# Patient Record
Sex: Male | Born: 1950 | Race: Black or African American | Hispanic: No | Marital: Married | Smoking: Never smoker
Health system: Southern US, Community
[De-identification: ages and names within clinical notes are randomized; demographics above are authoritative.]

## PROBLEM LIST (undated history)

## (undated) DIAGNOSIS — Z889 Allergy status to unspecified drugs, medicaments and biological substances status: Secondary | ICD-10-CM

## (undated) DIAGNOSIS — R42 Dizziness and giddiness: Secondary | ICD-10-CM

## (undated) DIAGNOSIS — F419 Anxiety disorder, unspecified: Secondary | ICD-10-CM

## (undated) DIAGNOSIS — I10 Essential (primary) hypertension: Secondary | ICD-10-CM

## (undated) DIAGNOSIS — R569 Unspecified convulsions: Secondary | ICD-10-CM

## (undated) HISTORY — DX: Dizziness and giddiness: R42

## (undated) HISTORY — DX: Anxiety disorder, unspecified: F41.9

## (undated) HISTORY — DX: Essential (primary) hypertension: I10

## (undated) HISTORY — DX: Allergy status to unspecified drugs, medicaments and biological substances: Z88.9

---

## 1997-12-03 ENCOUNTER — Encounter: Admission: RE | Admit: 1997-12-03 | Discharge: 1997-12-03 | Payer: Self-pay | Admitting: Family Medicine

## 1998-02-16 ENCOUNTER — Encounter: Admission: RE | Admit: 1998-02-16 | Discharge: 1998-02-16 | Payer: Self-pay | Admitting: Family Medicine

## 1998-11-20 ENCOUNTER — Encounter: Admission: RE | Admit: 1998-11-20 | Discharge: 1998-11-20 | Payer: Self-pay | Admitting: Family Medicine

## 1998-12-20 ENCOUNTER — Emergency Department (HOSPITAL_COMMUNITY): Admission: EM | Admit: 1998-12-20 | Discharge: 1998-12-20 | Payer: Self-pay | Admitting: Emergency Medicine

## 1998-12-22 ENCOUNTER — Encounter: Admission: RE | Admit: 1998-12-22 | Discharge: 1998-12-22 | Payer: Self-pay | Admitting: Sports Medicine

## 2000-04-16 ENCOUNTER — Emergency Department (HOSPITAL_COMMUNITY): Admission: EM | Admit: 2000-04-16 | Discharge: 2000-04-16 | Payer: Self-pay | Admitting: Emergency Medicine

## 2000-06-28 ENCOUNTER — Encounter: Payer: Self-pay | Admitting: Emergency Medicine

## 2000-06-28 ENCOUNTER — Emergency Department (HOSPITAL_COMMUNITY): Admission: EM | Admit: 2000-06-28 | Discharge: 2000-06-28 | Payer: Self-pay | Admitting: Emergency Medicine

## 2003-10-18 ENCOUNTER — Emergency Department (HOSPITAL_COMMUNITY): Admission: EM | Admit: 2003-10-18 | Discharge: 2003-10-18 | Payer: Self-pay | Admitting: *Deleted

## 2004-10-11 ENCOUNTER — Encounter: Admission: RE | Admit: 2004-10-11 | Discharge: 2004-10-11 | Payer: Self-pay | Admitting: Cardiology

## 2006-09-07 ENCOUNTER — Emergency Department: Payer: Self-pay

## 2006-11-03 ENCOUNTER — Emergency Department (HOSPITAL_COMMUNITY): Admission: EM | Admit: 2006-11-03 | Discharge: 2006-11-03 | Payer: Self-pay | Admitting: Emergency Medicine

## 2006-11-10 ENCOUNTER — Encounter: Admission: RE | Admit: 2006-11-10 | Discharge: 2006-11-10 | Payer: Self-pay | Admitting: Family Medicine

## 2007-01-01 ENCOUNTER — Ambulatory Visit: Payer: Self-pay | Admitting: Internal Medicine

## 2007-01-17 ENCOUNTER — Ambulatory Visit: Payer: Self-pay | Admitting: Internal Medicine

## 2008-11-01 ENCOUNTER — Emergency Department (HOSPITAL_COMMUNITY): Admission: EM | Admit: 2008-11-01 | Discharge: 2008-11-02 | Payer: Self-pay | Admitting: Emergency Medicine

## 2009-06-04 ENCOUNTER — Emergency Department (HOSPITAL_COMMUNITY): Admission: EM | Admit: 2009-06-04 | Discharge: 2009-06-04 | Payer: Self-pay | Admitting: Emergency Medicine

## 2009-07-03 ENCOUNTER — Emergency Department (HOSPITAL_COMMUNITY): Admission: EM | Admit: 2009-07-03 | Discharge: 2009-07-03 | Payer: Self-pay | Admitting: Family Medicine

## 2010-03-21 ENCOUNTER — Emergency Department (HOSPITAL_COMMUNITY): Admission: EM | Admit: 2010-03-21 | Discharge: 2010-03-21 | Payer: Self-pay | Admitting: Emergency Medicine

## 2010-09-09 LAB — URINALYSIS, ROUTINE W REFLEX MICROSCOPIC
Bilirubin Urine: NEGATIVE
Glucose, UA: NEGATIVE mg/dL
Hgb urine dipstick: NEGATIVE
Specific Gravity, Urine: 1.006 (ref 1.005–1.030)

## 2010-09-09 LAB — CBC
HCT: 43.8 % (ref 39.0–52.0)
MCHC: 33.8 g/dL (ref 30.0–36.0)
Platelets: 212 10*3/uL (ref 150–400)
RDW: 14.1 % (ref 11.5–15.5)

## 2010-09-09 LAB — BASIC METABOLIC PANEL
BUN: 11 mg/dL (ref 6–23)
Calcium: 8.7 mg/dL (ref 8.4–10.5)
Creatinine, Ser: 0.96 mg/dL (ref 0.4–1.5)
GFR calc non Af Amer: >60 mL/min (ref >60)
Glucose, Bld: 115 mg/dL — ABNORMAL HIGH (ref 70–99)

## 2010-09-09 LAB — URINE CULTURE
Colony Count: NO GROWTH
Culture  Setup Time: 201109260016
Culture: NO GROWTH

## 2010-09-09 LAB — POCT CARDIAC MARKERS: CKMB, poc: <1 ng/mL — ABNORMAL LOW (ref 1.0–8.0)

## 2010-09-09 LAB — DIFFERENTIAL
Basophils Absolute: 0 10*3/uL (ref 0.0–0.1)
Basophils Relative: 0 % (ref 0–1)
Monocytes Absolute: 0.6 10*3/uL (ref 0.1–1.0)
Neutro Abs: 6.2 10*3/uL (ref 1.7–7.7)
Neutrophils Relative %: 71 % (ref 43–77)

## 2010-09-28 LAB — DIFFERENTIAL
Basophils Absolute: 0 10*3/uL (ref 0.0–0.1)
Basophils Relative: 0 % (ref 0–1)
Eosinophils Absolute: 0.4 10*3/uL (ref 0.0–0.7)
Eosinophils Relative: 3 % (ref 0–5)
Lymphocytes Relative: 7 % — ABNORMAL LOW (ref 12–46)

## 2010-09-28 LAB — CBC
HCT: 47.6 % (ref 39.0–52.0)
MCV: 82.7 fL (ref 78.0–100.0)
Platelets: 259 10*3/uL (ref 150–400)
RDW: 14.9 % (ref 11.5–15.5)

## 2010-09-28 LAB — SEDIMENTATION RATE: Sed Rate: 6 mm/hr (ref 0–16)

## 2010-09-28 LAB — BASIC METABOLIC PANEL
BUN: 18 mg/dL (ref 6–23)
GFR calc non Af Amer: 59 mL/min — ABNORMAL LOW (ref >60)
Glucose, Bld: 101 mg/dL — ABNORMAL HIGH (ref 70–99)
Potassium: 3.7 mEq/L (ref 3.5–5.1)

## 2010-10-05 LAB — POCT CARDIAC MARKERS
Myoglobin, poc: 33.2 ng/mL (ref 12–200)
Troponin i, poc: <0.05 ng/mL (ref 0.00–0.09)

## 2010-10-05 LAB — POCT I-STAT, CHEM 8
BUN: 12 mg/dL (ref 6–23)
Chloride: 103 mEq/L (ref 96–112)
HCT: 45 % (ref 39.0–52.0)
Sodium: 138 mEq/L (ref 135–145)
TCO2: 25 mmol/L (ref 0–100)

## 2010-10-05 LAB — DIFFERENTIAL
Basophils Absolute: 0 10*3/uL (ref 0.0–0.1)
Lymphocytes Relative: 22 % (ref 12–46)
Neutro Abs: 3.4 10*3/uL (ref 1.7–7.7)

## 2010-10-05 LAB — CBC
Platelets: 220 10*3/uL (ref 150–400)
RDW: 14.1 % (ref 11.5–15.5)

## 2010-11-02 ENCOUNTER — Emergency Department (HOSPITAL_COMMUNITY): Payer: BC Managed Care – PPO

## 2010-11-02 ENCOUNTER — Emergency Department (HOSPITAL_COMMUNITY)
Admission: EM | Admit: 2010-11-02 | Discharge: 2010-11-02 | Disposition: A | Discharge status: Home / Self Care | Payer: BC Managed Care – PPO | Attending: Emergency Medicine | Admitting: Emergency Medicine

## 2010-11-02 DIAGNOSIS — I10 Essential (primary) hypertension: Secondary | ICD-10-CM | POA: Insufficient documentation

## 2010-11-02 DIAGNOSIS — Z79899 Other long term (current) drug therapy: Secondary | ICD-10-CM | POA: Insufficient documentation

## 2010-11-02 DIAGNOSIS — R0989 Other specified symptoms and signs involving the circulatory and respiratory systems: Secondary | ICD-10-CM | POA: Insufficient documentation

## 2010-11-02 DIAGNOSIS — E7800 Pure hypercholesterolemia, unspecified: Secondary | ICD-10-CM | POA: Insufficient documentation

## 2010-11-02 DIAGNOSIS — R42 Dizziness and giddiness: Secondary | ICD-10-CM | POA: Insufficient documentation

## 2010-11-02 LAB — URINALYSIS, ROUTINE W REFLEX MICROSCOPIC
Glucose, UA: NEGATIVE mg/dL
Ketones, ur: NEGATIVE mg/dL
Specific Gravity, Urine: 1.008 (ref 1.005–1.030)
pH: 7 (ref 5.0–8.0)

## 2010-11-02 LAB — DIFFERENTIAL
Basophils Absolute: 0 10*3/uL (ref 0.0–0.1)
Eosinophils Absolute: 0.1 10*3/uL (ref 0.0–0.7)
Eosinophils Relative: 2 % (ref 0–5)
Lymphocytes Relative: 22 % (ref 12–46)
Monocytes Absolute: 0.7 10*3/uL (ref 0.1–1.0)

## 2010-11-02 LAB — CBC
HCT: 45.4 % (ref 39.0–52.0)
MCHC: 34.1 g/dL (ref 30.0–36.0)
Platelets: 216 10*3/uL (ref 150–400)
RDW: 13.9 % (ref 11.5–15.5)

## 2010-11-02 LAB — BASIC METABOLIC PANEL
BUN: 12 mg/dL (ref 6–23)
Calcium: 9.5 mg/dL (ref 8.4–10.5)
GFR calc non Af Amer: >60 mL/min (ref >60)
Glucose, Bld: 101 mg/dL — ABNORMAL HIGH (ref 70–99)
Sodium: 137 mEq/L (ref 135–145)

## 2011-01-21 DIAGNOSIS — Z886 Allergy status to analgesic agent status: Secondary | ICD-10-CM | POA: Insufficient documentation

## 2011-01-21 DIAGNOSIS — R069 Unspecified abnormalities of breathing: Secondary | ICD-10-CM | POA: Insufficient documentation

## 2011-06-11 ENCOUNTER — Encounter: Payer: Self-pay | Admitting: Emergency Medicine

## 2011-06-11 ENCOUNTER — Emergency Department (HOSPITAL_COMMUNITY)
Admission: EM | Admit: 2011-06-11 | Discharge: 2011-06-11 | Disposition: A | Discharge status: Home / Self Care | Payer: BC Managed Care – PPO | Attending: Emergency Medicine | Admitting: Emergency Medicine

## 2011-06-11 ENCOUNTER — Other Ambulatory Visit: Payer: Self-pay

## 2011-06-11 ENCOUNTER — Emergency Department (HOSPITAL_COMMUNITY): Payer: BC Managed Care – PPO

## 2011-06-11 DIAGNOSIS — R002 Palpitations: Secondary | ICD-10-CM | POA: Insufficient documentation

## 2011-06-11 DIAGNOSIS — R141 Gas pain: Secondary | ICD-10-CM | POA: Insufficient documentation

## 2011-06-11 DIAGNOSIS — R142 Eructation: Secondary | ICD-10-CM | POA: Insufficient documentation

## 2011-06-11 DIAGNOSIS — Z79899 Other long term (current) drug therapy: Secondary | ICD-10-CM | POA: Insufficient documentation

## 2011-06-11 DIAGNOSIS — R143 Flatulence: Secondary | ICD-10-CM | POA: Insufficient documentation

## 2011-06-11 DIAGNOSIS — I10 Essential (primary) hypertension: Secondary | ICD-10-CM

## 2011-06-11 DIAGNOSIS — R0602 Shortness of breath: Secondary | ICD-10-CM | POA: Insufficient documentation

## 2011-06-11 HISTORY — DX: Essential (primary) hypertension: I10

## 2011-06-11 LAB — CBC
Hemoglobin: 14.7 g/dL (ref 13.0–17.0)
MCHC: 34.4 g/dL (ref 30.0–36.0)
RDW: 14.3 % (ref 11.5–15.5)
WBC: 6.3 10*3/uL (ref 4.0–10.5)

## 2011-06-11 LAB — POCT I-STAT TROPONIN I: Troponin i, poc: 0 ng/mL (ref 0.00–0.08)

## 2011-06-11 LAB — BASIC METABOLIC PANEL
GFR calc Af Amer: 81 mL/min — ABNORMAL LOW (ref >90)
GFR calc non Af Amer: 70 mL/min — ABNORMAL LOW (ref >90)
Potassium: 3.2 mEq/L — ABNORMAL LOW (ref 3.5–5.1)
Sodium: 136 mEq/L (ref 135–145)

## 2011-06-11 MED ORDER — ATENOLOL 25 MG PO TABS
25.0000 mg | ORAL_TABLET | Freq: Once | ORAL | Status: AC
  Administered 2011-06-11: 25 mg via ORAL
  Filled 2011-06-11: qty 1

## 2011-06-11 MED ORDER — POTASSIUM CHLORIDE CRYS ER 20 MEQ PO TBCR
40.0000 meq | EXTENDED_RELEASE_TABLET | Freq: Once | ORAL | Status: AC
  Administered 2011-06-11: 40 meq via ORAL
  Filled 2011-06-11: qty 2

## 2011-06-11 MED ORDER — ATENOLOL 25 MG PO TABS: 25.0000 mg | ORAL_TABLET | Freq: Every day | ORAL | Status: DC

## 2011-06-11 NOTE — ED Notes (Signed)
Family at bedside. 

## 2011-06-11 NOTE — ED Notes (Signed)
MD at bedside. 

## 2011-06-11 NOTE — ED Notes (Signed)
Pt. Believes he is on the wrong BP meds.  ANd this am his BP was elevated and he felt dizzy and sob.

## 2011-06-11 NOTE — ED Notes (Signed)
Patient is resting comfortably. 

## 2011-06-11 NOTE — ED Provider Notes (Signed)
History     CSN: [id number] Arrival date & time: 06/11/2011  9:29 AM   First MD Initiated Contact with Patient 06/11/11 [phone]      Chief Complaint  Patient presents with  . Hypertension    (Consider location/radiation/quality/duration/timing/severity/associated sxs/prior treatment) HPI History provided by pt.  Pt a poor historian.  Pt has h/o HTN for approx 4 yrs.  Has been on lisinopril for a long time.  Per prior chart, was treated for HTN in ED in 10/2010 and d/c'd home w/ atenolol to supplement lisinopril.  Just filled this prescription last week.  BP was better controlled (based on home BP cuff) for a short time but his physician at [facility] d/c'd this medication and prescribed HCTZ instead.  Started HCTZ 2 days ago and simultaneously developed worse than baseline SOB, palpitations described as heart beating heavily and increased gas.  Denies dizziness, headache, vision changes, chest pain, abd pain, urinary symptoms.   Pt says that he is seen at [facility] for allergies and "a breathing problem; he does not get enough oxygen".  He has never been diagnosed w/ a pulmonary disease.   Past Medical History  Diagnosis Date  . Hypertension     History reviewed. No pertinent past surgical history.  History reviewed. No pertinent family history.  History  Substance Use Topics  . Smoking status: Never Smoker   . Smokeless tobacco: Not on file  . Alcohol Use: No      Review of Systems  All other systems reviewed and are negative.    Allergies  Amlodipine; Amphetamines; Aspirin; Dextroamphetamine; Felodipine; and Indapamide  Home Medications   Current Outpatient Rx  Name Route Sig Dispense Refill  . CLONAZEPAM 0.5 MG PO TABS Oral Take 0.5 mg by mouth 2 (two) times daily as needed. For anxiety.     Marland Kitchen FLUTICASONE PROPIONATE 50 MCG/ACT NA SUSP Nasal Place 2 sprays into the nose daily.      Marland Kitchen HYDROCHLOROTHIAZIDE 25 MG PO TABS Oral Take 25 mg by mouth daily.      Marland Kitchen LISINOPRIL 20 MG PO  TABS Oral Take 20 mg by mouth 2 (two) times daily.        BP 198/124  Pulse 85  Temp(Src) 97.7 F (36.5 C) (Oral)  Resp 18  Ht 5\' 9"  (1.753 m)  Wt 220 lb (99.791 kg)  BMI 32.49 kg/m2  SpO2 98%  Physical Exam  Nursing note and vitals reviewed. Constitutional: He is oriented to person, place, and time. He appears well-developed and well-nourished. No distress.  HENT:  Head: Normocephalic and atraumatic.  Eyes:       Normal appearance  Neck: Normal range of motion.  Cardiovascular: Normal rate and regular rhythm.        Hypertensive  Pulmonary/Chest: Effort normal and breath sounds normal. No respiratory distress.  Abdominal: Soft. He exhibits no distension. There is no tenderness.  Musculoskeletal: He exhibits no edema and no tenderness.  Neurological: He is alert and oriented to person, place, and time.  Skin: Skin is warm and dry. No rash noted.  Psychiatric: He has a normal mood and affect. His behavior is normal.    ED Course  Procedures (including critical care time)  Date: 06/11/2011  Rate: 74  Rhythm: normal sinus rhythm  QRS Axis: normal  Intervals: normal  ST/T Wave abnormalities: normal  Conduction Disutrbances:none  Narrative Interpretation:   Old EKG Reviewed: none available   Labs Reviewed  BASIC METABOLIC PANEL - Abnormal; Notable for the following:  Potassium 3.2 (*)    GFR calc non Af Amer 70 (*)    GFR calc Af Amer 81 (*)    All other components within normal limits  CBC  PRO B NATRIURETIC PEPTIDE  POCT I-STAT TROPONIN I   Dg Chest 2 View  06/11/2011  *RADIOLOGY REPORT*  Clinical Data: Shortness of breath.  CHEST - 2 VIEW  Comparison: PA and lateral chest 11/02/2010 and 03/21/2010.  Findings: Punctate calcified left lower lobe granuloma is again seen.  Lungs otherwise clear.  Heart size is normal.  No pneumothorax or effusion.  Remote left sixth rib fracture is noted.  IMPRESSION: No acute finding.  Stable compared to prior exam.  Original  Report Authenticated By: Bernadene Bell. D'ALESSIO, M.D.     1. Hypertension       MDM  Pt presents w/ HTN and c/o worse than baseline SOB x 2 days.  Further history revealed that "SOB" was actually nasal congestion.  Breathing improves when he is outdoors working in the yard.  BP 203/103.  Improved to 172/100 w/ po atenolol.  No sx or exam findings/labs/imagine suggestive of hypertensive crisis.  Pt d/c'd home w/ 2 week course of atenolol and advised to continue lisinopril and hctz as prescribed, as well as f/u with either Dr. Parke Simmers or physician at [facility] this week for BP recheck.  Return precautions discussed.         Otilio Miu, [place] 06/11/11 [phone]

## 2011-06-12 NOTE — ED Provider Notes (Signed)
Medical screening examination/treatment/procedure(s) were performed by non-physician practitioner and as supervising physician I was immediately available for consultation/collaboration.  Celene Kras, MD 06/12/11 [phone]

## 2011-07-04 ENCOUNTER — Encounter (HOSPITAL_COMMUNITY): Payer: Self-pay | Admitting: *Deleted

## 2011-07-04 ENCOUNTER — Emergency Department (HOSPITAL_COMMUNITY)
Admission: EM | Admit: 2011-07-04 | Discharge: 2011-07-05 | Disposition: A | Discharge status: Home / Self Care | Payer: BC Managed Care – PPO | Attending: Orthopedic Surgery | Admitting: Orthopedic Surgery

## 2011-07-04 DIAGNOSIS — R569 Unspecified convulsions: Secondary | ICD-10-CM | POA: Insufficient documentation

## 2011-07-04 DIAGNOSIS — J3489 Other specified disorders of nose and nasal sinuses: Secondary | ICD-10-CM | POA: Insufficient documentation

## 2011-07-04 DIAGNOSIS — I10 Essential (primary) hypertension: Secondary | ICD-10-CM | POA: Insufficient documentation

## 2011-07-04 DIAGNOSIS — F29 Unspecified psychosis not due to a substance or known physiological condition: Secondary | ICD-10-CM | POA: Insufficient documentation

## 2011-07-04 DIAGNOSIS — R0981 Nasal congestion: Secondary | ICD-10-CM

## 2011-07-04 LAB — DIFFERENTIAL
Basophils Absolute: 0 10*3/uL (ref 0.0–0.1)
Basophils Relative: 0 % (ref 0–1)
Monocytes Absolute: 0.9 10*3/uL (ref 0.1–1.0)
Neutro Abs: 10.9 10*3/uL — ABNORMAL HIGH (ref 1.7–7.7)
Neutrophils Relative %: 83 % — ABNORMAL HIGH (ref 43–77)

## 2011-07-04 LAB — CBC
MCHC: 34.9 g/dL (ref 30.0–36.0)
Platelets: 209 10*3/uL (ref 150–400)
RDW: 14.1 % (ref 11.5–15.5)

## 2011-07-04 LAB — URINE MICROSCOPIC-ADD ON

## 2011-07-04 LAB — URINALYSIS, ROUTINE W REFLEX MICROSCOPIC
Bilirubin Urine: NEGATIVE
Nitrite: NEGATIVE
Specific Gravity, Urine: 1.012 (ref 1.005–1.030)
Urobilinogen, UA: 0.2 mg/dL (ref 0.0–1.0)

## 2011-07-04 LAB — BASIC METABOLIC PANEL
BUN: 20 mg/dL (ref 6–23)
GFR calc Af Amer: 71 mL/min — ABNORMAL LOW (ref >90)
GFR calc non Af Amer: 61 mL/min — ABNORMAL LOW (ref >90)
Potassium: 3.2 mEq/L — ABNORMAL LOW (ref 3.5–5.1)

## 2011-07-04 LAB — ETHANOL: Alcohol, Ethyl (B): <11 mg/dL (ref 0–11)

## 2011-07-04 LAB — RAPID URINE DRUG SCREEN, HOSP PERFORMED: Benzodiazepines: NOT DETECTED

## 2011-07-04 MED ORDER — CLONAZEPAM 0.5 MG PO TABS
0.5000 mg | ORAL_TABLET | Freq: Once | ORAL | Status: AC
  Administered 2011-07-04: 0.5 mg via ORAL
  Filled 2011-07-04: qty 1

## 2011-07-04 MED ORDER — SODIUM CHLORIDE 0.9 % IV SOLN
INTRAVENOUS | Status: DC
  Administered 2011-07-04: 21:00:00 via INTRAVENOUS

## 2011-07-04 NOTE — ED Notes (Signed)
Per EMS:  Pt had 12 minute long seizure followed by a 25 minute long post-ictal state.  Typical seizure and post-ictal state for him lasts less than 1 minute, pt in quadrigeminy.  CBG 124.  Pt was on bed when it started, wife assisted pt to floor.  Pt alert and oriented upon arrival.  Pt used afrin just prior to seizure.  Pt not on treatment for seizures, st's the seizure meds have caused him to have more seizures in the past.

## 2011-07-04 NOTE — ED Provider Notes (Signed)
History     CSN: [id number]  Arrival date & time 07/04/11  2038   First MD Initiated Contact with Patient 07/04/11 2051      Chief Complaint  Patient presents with  . Seizures    (Consider location/radiation/quality/duration/timing/severity/associated sxs/prior treatment) Patient is a 61 y.o. male presenting with seizures. The history is provided by the patient and the spouse.  Seizures  This is a recurrent problem. The current episode started 1 to 2 hours ago (Patient had nasal congestion, then used Flonase, then had a seizure). The problem has been rapidly improving. There was 1 seizure. The most recent episode lasted more than 5 minutes. Associated symptoms include confusion. Characteristics include rhythmic jerking, loss of consciousness and bit tongue. Characteristics do not include cyanosis. The episode was witnessed. The seizure(s) had left-sided and right-sided focality. Possible causes do not include med or dosage change or sleep deprivation. There has been no fever.   The patient was assisted to the floor by his wife, who kept him sitting against the bed, to prevent him from falling. After he stopped shaking. He was sleeping then awoke to a confused state in about 7 minutes. No recent seizure. He was evaluated for seizure disorder and treated after "borderline EEG." But could not tolerate medications that were given; they caused more seizures.  Past Medical History  Diagnosis Date  . Hypertension     History reviewed. No pertinent past surgical history.  No family history on file.  History  Substance Use Topics  . Smoking status: Never Smoker   . Smokeless tobacco: Not on file  . Alcohol Use: No      Review of Systems  Cardiovascular: Negative for cyanosis.  Neurological: Positive for seizures and loss of consciousness.  Psychiatric/Behavioral: Positive for confusion.  All other systems reviewed and are negative.    Allergies  Amlodipine; Amphetamines; Aspirin;  Dextroamphetamine; Felodipine; and Indapamide  Home Medications   Current Outpatient Rx  Name Route Sig Dispense Refill  . ATENOLOL 25 MG PO TABS Oral Take 1 tablet (25 mg total) by mouth daily. 14 tablet 0  . VITAMIN D 1000 UNITS PO TABS Oral Take 1,000 Units by mouth daily.      Marland Kitchen CLONAZEPAM 0.5 MG PO TABS Oral Take 0.5 mg by mouth 2 (two) times daily as needed. For anxiety.     Marland Kitchen FLUTICASONE PROPIONATE 50 MCG/ACT NA SUSP Nasal Place 2 sprays into the nose daily.      Marland Kitchen HYDROCHLOROTHIAZIDE 25 MG PO TABS Oral Take 25 mg by mouth daily.      Marland Kitchen LISINOPRIL 20 MG PO TABS Oral Take 20 mg by mouth 2 (two) times daily.      Marland Kitchen POTASSIUM CHLORIDE CRYS ER 20 MEQ PO TBCR Oral Take 20 mEq by mouth daily.        BP 165/88  Pulse 83  Temp(Src) 99.3 F (37.4 C) (Oral)  Resp 17  SpO2 99%  Physical Exam  Nursing note and vitals reviewed. Constitutional: He is oriented to person, place, and time. He appears well-developed and well-nourished.  HENT:  Head: Normocephalic and atraumatic.  Right Ear: External ear normal.  Left Ear: External ear normal.       Small contusion and abrasion of the distal tongue  Eyes: Conjunctivae and EOM are normal. Pupils are equal, round, and reactive to light.  Neck: Normal range of motion and phonation normal. Neck supple.  Cardiovascular: Normal rate, regular rhythm, normal heart sounds and intact distal pulses.  Pulmonary/Chest: Effort normal and breath sounds normal. He exhibits no bony tenderness.  Abdominal: Soft. Normal appearance. There is no tenderness.  Musculoskeletal: Normal range of motion.  Neurological: He is alert and oriented to person, place, and time. He has normal strength. No cranial nerve deficit or sensory deficit. He exhibits normal muscle tone. Coordination normal.  Skin: Skin is warm, dry and intact.  Psychiatric: He has a normal mood and affect. His behavior is normal. Judgment and thought content normal.    ED Course  Procedures  (including critical care time)  Labs Reviewed  CBC - Abnormal; Notable for the following:    WBC 13.2 (*)    All other components within normal limits  DIFFERENTIAL - Abnormal; Notable for the following:    Neutrophils Relative 83 (*)    Neutro Abs 10.9 (*)    Lymphocytes Relative 8 (*)    All other components within normal limits  URINALYSIS, ROUTINE W REFLEX MICROSCOPIC - Abnormal; Notable for the following:    Hgb urine dipstick SMALL (*)    Protein, ur 100 (*)    All other components within normal limits  BASIC METABOLIC PANEL - Abnormal; Notable for the following:    Potassium 3.2 (*)    Glucose, Bld 141 (*)    GFR calc non Af Amer 61 (*)    GFR calc Af Amer 71 (*)    All other components within normal limits  URINE MICROSCOPIC-ADD ON - Abnormal; Notable for the following:    Casts HYALINE CASTS (*)    All other components within normal limits  URINE RAPID DRUG SCREEN (HOSP PERFORMED)  ETHANOL   ReevaL: No seizure in the emergency department. Patient states he has seen neurologists at every Medical Center within 100 miles of [place]. He states he has tried all the seizure medicines that are available. He believes he has used. He's never used a seizure medicine helped a seizures, they all made the seizures worse. He additionally has intolerances of many medications, primarily due to their preservatives. He would like to be referred to a neurologist in [place] and a new primary care physician.  1. Seizure   2. Nasal congestion       MDM  Apparent generalized seizure tonight with history of same frequently, this one being worse than others. He is intolerant of all previously tried seizure medicines, which apparently have spanned the spectrum of available medicines. He has seen many neurologists at many different centers and not arrived to treatment that  is useful for him. At this point he is stable for discharge with outpatient management.        Flint Melter,  MD 07/04/11 [phone]

## 2011-07-04 NOTE — ED Notes (Signed)
EAV:[id number]> Expected date:07/04/11<BR> Expected time: 8:33 PM<BR> Means of arrival:Ambulance<BR> Comments:<BR> M65 - 32yoM MS s/s

## 2011-07-04 NOTE — ED Notes (Signed)
Patient aware of need for urine specimen. Patient unable to void at this time. Patient given urinal. Encouraged to call for assistance if needed.   

## 2012-05-07 DIAGNOSIS — R42 Dizziness and giddiness: Secondary | ICD-10-CM | POA: Insufficient documentation

## 2012-07-27 ENCOUNTER — Encounter (HOSPITAL_COMMUNITY): Payer: Self-pay | Admitting: Emergency Medicine

## 2012-07-27 ENCOUNTER — Observation Stay (HOSPITAL_COMMUNITY)
Admission: EM | Admit: 2012-07-27 | Discharge: 2012-07-28 | Disposition: A | Discharge status: Home / Self Care | Payer: Medicare Other | Attending: Internal Medicine | Admitting: Internal Medicine

## 2012-07-27 ENCOUNTER — Emergency Department (HOSPITAL_COMMUNITY): Payer: Medicare Other

## 2012-07-27 DIAGNOSIS — J310 Chronic rhinitis: Secondary | ICD-10-CM | POA: Insufficient documentation

## 2012-07-27 DIAGNOSIS — G4733 Obstructive sleep apnea (adult) (pediatric): Secondary | ICD-10-CM

## 2012-07-27 DIAGNOSIS — F411 Generalized anxiety disorder: Secondary | ICD-10-CM

## 2012-07-27 DIAGNOSIS — I10 Essential (primary) hypertension: Secondary | ICD-10-CM

## 2012-07-27 DIAGNOSIS — F445 Conversion disorder with seizures or convulsions: Secondary | ICD-10-CM | POA: Diagnosis present

## 2012-07-27 DIAGNOSIS — R404 Transient alteration of awareness: Principal | ICD-10-CM | POA: Insufficient documentation

## 2012-07-27 DIAGNOSIS — R569 Unspecified convulsions: Secondary | ICD-10-CM | POA: Insufficient documentation

## 2012-07-27 DIAGNOSIS — F419 Anxiety disorder, unspecified: Secondary | ICD-10-CM | POA: Diagnosis present

## 2012-07-27 DIAGNOSIS — J309 Allergic rhinitis, unspecified: Secondary | ICD-10-CM

## 2012-07-27 DIAGNOSIS — R4182 Altered mental status, unspecified: Secondary | ICD-10-CM

## 2012-07-27 DIAGNOSIS — R55 Syncope and collapse: Secondary | ICD-10-CM

## 2012-07-27 LAB — CBC WITH DIFFERENTIAL/PLATELET
Basophils Absolute: 0 10*3/uL (ref 0.0–0.1)
HCT: 47.6 % (ref 39.0–52.0)
Hemoglobin: 16.5 g/dL (ref 13.0–17.0)
Lymphocytes Relative: 9 % — ABNORMAL LOW (ref 12–46)
Monocytes Absolute: 1.2 10*3/uL — ABNORMAL HIGH (ref 0.1–1.0)
Monocytes Relative: 9 % (ref 3–12)
Neutro Abs: 10.5 10*3/uL — ABNORMAL HIGH (ref 1.7–7.7)
WBC: 12.9 10*3/uL — ABNORMAL HIGH (ref 4.0–10.5)

## 2012-07-27 LAB — COMPREHENSIVE METABOLIC PANEL
AST: 29 U/L (ref 0–37)
Alkaline Phosphatase: 84 U/L (ref 39–117)
BUN: 9 mg/dL (ref 6–23)
CO2: 26 mEq/L (ref 19–32)
Chloride: 101 mEq/L (ref 96–112)
Creatinine, Ser: 1.11 mg/dL (ref 0.50–1.35)
GFR calc non Af Amer: 69 mL/min — ABNORMAL LOW (ref >90)
Total Bilirubin: 0.4 mg/dL (ref 0.3–1.2)

## 2012-07-27 MED ORDER — SODIUM CHLORIDE 0.9 % IV SOLN
Freq: Once | INTRAVENOUS | Status: AC
  Administered 2012-07-27: 23:00:00 via INTRAVENOUS

## 2012-07-27 NOTE — ED Notes (Signed)
Pt reports high blood pressure tonight at home.  States he last took blood pressure medication last night.  While triaging pt he started shaking head and then became unresponsive and drooling for approx 1 min.  Pt then became combative, not following commands, and trying to get up.  RNs x 3 assisted pt into wheelchair and pt took to Pod A 5. Pt now alert.  Elliot Gurney, RN at bedside and EDP [place] notified of pt.  Wife brought to bedside and reports similar episodes in the past that pt has been seen at [facility] for with ? Seizure diagnosis.  No incontinence noted. Pt did go to restroom prior to triage.

## 2012-07-27 NOTE — ED Notes (Signed)
Pt has had four syncopal episodes today prior to arrival; pt's wife reports syncopal episodes occuring at 8:53 am, 1:53 pm, 7:47 pm and 8:30 pm; wife reports pts BP medication lisinopril recently being changed; pt has hx of syncopal episodes and wife reports pt having syncopal episodes that occur for about a week straight and then several days without a syncopal episode; upon assessment pt was diaphoretic and warm to touch; pt did have temperature at triage;

## 2012-07-28 ENCOUNTER — Encounter (HOSPITAL_COMMUNITY): Payer: Self-pay | Admitting: Internal Medicine

## 2012-07-28 ENCOUNTER — Emergency Department (HOSPITAL_COMMUNITY): Payer: Medicare Other

## 2012-07-28 DIAGNOSIS — R4182 Altered mental status, unspecified: Secondary | ICD-10-CM

## 2012-07-28 DIAGNOSIS — F411 Generalized anxiety disorder: Secondary | ICD-10-CM

## 2012-07-28 DIAGNOSIS — F419 Anxiety disorder, unspecified: Secondary | ICD-10-CM | POA: Diagnosis present

## 2012-07-28 DIAGNOSIS — F445 Conversion disorder with seizures or convulsions: Secondary | ICD-10-CM | POA: Diagnosis present

## 2012-07-28 DIAGNOSIS — I10 Essential (primary) hypertension: Secondary | ICD-10-CM | POA: Diagnosis present

## 2012-07-28 DIAGNOSIS — J309 Allergic rhinitis, unspecified: Secondary | ICD-10-CM

## 2012-07-28 DIAGNOSIS — F064 Anxiety disorder due to known physiological condition: Secondary | ICD-10-CM | POA: Insufficient documentation

## 2012-07-28 DIAGNOSIS — G4733 Obstructive sleep apnea (adult) (pediatric): Secondary | ICD-10-CM

## 2012-07-28 DIAGNOSIS — R55 Syncope and collapse: Secondary | ICD-10-CM | POA: Diagnosis present

## 2012-07-28 LAB — COMPREHENSIVE METABOLIC PANEL
Albumin: 3.5 g/dL (ref 3.5–5.2)
Alkaline Phosphatase: 70 U/L (ref 39–117)
BUN: 9 mg/dL (ref 6–23)
Calcium: 8.6 mg/dL (ref 8.4–10.5)
Creatinine, Ser: 1.11 mg/dL (ref 0.50–1.35)
GFR calc Af Amer: 80 mL/min — ABNORMAL LOW (ref >90)
Glucose, Bld: 110 mg/dL — ABNORMAL HIGH (ref 70–99)
Potassium: 3.6 mEq/L (ref 3.5–5.1)
Total Protein: 6.6 g/dL (ref 6.0–8.3)

## 2012-07-28 LAB — GLUCOSE, CAPILLARY
Glucose-Capillary: 103 mg/dL — ABNORMAL HIGH (ref 70–99)
Glucose-Capillary: 107 mg/dL — ABNORMAL HIGH (ref 70–99)

## 2012-07-28 LAB — CBC
HCT: 43.2 % (ref 39.0–52.0)
Hemoglobin: 14.7 g/dL (ref 13.0–17.0)
MCV: 81.4 fL (ref 78.0–100.0)
RBC: 5.31 MIL/uL (ref 4.22–5.81)
RDW: 14.2 % (ref 11.5–15.5)
WBC: 10.9 10*3/uL — ABNORMAL HIGH (ref 4.0–10.5)

## 2012-07-28 LAB — HEMOGLOBIN A1C: Mean Plasma Glucose: 123 mg/dL — ABNORMAL HIGH (ref <117)

## 2012-07-28 LAB — TROPONIN I: Troponin I: <0.3 ng/mL (ref <0.30)

## 2012-07-28 LAB — MAGNESIUM: Magnesium: 2.3 mg/dL (ref 1.5–2.5)

## 2012-07-28 LAB — PHOSPHORUS: Phosphorus: 3.8 mg/dL (ref 2.3–4.6)

## 2012-07-28 MED ORDER — SALINE SPRAY 0.65 % NA SOLN: 1.0000 | NASAL | Status: DC | PRN

## 2012-07-28 MED ORDER — SODIUM CHLORIDE 0.9 % IV BOLUS (SEPSIS)
500.0000 mL | Freq: Once | INTRAVENOUS | Status: AC
  Administered 2012-07-28: 500 mL via INTRAVENOUS

## 2012-07-28 MED ORDER — SODIUM CHLORIDE 0.9 % IJ SOLN
3.0000 mL | Freq: Two times a day (BID) | INTRAMUSCULAR | Status: DC
  Administered 2012-07-28: 3 mL via INTRAVENOUS

## 2012-07-28 MED ORDER — ACETAMINOPHEN 650 MG RE SUPP: 650.0000 mg | Freq: Four times a day (QID) | RECTAL | Status: DC | PRN

## 2012-07-28 MED ORDER — METOPROLOL TARTRATE 25 MG PO TABS
25.0000 mg | ORAL_TABLET | Freq: Two times a day (BID) | ORAL | Status: DC
  Administered 2012-07-28 (×2): 25 mg via ORAL
  Filled 2012-07-28 (×3): qty 1

## 2012-07-28 MED ORDER — LISINOPRIL 20 MG PO TABS
20.0000 mg | ORAL_TABLET | Freq: Two times a day (BID) | ORAL | Status: DC
  Administered 2012-07-28 (×2): 20 mg via ORAL
  Filled 2012-07-28 (×3): qty 1

## 2012-07-28 MED ORDER — ACETAMINOPHEN 325 MG PO TABS: 650.0000 mg | ORAL_TABLET | Freq: Four times a day (QID) | ORAL | Status: DC | PRN

## 2012-07-28 MED ORDER — SODIUM CHLORIDE 0.9 % IV SOLN
INTRAVENOUS | Status: AC
  Administered 2012-07-28: 04:00:00 via INTRAVENOUS

## 2012-07-28 MED ORDER — HYDROCODONE-ACETAMINOPHEN 5-325 MG PO TABS: 1.0000 | ORAL_TABLET | ORAL | Status: DC | PRN

## 2012-07-28 MED ORDER — BUDESONIDE 32 MCG/ACT NA SUSP: 1.0000 | Freq: Every day | NASAL | Status: DC

## 2012-07-28 MED ORDER — SODIUM CHLORIDE 0.9 % IV SOLN: INTRAVENOUS | Status: DC

## 2012-07-28 MED ORDER — ONDANSETRON HCL 4 MG/2ML IJ SOLN: 4.0000 mg | Freq: Four times a day (QID) | INTRAMUSCULAR | Status: DC | PRN

## 2012-07-28 MED ORDER — CLONIDINE HCL 0.1 MG PO TABS
0.1000 mg | ORAL_TABLET | Freq: Once | ORAL | Status: AC
  Administered 2012-07-28: 0.1 mg via ORAL
  Filled 2012-07-28: qty 1

## 2012-07-28 MED ORDER — POTASSIUM CHLORIDE CRYS ER 20 MEQ PO TBCR
20.0000 meq | EXTENDED_RELEASE_TABLET | Freq: Every day | ORAL | Status: DC
  Administered 2012-07-28: 20 meq via ORAL
  Filled 2012-07-28: qty 1

## 2012-07-28 MED ORDER — DOCUSATE SODIUM 100 MG PO CAPS
100.0000 mg | ORAL_CAPSULE | Freq: Two times a day (BID) | ORAL | Status: DC
Start: 2012-07-28 — End: 2012-07-28
  Administered 2012-07-28: 100 mg via ORAL
  Filled 2012-07-28 (×2): qty 1

## 2012-07-28 MED ORDER — ONDANSETRON HCL 4 MG PO TABS: 4.0000 mg | ORAL_TABLET | Freq: Four times a day (QID) | ORAL | Status: DC | PRN

## 2012-07-28 MED ORDER — CLONAZEPAM 0.5 MG PO TABS: 0.5000 mg | ORAL_TABLET | Freq: Two times a day (BID) | ORAL | Status: DC | PRN

## 2012-07-28 NOTE — ED Notes (Signed)
PA at bedside.

## 2012-07-28 NOTE — ED Provider Notes (Signed)
History     CSN: [id number]  Arrival date & time 07/27/12  2153   First MD Initiated Contact with Patient 07/27/12 2221      Chief Complaint  Patient presents with  . Hypertension  . Altered Mental Status    (Consider location/radiation/quality/duration/timing/severity/associated sxs/prior treatment) Patient is a 62 y.o. male presenting with hypertension and altered mental status. The history is provided by the patient and the spouse.  Hypertension This is a chronic problem. Pertinent negatives include no abdominal pain, arthralgias, chest pain, congestion, coughing, fever, myalgias, nausea, numbness, rash, sore throat or vomiting. Associated symptoms comments: Per his wife, the patient has a longstanding history of episodes lasting 1-2 minutes that involve the patient passing out. She states that there is no seizure activity but that the right arm draws, and there is always a period of confusion afterward that lasts for several minutes. No urinary incontinence. He has had tongue biting in the past. He is followed at [facility] where the wife reports negative EEG and treatment that currently focuses on the use of a CPAP at night that is complicated by patient's anxiety over using the mask. He presents today after 5 episodes similar in nature to previous with the concern that frequency is increasing and she is afraid for the patient's safety and likelihood of injury from falling with loss of consciousness episode The patient does not complain of pain, N, V, change in appetite, recent fever, chest pain, cough or shortness of breath. .  Altered Mental Status Pertinent negatives include no abdominal pain, arthralgias, chest pain, congestion, coughing, fever, myalgias, nausea, numbness, rash, sore throat or vomiting.    Past Medical History  Diagnosis Date  . Hypertension     History reviewed. No pertinent past surgical history.  No family history on file.  History  Substance Use  Topics  . Smoking status: Never Smoker   . Smokeless tobacco: Not on file  . Alcohol Use: No      Review of Systems  Constitutional: Negative for fever and appetite change.  HENT: Negative for congestion, sore throat and trouble swallowing.   Respiratory: Negative for cough and shortness of breath.   Cardiovascular: Negative for chest pain.  Gastrointestinal: Negative for nausea, vomiting and abdominal pain.  Genitourinary: Negative for dysuria.  Musculoskeletal: Negative for myalgias and arthralgias.  Skin: Negative for rash.  Neurological: Positive for syncope. Negative for tremors, facial asymmetry and numbness.  Hematological: Does not bruise/bleed easily.  Psychiatric/Behavioral: Positive for confusion and altered mental status.    Allergies  Amlodipine; Amphetamines; Aspirin; Dextroamphetamine; Felodipine; and Indapamide  Home Medications   Current Outpatient Rx  Name  Route  Sig  Dispense  Refill  . CLONAZEPAM 0.5 MG PO TABS   Oral   Take 0.5 mg by mouth 2 (two) times daily as needed. For anxiety.          Marland Kitchen LISINOPRIL 20 MG PO TABS   Oral   Take 20 mg by mouth 2 (two) times daily.           Marland Kitchen POTASSIUM CHLORIDE CRYS ER 20 MEQ PO TBCR   Oral   Take 20 mEq by mouth daily.           . SODIUM CHLORIDE 0.65 % NA SOLN   Nasal   Place 1 spray into the nose as needed. For congestion           BP 174/114  Pulse 107  Temp 99.9 F (37.7 C) (Oral)  Resp 16  SpO2 94%  Physical Exam  Constitutional: He is oriented to person, place, and time. He appears well-developed and well-nourished.       Patient is somnolent but wakes easily and answers questions with appropriate orientation.  HENT:  Head: Normocephalic and atraumatic.  Eyes: EOM are normal. Pupils are equal, round, and reactive to light.  Neck: Normal range of motion.  Cardiovascular: Normal rate and regular rhythm.   No murmur heard. Pulmonary/Chest: Effort normal and breath sounds normal. He  has no wheezes. He has no rales.  Abdominal: Soft. Bowel sounds are normal. There is no tenderness.  Musculoskeletal: Normal range of motion. He exhibits no edema.  Neurological: He is alert and oriented to person, place, and time. He has normal strength and normal reflexes. No sensory deficit. He displays a negative Romberg sign. Coordination normal.    ED Course  Procedures (including critical care time)  Labs Reviewed  CBC WITH DIFFERENTIAL - Abnormal; Notable for the following:    WBC 12.9 (*)     RBC 5.89 (*)     Neutrophils Relative 82 (*)     Neutro Abs 10.5 (*)     Lymphocytes Relative 9 (*)     Monocytes Absolute 1.2 (*)     All other components within normal limits  COMPREHENSIVE METABOLIC PANEL - Abnormal; Notable for the following:    Glucose, Bld 121 (*)     GFR calc non Af Amer 69 (*)     GFR calc Af Amer 80 (*)     All other components within normal limits  TROPONIN I  URINALYSIS, ROUTINE W REFLEX MICROSCOPIC   Results for orders placed during the hospital encounter of 07/27/12  CBC WITH DIFFERENTIAL      Component Value Range   WBC 12.9 (*) 4.0 - 10.5 K/uL   RBC 5.89 (*) 4.22 - 5.81 MIL/uL   Hemoglobin 16.5  13.0 - 17.0 g/dL   HCT 40.9  81.1 - 91.4 %   MCV 80.8  78.0 - 100.0 fL   MCH 28.0  26.0 - 34.0 pg   MCHC 34.7  30.0 - 36.0 g/dL   RDW 78.2  95.6 - 21.3 %   Platelets 209  150 - 400 K/uL   Neutrophils Relative 82 (*) 43 - 77 %   Neutro Abs 10.5 (*) 1.7 - 7.7 K/uL   Lymphocytes Relative 9 (*) 12 - 46 %   Lymphs Abs 1.1  0.7 - 4.0 K/uL   Monocytes Relative 9  3 - 12 %   Monocytes Absolute 1.2 (*) 0.1 - 1.0 K/uL   Eosinophils Relative 0  0 - 5 %   Eosinophils Absolute 0.1  0.0 - 0.7 K/uL   Basophils Relative 0  0 - 1 %   Basophils Absolute 0.0  0.0 - 0.1 K/uL  COMPREHENSIVE METABOLIC PANEL      Component Value Range   Sodium 137  135 - 145 mEq/L   Potassium 3.6  3.5 - 5.1 mEq/L   Chloride 101  96 - 112 mEq/L   CO2 26  19 - 32 mEq/L   Glucose, Bld  121 (*) 70 - 99 mg/dL   BUN 9  6 - 23 mg/dL   Creatinine, Ser 0.86  0.50 - 1.35 mg/dL   Calcium 9.1  8.4 - 57.8 mg/dL   Total Protein 8.0  6.0 - 8.3 g/dL   Albumin 4.2  3.5 - 5.2 g/dL   AST 29  0 -  37 U/L   ALT 26  0 - 53 U/L   Alkaline Phosphatase 84  39 - 117 U/L   Total Bilirubin 0.4  0.3 - 1.2 mg/dL   GFR calc non Af Amer 69 (*) >90 mL/min   GFR calc Af Amer 80 (*) >90 mL/min  TROPONIN I      Component Value Range   Troponin I <0.30  <0.30 ng/mL    Dg Chest Port 1 View  07/27/2012  *RADIOLOGY REPORT*  Clinical Data: Syncope.  Diaphoresis.  Hypertension.  PORTABLE CHEST - 1 VIEW  Comparison: 06/11/2011  Findings: Low lung volumes are seen however both lungs remain clear.  Patient is partially rotated to the right.  Heart size is stable.  Old bilateral rib fracture deformities are seen. Calcified granulomas again seen in the left lung base.  IMPRESSION: No acute findings.   Original Report Authenticated By: Myles Rosenthal, M.D.      No diagnosis found. 1. Altered mental status 2. Hypertension    MDM  The patient has increasing frequency of longstanding problem of syncopal episodes, with 5 today, one in the ED. He remains confused here asking the same questions repeatedly. He also remains hypertensive and tachycardic. Discussed observation period with [facility].        Arnoldo Hooker, PA-C 07/28/12 [phone]

## 2012-07-28 NOTE — H&P (Signed)
PCP:  Geraldo Pitter, MD  [facility]  Chief Complaint:  syncope  HPI: Noah Rose is a 62 y.o. male   has a past medical history of Hypertension.   Presented with  Decade long hx of recurrent syncopal-like spells. Patient had had this extensively worked up in the past [facility]. He is pulled by neurology stay at this point it is thought that this most likely secondary to an anxiety versus sleep apnea. Patient is not tolerating his CPAP and well. He reports history point back to his childhood of occasionally feeling lightheaded. In his middle-age she started to have syncopal episodes and then she had to be discharged from military secondary to frequent episodes. He had had extensive workup for seizures have had MRI,  have had cardiac workup And wore a Holter monitor. So far all his workup has been unremarkable. Today he had a total of 5 syncopal episodes There is somewhat above his baseline. His wife describes him as episodes of breathing rapidly looking anxious then rolling his eyes in the back of his head his left arm draws up to his body and he falls to the floor. For the past 2 years he had been calm confused after those episodes. He rarely  has incontinence associated with this. He is usually able to catch himself but not always. Patient states that he doesn't remember the episodes although  sometimes his memory somewhat fuzzy. Episode that he had while in the waiting home followed by about an hour long of confusion and altered mental status which per wife has been progressively getting more frequently. Of note he was also noted to be somewhat hypertensive in ER his wife states that he has not been able to tolerate a lot of his medications because they make him feel uncomfortable.  Wife states he is more lethargic due to taking Klonopin recently.   Patient treatments his episodes to his sense of nasal congestion it makes him feel like he is gasping for air. This has been worked up in the  past and his solution so far was to attempt a CPAP trial which she has trouble due to anxiety.  Review of Systems:     Pertinent positives include:nasal congestion, syncope  Constitutional:  No weight loss, night sweats, Fevers, chills, fatigue, weight loss  HEENT:  No headaches, Difficulty swallowing,Tooth/dental problems,Sore throat,  No sneezing, itching, ear ache, , post nasal drip,  Cardio-vascular:  No chest pain, Orthopnea, PND, anasarca, dizziness, palpitations.no Bilateral lower extremity swelling  GI:  No heartburn, indigestion, abdominal pain, nausea, vomiting, diarrhea, change in bowel habits, loss of appetite, melena, blood in stool, hematemesis Resp:  no shortness of breath at rest. No dyspnea on exertion, No excess mucus, no productive cough, No non-productive cough, No coughing up of blood.No change in color of mucus.No wheezing. Skin:  no rash or lesions. No jaundice GU:  no dysuria, change in color of urine, no urgency or frequency. No straining to urinate.  No flank pain.  Musculoskeletal:  No joint pain or no joint swelling. No decreased range of motion. No back pain.  Psych:  No change in mood or affect. No depression or anxiety. No memory loss.  Neuro: no localizing neurological complaints, no tingling, no weakness, no double vision, no gait abnormality, no slurred speech, no confusion  Otherwise ROS are negative except for above, 10 systems were reviewed  Past Medical History: Past Medical History  Diagnosis Date  . Hypertension    History reviewed. No pertinent  past surgical history.   Medications: Prior to Admission medications   Medication Sig Start Date End Date Taking? Authorizing Provider  clonazePAM (KLONOPIN) 0.5 MG tablet Take 0.5 mg by mouth 2 (two) times daily as needed. For anxiety.    Yes Historical Provider, MD  lisinopril (PRINIVIL,ZESTRIL) 20 MG tablet Take 20 mg by mouth 2 (two) times daily.     Yes Historical Provider, MD  potassium  chloride SA (K-DUR,KLOR-CON) 20 MEQ tablet Take 20 mEq by mouth daily.     Yes Historical Provider, MD  sodium chloride (OCEAN) 0.65 % nasal spray Place 1 spray into the nose as needed. For congestion   Yes Historical Provider, MD    Allergies:   Allergies  Allergen Reactions  . Amlodipine   . Amphetamines   . Aspirin   . Dextroamphetamine   . Felodipine   . Indapamide     Social History:  Ambulatory independently  Lives at   Home with wife   reports that he has never smoked. He does not have any smokeless tobacco history on file. He reports that he does not drink alcohol or use illicit drugs.   Family History: family history includes Dementia in his mother; Diabetes in his sister; Heart disease in his father; Leukemia in his sister; Pancreatic cancer in his sister; and Parkinson's disease in his sister.    Physical Exam: Patient Vitals for the past 24 hrs:  BP Temp Temp src Pulse Resp SpO2  07/27/12 2227 - 99.9 F (37.7 C) Oral - - -  07/27/12 2216 174/114 mmHg - - 107  - 94 %  07/27/12 2204 192/126 mmHg 98.3 F (36.8 C) Oral 116  16  95 %    1. General:  in No Acute distress 2. Psychological: lethargic but  Oriented 3. Head/ENT:   Moist Mucous Membranes                          Head Non traumatic, neck supple                          Normal   Dentition 4. SKIN: normal Skin turgor,  Skin clean Dry and intact no rash 5. Heart: Regular rate and rhythm no Murmur, Rub or gallop 6. Lungs: Clear to auscultation bilaterally, no wheezes or crackles   7. Abdomen: Soft, non-tender, Non distended 8. Lower extremities: no clubbing, cyanosis, or edema 9. Neurologically cranial nerves II through XII intact strength 5 out of 5 in all 4 extremities  10. MSK: Normal range of motion  body mass index is unknown because there is no height or weight on file.   Labs on Admission:   [facility] 07/27/12 2215  NA 137  K 3.6  CL 101  CO2 26  GLUCOSE 121*  BUN 9  CREATININE 1.11   CALCIUM 9.1  MG --  PHOS --    Basename 07/27/12 2215  AST 29  ALT 26  ALKPHOS 84  BILITOT 0.4  PROT 8.0  ALBUMIN 4.2   No results found for this basename: LIPASE:2,AMYLASE:2 in the last 72 hours  Basename 07/27/12 2215  WBC 12.9*  NEUTROABS 10.5*  HGB 16.5  HCT 47.6  MCV 80.8  PLT 209    Basename 07/27/12 2215  CKTOTAL --  CKMB --  CKMBINDEX --  TROPONINI <0.30   No results found for this basename: TSH,T4TOTAL,FREET3,T3FREE,THYROIDAB in the last 72 hours No results found for  this basename: VITAMINB12:2,FOLATE:2,FERRITIN:2,TIBC:2,IRON:2,RETICCTPCT:2 in the last 72 hours No results found for this basename: HGBA1C    The CrCl is unknown because both a height and weight (above a minimum accepted value) are required for this calculation. ABG    Component Value Date/Time   TCO2 25 11/01/2008 1703     No results found for this basename: DDIMER     Other results:  I have pearsonaly reviewed this: ECG REPORT  Rate: 114  Rhythm: sinus tachycardia ST&T Change: no ischemic changes     Cultures:    Component Value Date/Time   SDES URINE, RANDOM 03/21/2010 2050   SPECREQUEST Hypertension 03/21/2010 2050   CULT NO GROWTH 03/21/2010 2050   REPTSTATUS 03/23/2010 FINAL 03/21/2010 2050       Radiological Exams on Admission: Ct Head Wo Contrast  07/28/2012  *RADIOLOGY REPORT*  Clinical Data: Syncope.  Hypertension.  CT HEAD WITHOUT CONTRAST  Technique:  Contiguous axial images were obtained from the base of the skull through the vertex without contrast.  Comparison: 03/21/2010  Findings: The ventricles and sulci are symmetrical without significant effacement, displacement, or dilatation. No mass effect or midline shift. No abnormal extra-axial fluid collections. The grey-white matter junction is distinct. Basal cisterns are not effaced. No acute intracranial hemorrhage. No depressed skull fractures.  Air fluid levels in both maxillary antra with diffuse mucosal thickening  of the paranasal sinuses.  Mastoid air cells are are not opacified.  Incidental note of prominent aeration of the temporal bones bilaterally consistent with normal variation.  IMPRESSION: No evidence of acute intracranial abnormality.  Air-fluid levels in the maxillary antra suggest acute sinusitis.   Original Report Authenticated By: Burman Nieves, M.D.    Dg Chest Port 1 View  07/27/2012  *RADIOLOGY REPORT*  Clinical Data: Syncope.  Diaphoresis.  Hypertension.  PORTABLE CHEST - 1 VIEW  Comparison: 06/11/2011  Findings: Low lung volumes are seen however both lungs remain clear.  Patient is partially rotated to the right.  Heart size is stable.  Old bilateral rib fracture deformities are seen. Calcified granulomas again seen in the left lung base.  IMPRESSION: No acute findings.   Original Report Authenticated By: Myles Rosenthal, M.D.     Chart has been reviewed  Assessment/Plan  62 yo with prolonged hx of recurrent syncopal episodes here for evaluation of this due to prolonged confusion  Present on Admission:  . Syncope - this has been extensively worked up in the past, will observe and cycle CE, given tachycardia will obtain 2 d echo and check TSH, will need to obtain records from [facility].  Marland Kitchen Hypertension - continue home medication and add metoprolol as this can help with anxiety.    Prophylaxis: SCD   CODE STATUS: FULL CODE  Other plan as per orders.  I have spent a total of 55 min on this admission  Lavella Myren 07/28/2012, 1:40 AM

## 2012-07-28 NOTE — Progress Notes (Signed)
Utilization Review Completed.  07/28/2012  

## 2012-07-28 NOTE — Discharge Summary (Signed)
Physician Discharge Summary  Noah Rose OZH:[id number] DOB: 07-10-1950 DOA: 07/27/2012  PCP: Geraldo Pitter, MD  Admit date: 07/27/2012 Discharge date: 07/28/2012  Time spent: 45 minutes  Recommendations for Outpatient Follow-up:  1. Continue to followup at [facility] 2. Patient can be tried on different nasal steroids until he finds a preparation that he can tolerate  Discharge Diagnoses:  Multiple episodes of loss of consciousness-consistent with the known non-epileptogenic seizures  Hypertension Chronic rhinitis Obstructive sleep apnea Chronic anxiety   Discharge Condition: Back to baseline  Diet recommendation: Low salt heart healthy  Filed Weights   07/28/12 0237  Weight: 99.474 kg (219 lb 4.8 oz)    History of present illness:  62 year old gentleman with long history of non-epileptogenic seizures, followed by neurology and psychiatry at [facility], presented to [facility] room after he had 5 recurrent episodes of loss of consciousness during one day. Patient had an episode of loss of consciousness for one minute while in triage in the ED and he was referred for admission to the hospitalist service.   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 The episodes as described by his wife and the ED personnel are consistent with pseudoseizure. Patient is followed by neurology at [facility] and is not on antiepileptics. According to wife the patient used to be on antiepileptics which caused more side effects than benefits and did not reduce the number of pseudo seizures. I have reassured the patient and his wife about the non-life-threatening nature of his condition, and recommended that they followup as previously scheduled with psychiatry at [facility].  2. hypertension-recommended patient continue his medication and try to use CPAP as much as possible for sleep apnea. Suspect pressure was difficult to control in  the hospital due to anxiety.  3. chronic allergic rhinitis. Patient complained that Flonase was causing him to have seizures. I have switched him to Rhinocort 4. patient was tachycardic in the emergency room. TSH was obtained and it was normal. We suspect tachycardia was due to anxiety.   Procedures: None   Consultations:  None     Discharge Exam: Filed Vitals:   07/28/12 0237 07/28/12 0500 07/28/12 0938 07/28/12 0940  BP: 194/122 156/90 185/110   Pulse:    81  Temp: 98.2 F (36.8 C) 98.7 F (37.1 C)    TempSrc:      Resp: 24 20    Height: 5\' 9"  (1.753 m)     Weight: 99.474 kg (219 lb 4.8 oz)     SpO2: 96% 98%      General: axox3 Cardiovascular: rrr Respiratory: ctab   Discharge Instructions      Discharge Orders    Future Orders Please Complete By Expires   Diet - low sodium heart healthy      Increase activity slowly          Medication List     As of 07/28/2012  6:49 PM    TAKE these medications         budesonide 32 MCG/ACT nasal spray   Commonly known as: RHINOCORT AQUA   Place 1 spray into the nose daily.      clonazePAM 0.5 MG tablet   Commonly known as: KLONOPIN   Take 0.5 mg by mouth 2 (two) times daily as needed. For anxiety.      lisinopril 20 MG tablet   Commonly known as: PRINIVIL,ZESTRIL   Take 20 mg by mouth 2 (two)  times daily.      potassium chloride SA 20 MEQ tablet   Commonly known as: K-DUR,KLOR-CON   Take 20 mEq by mouth daily.      sodium chloride 0.65 % nasal spray   Commonly known as: OCEAN   Place 1 spray into the nose as needed. For congestion        Follow-up Information    Schedule an appointment as soon as possible for a visit with Geraldo Pitter, MD.   Contact information:   [address] [place] [address] [phone]       Please follow up. (keep up all your appointments at [facility])           The results of significant diagnostics from this hospitalization (including imaging, microbiology,  ancillary and laboratory) are listed below for reference.    Significant Diagnostic Studies: Ct Head Wo Contrast  07/28/2012  *RADIOLOGY REPORT*  Clinical Data: Syncope.  Hypertension.  CT HEAD WITHOUT CONTRAST  Technique:  Contiguous axial images were obtained from the base of the skull through the vertex without contrast.  Comparison: 03/21/2010  Findings: The ventricles and sulci are symmetrical without significant effacement, displacement, or dilatation. No mass effect or midline shift. No abnormal extra-axial fluid collections. The grey-white matter junction is distinct. Basal cisterns are not effaced. No acute intracranial hemorrhage. No depressed skull fractures.  Air fluid levels in both maxillary antra with diffuse mucosal thickening of the paranasal sinuses.  Mastoid air cells are are not opacified.  Incidental note of prominent aeration of the temporal bones bilaterally consistent with normal variation.  IMPRESSION: No evidence of acute intracranial abnormality.  Air-fluid levels in the maxillary antra suggest acute sinusitis.   Original Report Authenticated By: Burman Nieves, M.D.    Dg Chest Port 1 View  07/27/2012  *RADIOLOGY REPORT*  Clinical Data: Syncope.  Diaphoresis.  Hypertension.  PORTABLE CHEST - 1 VIEW  Comparison: 06/11/2011  Findings: Low lung volumes are seen however both lungs remain clear.  Patient is partially rotated to the right.  Heart size is stable.  Old bilateral rib fracture deformities are seen. Calcified granulomas again seen in the left lung base.  IMPRESSION: No acute findings.   Original Report Authenticated By: Myles Rosenthal, M.D.     Microbiology: No results found for this or any previous visit (from the past 240 hour(s)).   Labs: Basic Metabolic Panel:  Lab 07/28/12 4401 07/27/12 2215  NA 139 137  K 3.6 3.6  CL 105 101  CO2 27 26  GLUCOSE 110* 121*  BUN 9 9  CREATININE 1.11 1.11  CALCIUM 8.6 9.1  MG 2.3 --  PHOS 3.8 --   Liver Function  Tests:  Lab 07/28/12 0500 07/27/12 2215  AST 23 29  ALT 22 26  ALKPHOS 70 84  BILITOT 0.4 0.4  PROT 6.6 8.0  ALBUMIN 3.5 4.2   No results found for this basename: LIPASE:5,AMYLASE:5 in the last 168 hours No results found for this basename: AMMONIA:5 in the last 168 hours CBC:  Lab 07/28/12 0500 07/27/12 2215  WBC 10.9* 12.9*  NEUTROABS -- 10.5*  HGB 14.7 16.5  HCT 43.2 47.6  MCV 81.4 80.8  PLT 194 209   Cardiac Enzymes:  Lab 07/28/12 0500 07/27/12 2215  CKTOTAL -- --  CKMB -- --  CKMBINDEX -- --  TROPONINI <0.30 <0.30   BNP: BNP (last 3 results) No results found for this basename: PROBNP:3 in the last 8760 hours CBG:  Lab  07/28/12 1150 07/28/12 0737  GLUCAP 107* 103*       Signed:  Miquel Stacks  [facility] 07/28/2012, 6:49 PM

## 2012-07-28 NOTE — ED Notes (Signed)
Dr. Adela Glimpse finished at [facility], Alert, NAD, calm, interactive, denies questions, wife at [facility], updated. VSS, up on monitor with RN.

## 2012-07-30 NOTE — ED Provider Notes (Signed)
Medical screening examination/treatment/procedure(s) were performed by non-physician practitioner and as supervising physician I was immediately available for consultation/collaboration.   Rhodie Cienfuegos L Bueford Arp, MD 07/30/12 1444 

## 2012-11-05 DIAGNOSIS — G40209 Localization-related (focal) (partial) symptomatic epilepsy and epileptic syndromes with complex partial seizures, not intractable, without status epilepticus: Secondary | ICD-10-CM | POA: Insufficient documentation

## 2013-04-02 DIAGNOSIS — R351 Nocturia: Secondary | ICD-10-CM | POA: Insufficient documentation

## 2013-04-02 DIAGNOSIS — R350 Frequency of micturition: Secondary | ICD-10-CM | POA: Insufficient documentation

## 2013-04-15 DIAGNOSIS — R0981 Nasal congestion: Secondary | ICD-10-CM | POA: Insufficient documentation

## 2013-05-08 DIAGNOSIS — H2510 Age-related nuclear cataract, unspecified eye: Secondary | ICD-10-CM | POA: Insufficient documentation

## 2013-05-22 ENCOUNTER — Encounter (HOSPITAL_COMMUNITY): Payer: Self-pay | Admitting: Licensed Clinical Social Worker

## 2013-05-22 ENCOUNTER — Ambulatory Visit (INDEPENDENT_AMBULATORY_CARE_PROVIDER_SITE_OTHER): Payer: Medicare Other | Admitting: Licensed Clinical Social Worker

## 2013-05-22 DIAGNOSIS — F4323 Adjustment disorder with mixed anxiety and depressed mood: Secondary | ICD-10-CM

## 2013-05-22 DIAGNOSIS — F4320 Adjustment disorder, unspecified: Secondary | ICD-10-CM

## 2013-05-22 NOTE — Progress Notes (Signed)
Patient ID: Noah Rose, male   DOB: 07/12/1950, 62 y.o.   MRN: [id number] Patient:   Noah Rose   DOB:   [birth year]  MR Number:  [id number]  Location:  [facility] [place] [id number] [place] [address] Dept: [phone]           Date of Service:   05/22/2013  Start Time:   1:00pm End Time:   1:50pm  Provider/Observer:  Geanie Berlin LCSW       Billing Code/Service: [phone]  Chief Complaint:     Chief Complaint  Patient presents with  . Anxiety    Reason for Service:  Referred by [facility]  Current Status:  Patient comes to this appointment following inpatient treatment at [facility] for HTN and dizziness. He reports that he does not know why [facility] referred him for treatment here. He denies any anxiety, depression, mania, OCD, AH, VH and paranoia. He reports recently learning that he is allergic to many foods and has struggles with the results of allergies his entire life. He feels that on occasion he cannot breath due to nasal congestion resulting in dizziness. He denies any history of or current suicidal or homicidal ideation, intent or plan. He reports that he is happy with his life and has a strong faith. He has sleep apnea. His appetite is wnl  Reliability of Information: Fair. Patient is unable to provide a clear reason for this referral. He reports no psychological problems at this time.   Behavioral Observation: KORIN HARTWELL  presents as a 62 y.o.-year-old  African American Male who appeared his stated age. his dress was Appropriate and he was Well Groomed and his manners were Appropriate to the situation.  There were not any physical disabilities noted.  he displayed an appropriate level of cooperation and motivation.    Interactions:    Active   Attention:   within normal limits  Memory:   within normal limits  Visuo-spatial:   within normal limits  Speech  (Volume):  normal  Speech:   normal pitch and normal volume  Thought Process:  Coherent and Relevant  Though Content:  WNL  Orientation:   person, place and time/date  Judgment:   Good  Planning:   Good  Affect:    Appropriate  Mood:    Euthymic  Insight:   Good  Intelligence:   high  Marital Status/Living: Married for 39 years, with two daughters. Lives with wife.   Current Employment: retired  Past Employment:  [employer]  Substance Use:  No concerns of substance abuse are reported.    Education:   College  Medical History:   Past Medical History  Diagnosis Date  . Hypertension   . Anxiety   . High blood pressure   . Multiple allergies   . Dizziness         Outpatient Encounter Prescriptions as of 05/22/2013  Medication Sig  . budesonide (RHINOCORT AQUA) 32 MCG/ACT nasal spray Place 1 spray into the nose daily.  . clonazePAM (KLONOPIN) 0.5 MG tablet Take 0.5 mg by mouth 2 (two) times daily as needed. For anxiety.   Marland Kitchen lisinopril (PRINIVIL,ZESTRIL) 20 MG tablet Take 20 mg by mouth 2 (two) times daily.    . potassium chloride SA (K-DUR,KLOR-CON) 20 MEQ tablet Take 20 mEq by mouth daily.    . sodium chloride (OCEAN) 0.65 % nasal spray Place 1 spray into the nose as needed. For congestion  Sexual History:   History  Sexual Activity  . 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  Family Med/Psych History:  Family History  Problem Relation Age of Onset  . Dementia Mother   . Heart disease Father   . Parkinson's disease Sister   . Diabetes Sister   . Pancreatic cancer Sister   . Leukemia Sister     Risk of Suicide/Violence: virtually non-existent   Impression/DX:  Adjustment disorder   Disposition/Plan:   patient is not in need of theraputic services at this time based on his history. He agrees and no further appointment has been scheduled.    Diagnosis:    Axis I:  Adjustment disorder     Axis II: No diagnosis       Axis III:  HTN      Axis IV:  None           Axis V:  61-70 mild symptoms

## 2013-09-25 DIAGNOSIS — J343 Hypertrophy of nasal turbinates: Secondary | ICD-10-CM | POA: Insufficient documentation

## 2013-09-25 DIAGNOSIS — J310 Chronic rhinitis: Secondary | ICD-10-CM | POA: Insufficient documentation

## 2013-10-24 ENCOUNTER — Emergency Department (HOSPITAL_COMMUNITY): Payer: Medicare Other

## 2013-10-24 ENCOUNTER — Encounter (HOSPITAL_COMMUNITY): Payer: Self-pay | Admitting: Emergency Medicine

## 2013-10-24 ENCOUNTER — Emergency Department (HOSPITAL_COMMUNITY)
Admission: EM | Admit: 2013-10-24 | Discharge: 2013-10-24 | Disposition: A | Discharge status: Home / Self Care | Payer: Medicare Other | Attending: Emergency Medicine | Admitting: Emergency Medicine

## 2013-10-24 DIAGNOSIS — R569 Unspecified convulsions: Secondary | ICD-10-CM

## 2013-10-24 DIAGNOSIS — I10 Essential (primary) hypertension: Secondary | ICD-10-CM | POA: Insufficient documentation

## 2013-10-24 DIAGNOSIS — Z79899 Other long term (current) drug therapy: Secondary | ICD-10-CM | POA: Insufficient documentation

## 2013-10-24 DIAGNOSIS — G40909 Epilepsy, unspecified, not intractable, without status epilepticus: Secondary | ICD-10-CM | POA: Insufficient documentation

## 2013-10-24 DIAGNOSIS — R4182 Altered mental status, unspecified: Secondary | ICD-10-CM | POA: Insufficient documentation

## 2013-10-24 DIAGNOSIS — F411 Generalized anxiety disorder: Secondary | ICD-10-CM | POA: Insufficient documentation

## 2013-10-24 DIAGNOSIS — IMO0002 Reserved for concepts with insufficient information to code with codable children: Secondary | ICD-10-CM | POA: Insufficient documentation

## 2013-10-24 HISTORY — DX: Unspecified convulsions: R56.9

## 2013-10-24 LAB — URINALYSIS, ROUTINE W REFLEX MICROSCOPIC
Bilirubin Urine: NEGATIVE
GLUCOSE, UA: NEGATIVE mg/dL
Hgb urine dipstick: NEGATIVE
Ketones, ur: NEGATIVE mg/dL
LEUKOCYTES UA: NEGATIVE
Nitrite: NEGATIVE
PH: 6.5 (ref 5.0–8.0)
PROTEIN: NEGATIVE mg/dL
Specific Gravity, Urine: 1.011 (ref 1.005–1.030)
Urobilinogen, UA: 0.2 mg/dL (ref 0.0–1.0)

## 2013-10-24 LAB — CBC WITH DIFFERENTIAL/PLATELET
BASOS PCT: 0 % (ref 0–1)
Basophils Absolute: 0 10*3/uL (ref 0.0–0.1)
EOS ABS: 0.1 10*3/uL (ref 0.0–0.7)
Eosinophils Relative: 1 % (ref 0–5)
HEMATOCRIT: 47.3 % (ref 39.0–52.0)
HEMOGLOBIN: 16.3 g/dL (ref 13.0–17.0)
Lymphocytes Relative: 17 % (ref 12–46)
Lymphs Abs: 1.6 10*3/uL (ref 0.7–4.0)
MCH: 27.6 pg (ref 26.0–34.0)
MCHC: 34.5 g/dL (ref 30.0–36.0)
MCV: 80 fL (ref 78.0–100.0)
MONO ABS: 1 10*3/uL (ref 0.1–1.0)
MONOS PCT: 11 % (ref 3–12)
Neutro Abs: 6.9 10*3/uL (ref 1.7–7.7)
Neutrophils Relative %: 71 % (ref 43–77)
Platelets: 210 10*3/uL (ref 150–400)
RBC: 5.91 MIL/uL — ABNORMAL HIGH (ref 4.22–5.81)
RDW: 14.2 % (ref 11.5–15.5)
WBC: 9.7 10*3/uL (ref 4.0–10.5)

## 2013-10-24 LAB — RAPID URINE DRUG SCREEN, HOSP PERFORMED
Amphetamines: NOT DETECTED
BENZODIAZEPINES: POSITIVE — AB
Barbiturates: NOT DETECTED
COCAINE: NOT DETECTED
Opiates: NOT DETECTED
Tetrahydrocannabinol: NOT DETECTED

## 2013-10-24 LAB — COMPREHENSIVE METABOLIC PANEL
ALBUMIN: 4.3 g/dL (ref 3.5–5.2)
ALT: 24 U/L (ref 0–53)
AST: 31 U/L (ref 0–37)
Alkaline Phosphatase: 67 U/L (ref 39–117)
BUN: 18 mg/dL (ref 6–23)
CO2: 25 mEq/L (ref 19–32)
CREATININE: 0.97 mg/dL (ref 0.50–1.35)
Calcium: 8.9 mg/dL (ref 8.4–10.5)
Chloride: 97 mEq/L (ref 96–112)
GFR calc Af Amer: >90 mL/min (ref >90)
GFR calc non Af Amer: 85 mL/min — ABNORMAL LOW (ref >90)
Glucose, Bld: 106 mg/dL — ABNORMAL HIGH (ref 70–99)
Potassium: 3.8 mEq/L (ref 3.7–5.3)
Sodium: 136 mEq/L — ABNORMAL LOW (ref 137–147)
TOTAL PROTEIN: 7.8 g/dL (ref 6.0–8.3)
Total Bilirubin: 0.4 mg/dL (ref 0.3–1.2)

## 2013-10-24 LAB — MAGNESIUM: MAGNESIUM: 2.3 mg/dL (ref 1.5–2.5)

## 2013-10-24 LAB — I-STAT TROPONIN, ED: TROPONIN I, POC: 0 ng/mL (ref 0.00–0.08)

## 2013-10-24 LAB — ETHANOL

## 2013-10-24 MED ORDER — CLONIDINE HCL 0.1 MG PO TABS
0.1000 mg | ORAL_TABLET | Freq: Once | ORAL | Status: AC
  Administered 2013-10-24: 0.1 mg via ORAL
  Filled 2013-10-24: qty 1

## 2013-10-24 MED ORDER — CLONIDINE HCL 0.1 MG PO TABS: 0.1000 mg | ORAL_TABLET | Freq: Once | ORAL | Status: DC

## 2013-10-24 NOTE — ED Notes (Signed)
Triage RN reported that Pt's heart rate increased to 133 after initial EKG was completed.  Pt's wife reports has not been taking medication, since Sunday.  Reports Hx of "partial" seizures and he had "seizure-like" in Triage when HR increased.  Reports he "contracts right arm, during seizures."

## 2013-10-24 NOTE — ED Notes (Addendum)
Per pt/family, states issues with BP since Sunday-PCP has been trying to regulate it-states on Allegra for allergies and thinks that is causing the elevation, states allegra with BP meds makes him feel bad-family member states she thinks he was having seizures yesterday

## 2013-10-24 NOTE — ED Provider Notes (Signed)
CSN: [id number]     Arrival date & time 10/24/13  1008 History   First MD Initiated Contact with Patient 10/24/13 1022     Chief Complaint  Patient presents with  . Hypertension     (Consider location/radiation/quality/duration/timing/severity/associated sxs/prior Treatment) HPI Comments: Patient presents with elevated blood pressure and seizure activity. 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 His wife states he has seizures where he draws his right arm out and becomes less responsive for a few minutes. He typically confused for 20-30 minutes after that. He takes clobazam and Clonopin for his seizures. He is followed by Dr. Lysle Rubens with neurology at [facility]. His wife states that he's had increase in his seizure since yesterday. Since yesterday, he had about 7 and the seizure episodes. She also states he stopped taking his blood pressure medicine and Sunday because it was making him feel bad and he hasn't had any of his antihypertensive medications since Sunday. There's been no history of fevers, falls or recent illnesses. There's been no vomiting. He was admitted to [facility] in October for the elevated blood pressure and seizures. Per the wife, and they did an EEG which showed possible partial seizure activity but it was inconclusive.  Patient is a 63 y.o. male presenting with hypertension.  Hypertension    Past Medical History  Diagnosis Date  . Hypertension   . Anxiety   . High blood pressure   . Multiple allergies   . Dizziness   . Seizures    History reviewed. No pertinent past surgical history. Family History  Problem Relation Age of Onset  . Dementia Mother   . Heart disease Father   . Parkinson's disease Sister   . Diabetes Sister   . Pancreatic cancer Sister   . Leukemia Sister    History  Substance Use Topics  . Smoking status: Never Smoker   . Smokeless tobacco: Not on file  . Alcohol Use: No    Review of Systems  Unable  to perform ROS: Mental status change      Allergies  Amlodipine; Amphetamines; Aspirin; Dextroamphetamine; Felodipine; and Indapamide  Home Medications   Prior to Admission medications   Medication Sig Start Date End Date Taking? Authorizing Provider  budesonide (RHINOCORT AQUA) 32 MCG/ACT nasal spray Place 1 spray into the nose daily. 07/28/12   Sorin June Leap, MD  clonazePAM (KLONOPIN) 0.5 MG tablet Take 0.5 mg by mouth 2 (two) times daily as needed. For anxiety.     Historical Provider, MD  lisinopril (PRINIVIL,ZESTRIL) 20 MG tablet Take 20 mg by mouth 2 (two) times daily.      Historical Provider, MD  potassium chloride SA (K-DUR,KLOR-CON) 20 MEQ tablet Take 20 mEq by mouth daily.      Historical Provider, MD  sodium chloride (OCEAN) 0.65 % nasal spray Place 1 spray into the nose as needed. For congestion    Historical Provider, MD   BP 171/112  Pulse 86  Temp(Src) 98.8 F (37.1 C) (Oral)  Resp 18  SpO2 98% Physical Exam  Constitutional: He is oriented to person, place, and time. He appears well-developed and well-nourished.  HENT:  Head: Normocephalic and atraumatic.  Eyes: Pupils are equal, round, and reactive to light.  Neck: Normal range of motion. Neck supple.  Cardiovascular: Normal rate, regular rhythm and normal heart sounds.   Pulmonary/Chest: Effort normal and breath sounds normal. No respiratory distress. He has no wheezes. He has no rales. He exhibits no tenderness.  Abdominal: Soft. Bowel sounds are normal. There is no tenderness. There is no rebound and no guarding.  Musculoskeletal: Normal range of motion. He exhibits no edema.  Lymphadenopathy:    He has no cervical adenopathy.  Neurological: He is alert and oriented to person, place, and time. He has normal strength. No cranial nerve deficit or sensory deficit. GCS eye subscore is 4. GCS verbal subscore is 5. GCS motor subscore is 6.  FTN intact  Skin: Skin is warm and dry. No rash noted.  Psychiatric: He has a  normal mood and affect.    ED Course  Procedures (including critical care time) Labs Review Results for orders placed during the hospital encounter of 10/24/13  CBC WITH DIFFERENTIAL      Result Value Ref Range   WBC 9.7  4.0 - 10.5 K/uL   RBC 5.91 (*) 4.22 - 5.81 MIL/uL   Hemoglobin 16.3  13.0 - 17.0 g/dL   HCT 47.3  39.0 - 52.0 %   MCV 80.0  78.0 - 100.0 fL   MCH 27.6  26.0 - 34.0 pg   MCHC 34.5  30.0 - 36.0 g/dL   RDW 14.2  11.5 - 15.5 %   Platelets 210  150 - 400 K/uL   Neutrophils Relative % 71  43 - 77 %   Neutro Abs 6.9  1.7 - 7.7 K/uL   Lymphocytes Relative 17  12 - 46 %   Lymphs Abs 1.6  0.7 - 4.0 K/uL   Monocytes Relative 11  3 - 12 %   Monocytes Absolute 1.0  0.1 - 1.0 K/uL   Eosinophils Relative 1  0 - 5 %   Eosinophils Absolute 0.1  0.0 - 0.7 K/uL   Basophils Relative 0  0 - 1 %   Basophils Absolute 0.0  0.0 - 0.1 K/uL  COMPREHENSIVE METABOLIC PANEL      Result Value Ref Range   Sodium 136 (*) 137 - 147 mEq/L   Potassium 3.8  3.7 - 5.3 mEq/L   Chloride 97  96 - 112 mEq/L   CO2 25  19 - 32 mEq/L   Glucose, Bld 106 (*) 70 - 99 mg/dL   BUN 18  6 - 23 mg/dL   Creatinine, Ser 0.97  0.50 - 1.35 mg/dL   Calcium 8.9  8.4 - 10.5 mg/dL   Total Protein 7.8  6.0 - 8.3 g/dL   Albumin 4.3  3.5 - 5.2 g/dL   AST 31  0 - 37 U/L   ALT 24  0 - 53 U/L   Alkaline Phosphatase 67  39 - 117 U/L   Total Bilirubin 0.4  0.3 - 1.2 mg/dL   GFR calc non Af Amer 85 (*) >90 mL/min   GFR calc Af Amer >90  >90 mL/min  URINALYSIS, ROUTINE W REFLEX MICROSCOPIC      Result Value Ref Range   Color, Urine YELLOW  YELLOW   APPearance CLEAR  CLEAR   Specific Gravity, Urine 1.011  1.005 - 1.030   pH 6.5  5.0 - 8.0   Glucose, UA NEGATIVE  NEGATIVE mg/dL   Hgb urine dipstick NEGATIVE  NEGATIVE   Bilirubin Urine NEGATIVE  NEGATIVE   Ketones, ur NEGATIVE  NEGATIVE mg/dL   Protein, ur NEGATIVE  NEGATIVE mg/dL   Urobilinogen, UA 0.2  0.0 - 1.0 mg/dL   Nitrite NEGATIVE  NEGATIVE   Leukocytes,  UA NEGATIVE  NEGATIVE  ETHANOL      Result Value Ref  Range   Alcohol, Ethyl (B) <11  0 - 11 mg/dL  URINE RAPID DRUG SCREEN (HOSP PERFORMED)      Result Value Ref Range   Opiates NONE DETECTED  NONE DETECTED   Cocaine NONE DETECTED  NONE DETECTED   Benzodiazepines POSITIVE (*) NONE DETECTED   Amphetamines NONE DETECTED  NONE DETECTED   Tetrahydrocannabinol NONE DETECTED  NONE DETECTED   Barbiturates NONE DETECTED  NONE DETECTED  MAGNESIUM      Result Value Ref Range   Magnesium 2.3  1.5 - 2.5 mg/dL  I-STAT TROPOININ, ED      Result Value Ref Range   Troponin i, poc 0.00  0.00 - 0.08 ng/mL   Comment 3            Ct Head Wo Contrast  10/24/2013   CLINICAL DATA:  Dizziness with history of seizures. Elevated blood pressure.  EXAM: CT HEAD WITHOUT CONTRAST  TECHNIQUE: Contiguous axial images were obtained from the base of the skull through the vertex without contrast.  COMPARISON:  07/28/2012 CT.  11/10/2006 MR.  FINDINGS: Normal appearance of the intracranial structures. No evidence for acute hemorrhage, mass lesion, midline shift, hydrocephalus or large infarct. No acute bony abnormality. The visualized sinuses are clear.  IMPRESSION: No acute intracranial abnormality.  Stable appearance from priors.   Electronically Signed   By: Rolla Flatten M.D.   On: 10/24/2013 11:23      Imaging Review Ct Head Wo Contrast  10/24/2013   CLINICAL DATA:  Dizziness with history of seizures. Elevated blood pressure.  EXAM: CT HEAD WITHOUT CONTRAST  TECHNIQUE: Contiguous axial images were obtained from the base of the skull through the vertex without contrast.  COMPARISON:  07/28/2012 CT.  11/10/2006 MR.  FINDINGS: Normal appearance of the intracranial structures. No evidence for acute hemorrhage, mass lesion, midline shift, hydrocephalus or large infarct. No acute bony abnormality. The visualized sinuses are clear.  IMPRESSION: No acute intracranial abnormality.  Stable appearance from priors.   Electronically  Signed   By: Rolla Flatten M.D.   On: 10/24/2013 11:23     EKG Interpretation   Date/Time:  Thursday October 24 2013 10:27:00 EDT Ventricular Rate:  87 PR Interval:  160 QRS Duration: 93 QT Interval:  354 QTC Calculation: 426 R Axis:   19 Text Interpretation:  Sinus rhythm Left atrial enlargement Probable left  ventricular hypertrophy Baseline wander in lead(s) V4 V5 since last  tracing no significant change Confirmed by Avyn Aden  MD, Cymone Yeske ([id number]) on  10/24/2013 3:17:58 PM      MDM   Final diagnoses:  Seizures  HTN (hypertension)    Patient presents with his seizure activity and elevated blood pressure. He hasn't taken his blood pressure medicine for her 4 days now. He was given dose of clonidine in ED in his blood pressure did come down although it's not normalize. I suspect this is due to noncompliance with his medications for the last few days. His seizure activity since typical with his baseline seizures. He's back to his normal mental status now. Had no further seizure activity in ED. I attempted to get a hold of his neurologist at [facility]. However they were paged 3 times with no return phone call. His wife also attempted to get a hold of the neurologist and was unsuccessful. Given that his seizures are not significantly different from his prior seizures and he had no ongoing seizure activity, actually he can be discharged home. His wife is good with this  plan. I encouraged him to take his blood pressure medicine regularly. His wife is going to give him his normal dose of hydralazine when he gets home today and his lisinopril today. I encouraged him to followup with his primary care physician for recheck of blood pressure and his neurologist for checkup on his seizures. He's currently asymptomatic and I feel comfortable sending him home.    Malvin Johns, MD 10/24/13 1524

## 2013-10-24 NOTE — Discharge Instructions (Signed)
Hypertension As your heart beats, it forces blood through your arteries. This force is your blood pressure. If the pressure is too high, it is called hypertension (HTN) or high blood pressure. HTN is dangerous because you may have it and not know it. High blood pressure may mean that your heart has to work harder to pump blood. Your arteries may be narrow or stiff. The extra work puts you at risk for heart disease, stroke, and other problems.  Blood pressure consists of two numbers, a higher number over a lower, 110/72, for example. It is stated as "110 over 72." The ideal is below 120 for the top number (systolic) and under 80 for the bottom (diastolic). Write down your blood pressure today. You should pay close attention to your blood pressure if you have certain conditions such as:  Heart failure.  Prior heart attack.  Diabetes  Chronic kidney disease.  Prior stroke.  Multiple risk factors for heart disease. To see if you have HTN, your blood pressure should be measured while you are seated with your arm held at the level of the heart. It should be measured at least twice. A one-time elevated blood pressure reading (especially in the Emergency Department) does not mean that you need treatment. There may be conditions in which the blood pressure is different between your right and left arms. It is important to see your caregiver soon for a recheck. Most people have essential hypertension which means that there is not a specific cause. This type of high blood pressure may be lowered by changing lifestyle factors such as:  Stress.  Smoking.  Lack of exercise.  Excessive weight.  Drug/tobacco/alcohol use.  Eating less salt. Most people do not have symptoms from high blood pressure until it has caused damage to the body. Effective treatment can often prevent, delay or reduce that damage. TREATMENT  When a cause has been identified, treatment for high blood pressure is directed at the  cause. There are a large number of medications to treat HTN. These fall into several categories, and your caregiver will help you select the medicines that are best for you. Medications may have side effects. You should review side effects with your caregiver. If your blood pressure stays high after you have made lifestyle changes or started on medicines,   Your medication(s) may need to be changed.  Other problems may need to be addressed.  Be certain you understand your prescriptions, and know how and when to take your medicine.  Be sure to follow up with your caregiver within the time frame advised (usually within two weeks) to have your blood pressure rechecked and to review your medications.  If you are taking more than one medicine to lower your blood pressure, make sure you know how and at what times they should be taken. Taking two medicines at the same time can result in blood pressure that is too low. SEEK IMMEDIATE MEDICAL CARE IF:  You develop a severe headache, blurred or changing vision, or confusion.  You have unusual weakness or numbness, or a faint feeling.  You have severe chest or abdominal pain, vomiting, or breathing problems. MAKE SURE YOU:   Understand these instructions.  Will watch your condition.  Will get help right away if you are not doing well or get worse. Document Released: 06/13/2005 Document Revised: 09/05/2011 Document Reviewed: 02/01/2008 [facility] Patient Information 2014 [place].  Seizure, Adult A seizure is abnormal electrical activity in the brain. Seizures usually last from  30 seconds to 2 minutes. There are various types of seizures. Before a seizure, you may have a warning sensation (aura) that a seizure is about to occur. An aura may include the following symptoms:   Fear or anxiety.  Nausea.  Feeling like the room is spinning (vertigo).  Vision changes, such as seeing flashing lights or spots. Common symptoms during a seizure  include:  A change in attention or behavior (altered mental status).  Convulsions with rhythmic jerking movements.  Drooling.  Rapid eye movements.  Grunting.  Loss of bladder and bowel control.  Bitter taste in the mouth.  Tongue biting. After a seizure, you may feel confused and sleepy. You may also have an injury resulting from convulsions during the seizure. HOME CARE INSTRUCTIONS   If you are given medicines, take them exactly as prescribed by your health care provider.  Keep all follow-up appointments as directed by your health care provider.  Do not swim or drive or engage in risky activity during which a seizure could cause further injury to you or others until your health care provider says it is OK.  Get adequate rest.  Teach friends and family what to do if you have a seizure. They should:  Lay you on the ground to prevent a fall.  Put a cushion under your head.  Loosen any tight clothing around your neck.  Turn you on your side. If vomiting occurs, this helps keep your airway clear.  Stay with you until you recover.  Know whether or not you need emergency care. SEEK IMMEDIATE MEDICAL CARE IF:  The seizure lasts longer than 5 minutes.  The seizure is severe or you do not wake up immediately after the seizure.  You have an altered mental status after the seizure.  You are having more frequent or worsening seizures. Someone should drive you to the emergency department or call local emergency services (911 in [place] MAKE SURE YOU:  Understand these instructions.  Will watch your condition.  Will get help right away if you are not doing well or get worse. Document Released: 06/10/2000 Document Revised: 04/03/2013 Document Reviewed: 01/23/2013 [facility] Patient Information 2014 [place].

## 2013-11-26 DIAGNOSIS — E162 Hypoglycemia, unspecified: Secondary | ICD-10-CM | POA: Insufficient documentation

## 2014-04-03 DIAGNOSIS — Z789 Other specified health status: Secondary | ICD-10-CM | POA: Insufficient documentation

## 2015-01-01 ENCOUNTER — Emergency Department (HOSPITAL_COMMUNITY)
Admission: EM | Admit: 2015-01-01 | Discharge: 2015-01-01 | Disposition: A | Discharge status: Home / Self Care | Payer: Medicare Other | Attending: Emergency Medicine | Admitting: Emergency Medicine

## 2015-01-01 ENCOUNTER — Encounter (HOSPITAL_COMMUNITY): Payer: Self-pay | Admitting: *Deleted

## 2015-01-01 DIAGNOSIS — I10 Essential (primary) hypertension: Secondary | ICD-10-CM | POA: Diagnosis not present

## 2015-01-01 DIAGNOSIS — F419 Anxiety disorder, unspecified: Secondary | ICD-10-CM | POA: Diagnosis not present

## 2015-01-01 DIAGNOSIS — R0981 Nasal congestion: Secondary | ICD-10-CM | POA: Insufficient documentation

## 2015-01-01 DIAGNOSIS — R569 Unspecified convulsions: Secondary | ICD-10-CM | POA: Diagnosis not present

## 2015-01-01 LAB — CBC WITH DIFFERENTIAL/PLATELET
BASOS ABS: 0 10*3/uL (ref 0.0–0.1)
BASOS PCT: 0 % (ref 0–1)
Eosinophils Absolute: 0.2 10*3/uL (ref 0.0–0.7)
Eosinophils Relative: 2 % (ref 0–5)
HEMATOCRIT: 43.5 % (ref 39.0–52.0)
Hemoglobin: 14.7 g/dL (ref 13.0–17.0)
LYMPHS PCT: 8 % — AB (ref 12–46)
Lymphs Abs: 0.8 10*3/uL (ref 0.7–4.0)
MCH: 27.2 pg (ref 26.0–34.0)
MCHC: 33.8 g/dL (ref 30.0–36.0)
MCV: 80.4 fL (ref 78.0–100.0)
Monocytes Absolute: 0.7 10*3/uL (ref 0.1–1.0)
Monocytes Relative: 7 % (ref 3–12)
NEUTROS ABS: 8.3 10*3/uL — AB (ref 1.7–7.7)
Neutrophils Relative %: 83 % — ABNORMAL HIGH (ref 43–77)
PLATELETS: 209 10*3/uL (ref 150–400)
RBC: 5.41 MIL/uL (ref 4.22–5.81)
RDW: 14.3 % (ref 11.5–15.5)
WBC: 10 10*3/uL (ref 4.0–10.5)

## 2015-01-01 LAB — COMPREHENSIVE METABOLIC PANEL
ALBUMIN: 4.3 g/dL (ref 3.5–5.0)
ALK PHOS: 64 U/L (ref 38–126)
ALT: 20 U/L (ref 17–63)
AST: 26 U/L (ref 15–41)
Anion gap: 7 (ref 5–15)
BUN: 19 mg/dL (ref 6–20)
CO2: 24 mmol/L (ref 22–32)
Calcium: 8.8 mg/dL — ABNORMAL LOW (ref 8.9–10.3)
Chloride: 105 mmol/L (ref 101–111)
Creatinine, Ser: 1.17 mg/dL (ref 0.61–1.24)
GFR calc Af Amer: >60 mL/min (ref >60)
GFR calc non Af Amer: >60 mL/min (ref >60)
Glucose, Bld: 94 mg/dL (ref 65–99)
POTASSIUM: 3.2 mmol/L — AB (ref 3.5–5.1)
Sodium: 136 mmol/L (ref 135–145)
TOTAL PROTEIN: 7.2 g/dL (ref 6.5–8.1)
Total Bilirubin: 0.7 mg/dL (ref 0.3–1.2)

## 2015-01-01 LAB — URINALYSIS, ROUTINE W REFLEX MICROSCOPIC
Bilirubin Urine: NEGATIVE
GLUCOSE, UA: NEGATIVE mg/dL
Ketones, ur: NEGATIVE mg/dL
Leukocytes, UA: NEGATIVE
Nitrite: NEGATIVE
PH: 5.5 (ref 5.0–8.0)
Protein, ur: 30 mg/dL — AB
SPECIFIC GRAVITY, URINE: 1.009 (ref 1.005–1.030)
Urobilinogen, UA: 0.2 mg/dL (ref 0.0–1.0)

## 2015-01-01 LAB — URINE MICROSCOPIC-ADD ON

## 2015-01-01 MED ORDER — POTASSIUM CHLORIDE CRYS ER 20 MEQ PO TBCR
40.0000 meq | EXTENDED_RELEASE_TABLET | Freq: Once | ORAL | Status: AC
  Administered 2015-01-01: 40 meq via ORAL
  Filled 2015-01-01: qty 2

## 2015-01-01 NOTE — ED Notes (Signed)
EMS called to home.  Found patient post itcal after a 2 minute seizure per wife. Patient wife states that he has a history of seizures.  Currently patient is A&O x3 On arrival to the ED

## 2015-01-01 NOTE — Discharge Instructions (Signed)
Seizure, Adult A seizure means there is unusual activity in the brain. A seizure can cause changes in attention or behavior. Seizures often cause shaking (convulsions). Seizures often last from 30 seconds to 2 minutes. HOME CARE   If you are given medicines, take them exactly as told by your doctor.  Keep all doctor visits as told.  Do not swim or drive until your doctor says it is okay.  Teach others what to do if you have a seizure. They should:  Lay you on the ground.  Put a cushion under your head.  Loosen any tight clothing around your neck.  Turn you on your side.  Stay with you until you get better. GET HELP RIGHT AWAY IF:   The seizure lasts longer than 2 to 5 minutes.  The seizure is very bad.  The person does not wake up after the seizure.  The person's attention or behavior changes. Drive the person to the emergency room or call your local emergency services (911 in [place] MAKE SURE YOU:   Understand these instructions.  Will watch your condition.  Will get help right away if you are not doing well or get worse. Document Released: 11/30/2007 Document Revised: 09/05/2011 Document Reviewed: 06/01/2011 [employer] Patient Information 2015 [employer]. This information is not intended to replace advice given to you by your health care provider. Make sure you discuss any questions you have with your health care provider.  

## 2015-01-01 NOTE — ED Notes (Signed)
Bed: VZ56 Expected date:  Expected time:  Means of arrival:  Comments: EMS 64yo M seizure

## 2015-01-01 NOTE — ED Provider Notes (Signed)
CSN: [id number]     Arrival date & time 01/01/15  0305 History   First MD Initiated Contact with Patient 01/01/15 [phone]     Chief Complaint  Patient presents with  . Seizures     (Consider location/radiation/quality/duration/timing/severity/associated sxs/prior Treatment) HPI Patient brought in by EMS for seizure-like activity lasting several minutes. Per old records patient has a history of pseudoseizures and is on no antiepileptics. Wife states the patient is followed by neurology at [facility]. Patient with questionable history ictal phase after shaking episode. He currently is complaining of no pain. States he's had some nasal congestion. No tongue biting. He is at his baseline mental status. Past Medical History  Diagnosis Date  . Hypertension   . Anxiety   . High blood pressure   . Multiple allergies   . Dizziness   . Seizures    History reviewed. No pertinent past surgical history. Family History  Problem Relation Age of Onset  . Dementia Mother   . Heart disease Father   . Parkinson's disease Sister   . Diabetes Sister   . Pancreatic cancer Sister   . Leukemia Sister    History  Substance Use Topics  . Smoking status: Never Smoker   . Smokeless tobacco: Not on file  . Alcohol Use: No    Review of Systems  Constitutional: Negative for fever and chills.  HENT: Positive for congestion.   Respiratory: Negative for cough and shortness of breath.   Cardiovascular: Negative for chest pain, palpitations and leg swelling.  Gastrointestinal: Negative for nausea, vomiting and abdominal pain.  Musculoskeletal: Negative for back pain, neck pain and neck stiffness.  Skin: Negative for rash and wound.  Neurological: Positive for seizures. Negative for dizziness, weakness, numbness and headaches.  All other systems reviewed and are negative.     Allergies  Almond oil; Corn oil; Salicylates; Amlodipine; Amphetamines; Aspirin; Dextroamphetamine; Felodipine; Indapamide; and  Iodinated diagnostic agents  Home Medications   Prior to Admission medications   Medication Sig Start Date End Date Taking? Authorizing Provider  azelastine (ASTELIN) 0.1 % nasal spray Place 2 sprays into both nostrils 2 (two) times daily. Use in each nostril as directed   Yes Historical Provider, MD  calcium-vitamin D (OSCAL WITH D) 500-200 MG-UNIT per tablet Take 1 tablet by mouth daily with breakfast.   Yes Historical Provider, MD  clonazePAM (KLONOPIN) 0.5 MG tablet Take 0.5 mg by mouth 2 (two) times daily as needed. For anxiety.    Yes Historical Provider, MD  cloNIDine (CATAPRES) 0.3 MG tablet Take 1 tablet by mouth 2 (two) times daily. Take if BP is 180/110. If BP is still >180/110 after 2 hours, repeat dose. 09/02/14  Yes Historical Provider, MD  fexofenadine (ALLEGRA) 60 MG tablet Take 60 mg by mouth 2 (two) times daily.   Yes Historical Provider, MD  lisinopril (PRINIVIL,ZESTRIL) 20 MG tablet Take 20 mg by mouth 2 (two) times daily.     Yes Historical Provider, MD  montelukast (SINGULAIR) 10 MG tablet Take 1 tablet by mouth every evening. 03/24/14  Yes Historical Provider, MD  Multiple Vitamin (MULTIVITAMIN WITH MINERALS) TABS tablet Take 1 tablet by mouth daily.   Yes Historical Provider, MD  omega-3 acid ethyl esters (LOVAZA) 1 G capsule Take 1 g by mouth 2 (two) times daily.   Yes Historical Provider, MD  oxyCODONE (OXY IR/ROXICODONE) 5 MG immediate release tablet Take 5 mg by mouth every 4 (four) hours as needed for moderate pain or severe pain.  Yes Historical Provider, MD  Potassium Gluconate 550 MG TABS Take 1 tablet by mouth every morning.   Yes Historical Provider, MD  sodium chloride (OCEAN) 0.65 % SOLN nasal spray Place 1 spray into both nostrils as needed for congestion.   Yes Historical Provider, MD  Sodium Chloride-Sodium Bicarb (AYR SALINE NASAL NETI RINSE NA) Place 1 application into the nose as needed (nasal rinse.).   Yes Historical Provider, MD   BP 196/115 mmHg  Pulse  91  Temp(Src) 98.9 F (37.2 C) (Oral)  Resp 18  SpO2 100% Physical Exam  Constitutional: He is oriented to person, place, and time. He appears well-developed and well-nourished. No distress.  HENT:  Head: Normocephalic and atraumatic.  Mouth/Throat: Oropharynx is clear and moist.  No intraoral trauma.  Eyes: EOM are normal. Pupils are equal, round, and reactive to light.  Neck: Normal range of motion. Neck supple.  No posterior neck tenderness to palpation. No meningismus.  Cardiovascular: Normal rate and regular rhythm.   Pulmonary/Chest: Effort normal and breath sounds normal. No respiratory distress. He has no wheezes. He has no rales.  Abdominal: Soft. Bowel sounds are normal. He exhibits no distension and no mass. There is no tenderness. There is no rebound and no guarding.  Musculoskeletal: Normal range of motion. He exhibits no edema or tenderness.  Neurological: He is alert and oriented to person, place, and time.  5/5 motor in all extremities. Sensation is fully intact.  Skin: Skin is warm and dry. No rash noted. No erythema.  Psychiatric: He has a normal mood and affect. His behavior is normal.  Nursing note and vitals reviewed.   ED Course  Procedures (including critical care time) Labs Review Labs Reviewed  CBC WITH DIFFERENTIAL/PLATELET - Abnormal; Notable for the following:    Neutrophils Relative % 83 (*)    Neutro Abs 8.3 (*)    Lymphocytes Relative 8 (*)    All other components within normal limits  COMPREHENSIVE METABOLIC PANEL - Abnormal; Notable for the following:    Potassium 3.2 (*)    Calcium 8.8 (*)    All other components within normal limits  URINALYSIS, ROUTINE W REFLEX MICROSCOPIC (NOT AT [facility]) - Abnormal; Notable for the following:    Color, Urine STRAW (*)    Hgb urine dipstick TRACE (*)    Protein, ur 30 (*)    All other components within normal limits  URINE MICROSCOPIC-ADD ON    Imaging Review No results found.   EKG  Interpretation   Date/Time:  Thursday January 01 2015 04:18:13 EDT Ventricular Rate:  94 PR Interval:  184 QRS Duration: 94 QT Interval:  345 QTC Calculation: 431 R Axis:   15 Text Interpretation:  Sinus rhythm Left atrial enlargement Abnormal R-wave  progression, early transition Confirmed by Lita Mains  MD, Sorcha Rotunno ([id number]) on  01/01/2015 5:18:16 AM      MDM   Final diagnoses:  Observed seizure-like activity   Patient is well-appearing. He is at his mental baseline. Questional history of seizures versus pseudoseizures. Has follow-up with [place] neurology. Has been on multiple antiepileptics in the past but did not tolerate the side effects.    Julianne Rice, MD 01/01/15 [phone]

## 2015-01-01 NOTE — ED Notes (Signed)
Patient is alert and oriented x3.  He was given DC instructions and follow up visit instructions.  Patient gave verbal understanding.  He was DC ambulatory under his own power to home.  V/S stable.  He was not showing any signs of distress on DC 

## 2015-06-03 DIAGNOSIS — R6889 Other general symptoms and signs: Secondary | ICD-10-CM

## 2015-06-03 DIAGNOSIS — IMO0001 Reserved for inherently not codable concepts without codable children: Secondary | ICD-10-CM | POA: Insufficient documentation

## 2016-02-17 ENCOUNTER — Emergency Department (HOSPITAL_COMMUNITY): Payer: Medicare Other

## 2016-02-17 ENCOUNTER — Encounter (HOSPITAL_COMMUNITY): Payer: Self-pay | Admitting: Emergency Medicine

## 2016-02-17 ENCOUNTER — Emergency Department (HOSPITAL_COMMUNITY)
Admission: EM | Admit: 2016-02-17 | Discharge: 2016-02-18 | Disposition: A | Discharge status: Home / Self Care | Payer: Medicare Other | Attending: Emergency Medicine | Admitting: Emergency Medicine

## 2016-02-17 DIAGNOSIS — W1839XA Other fall on same level, initial encounter: Secondary | ICD-10-CM | POA: Insufficient documentation

## 2016-02-17 DIAGNOSIS — I10 Essential (primary) hypertension: Secondary | ICD-10-CM

## 2016-02-17 DIAGNOSIS — Y939 Activity, unspecified: Secondary | ICD-10-CM | POA: Diagnosis not present

## 2016-02-17 DIAGNOSIS — M542 Cervicalgia: Secondary | ICD-10-CM | POA: Diagnosis not present

## 2016-02-17 DIAGNOSIS — S0083XA Contusion of other part of head, initial encounter: Secondary | ICD-10-CM | POA: Diagnosis not present

## 2016-02-17 DIAGNOSIS — R569 Unspecified convulsions: Secondary | ICD-10-CM

## 2016-02-17 DIAGNOSIS — Y999 Unspecified external cause status: Secondary | ICD-10-CM | POA: Insufficient documentation

## 2016-02-17 DIAGNOSIS — G40909 Epilepsy, unspecified, not intractable, without status epilepticus: Secondary | ICD-10-CM | POA: Insufficient documentation

## 2016-02-17 DIAGNOSIS — Z79899 Other long term (current) drug therapy: Secondary | ICD-10-CM | POA: Insufficient documentation

## 2016-02-17 DIAGNOSIS — Y9289 Other specified places as the place of occurrence of the external cause: Secondary | ICD-10-CM | POA: Insufficient documentation

## 2016-02-17 DIAGNOSIS — W19XXXA Unspecified fall, initial encounter: Secondary | ICD-10-CM

## 2016-02-17 LAB — URINE MICROSCOPIC-ADD ON
Squamous Epithelial / LPF: NONE SEEN
WBC, UA: NONE SEEN WBC/hpf (ref 0–5)

## 2016-02-17 LAB — URINALYSIS, ROUTINE W REFLEX MICROSCOPIC
BILIRUBIN URINE: NEGATIVE
Glucose, UA: NEGATIVE mg/dL
Ketones, ur: NEGATIVE mg/dL
Leukocytes, UA: NEGATIVE
Nitrite: NEGATIVE
Protein, ur: 30 mg/dL — AB
Specific Gravity, Urine: 1.012 (ref 1.005–1.030)
pH: 7.5 (ref 5.0–8.0)

## 2016-02-17 MED ORDER — CLONIDINE HCL 0.1 MG PO TABS
0.3000 mg | ORAL_TABLET | Freq: Once | ORAL | Status: AC
  Administered 2016-02-17: 0.3 mg via ORAL
  Filled 2016-02-17: qty 3

## 2016-02-17 NOTE — ED Provider Notes (Signed)
[facility] DEPT Provider Note   CSN: [id number] Arrival date & time: 02/17/16  2202  By signing my name below, I, Noah Rose, attest that this documentation has been prepared under the direction and in the presence of [name], MD.  Electronically Signed: [name], ED Scribe. 02/17/16. 11:27 PM.    History   Chief Complaint Chief Complaint  Patient presents with  . Fall    The history is provided by the patient. No language interpreter was used.   HPI Comments: Noah Rose is a 65 y.o. male who has a PMhx of dizziness, HTN, seizures presents to the Emergency Department presenting after two falls that occurred twice today. He complains of neck soreness and has a large knot on the right side of his forehead.Wife says pt fell twice this morning and once this evening. Wife states pt was at the computer and wife heard him fall from the other room. Wife says she found pt unresponsive and tried to get him on his side until he arose to baseline. He was ambulatory after the fall occurred and wife says that he was not confused and knew where he was. She says she believes that his falls were due to do seizures that he has been followed for by other hospitals. Pt is not currently on any anti-convulsing medication due to the side effects. He is not on any blood thinners and wife says pt is not compliant with his blood pressure medication. He denies headache.  I have reviewed patient's [facility] chart. Previously he was on on feet for seizures. He is very reluctant to be on seizure medication. He does have an EEG that was abnormal.  Past Medical History:  Diagnosis Date  . Anxiety   . Dizziness   . High blood pressure   . Hypertension   . Multiple allergies   . Seizures [facility])     Patient Active Problem List   Diagnosis Date Noted  . Syncope 07/28/2012  . Hypertension 07/28/2012  . OSA (obstructive sleep apnea) 07/28/2012  . Chronic allergic rhinitis 07/28/2012  .  Chronic anxiety 07/28/2012  . 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 No pertinent surgical history.     Home Medications    Prior to Admission medications   Medication Sig Start Date End Date Taking? Authorizing Provider  calcium-vitamin D (OSCAL WITH D) 500-200 MG-UNIT per tablet Take 1 tablet by mouth daily with breakfast.   Yes Historical Provider, MD  clonazePAM (KLONOPIN) 0.5 MG tablet Take 0.5 mg by mouth 2 (two) times daily as needed. For anxiety.    Yes Historical Provider, MD  levocetirizine (XYZAL) 5 MG tablet Take 5 mg by mouth every evening.   Yes Historical Provider, MD  MAGNESIUM PO Take 1 tablet by mouth daily.   Yes Historical Provider, MD  Multiple Vitamin (MULTIVITAMIN WITH MINERALS) TABS tablet Take 1 tablet by mouth daily.   Yes Historical Provider, MD  omega-3 acid ethyl esters (LOVAZA) 1 G capsule Take 1 g by mouth 2 (two) times daily.   Yes Historical Provider, MD  Potassium Gluconate 550 MG TABS Take 1 tablet by mouth every morning.   Yes Historical Provider, MD  sodium chloride (OCEAN) 0.65 % SOLN nasal spray Place 1 spray into both nostrils as needed for congestion.   Yes Historical Provider, MD  cloNIDine (CATAPRES) 0.3 MG tablet Take 1 tablet (0.3 mg total) by mouth 2 (two) times daily. Take if BP is 180/110. If BP is still >180/110 after  2 hours, repeat dose. 02/18/16   [name], MD  lisinopril (PRINIVIL,ZESTRIL) 20 MG tablet Take 1 tablet (20 mg total) by mouth daily. 02/18/16   [name], MD    Family History Family History  Problem Relation Age of Onset  . Dementia Mother   . Heart disease Father   . Parkinson's disease Sister   . Diabetes Sister   . Pancreatic cancer Sister   . Leukemia Sister     Social History Social History  Substance Use Topics  . Smoking status: Never Smoker  . Smokeless tobacco: Never Used  . Alcohol use No     Allergies   Almond oil; Corn oil; Salicylates; Amlodipine; Amphetamines; Aspirin;  Dextroamphetamine; Felodipine; Indapamide; and Iodinated diagnostic agents   Review of Systems Review of Systems  Constitutional: Negative for fever.  Respiratory: Negative for shortness of breath.   Cardiovascular: Negative for chest pain.  Musculoskeletal: Positive for neck pain (neck soreness).  Skin: Positive for wound.  Neurological: Positive for seizures. Negative for headaches.  All other systems reviewed and are negative.    Physical Exam Updated Vital Signs BP (!) 166/103 (BP Location: Left Arm)   Pulse 77   Temp 99.6 F (37.6 C) (Oral)   Resp 14   SpO2 98%   Physical Exam  Constitutional: He is oriented to person, place, and time. No distress.  HENT:  Head: Normocephalic.  Hematoma noted to the right forehead  Eyes: Pupils are equal, round, and reactive to light.  Neck: Normal range of motion. Neck supple.  Tenderness palpation midline lower C-spine and paraspinous muscle region  Cardiovascular: Normal rate, regular rhythm and normal heart sounds.   No murmur heard. Pulmonary/Chest: Effort normal and breath sounds normal. No respiratory distress. He has no wheezes.  Abdominal: Soft. Bowel sounds are normal. There is no tenderness. There is no rebound.  Musculoskeletal: He exhibits no edema.  Neurological: He is alert and oriented to person, place, and time.  Cranial nerves II through XII intact, 5 out of 5 strength in all 4 extremities, normal reflexes  Skin: Skin is warm and dry.  Psychiatric: He has a normal mood and affect.  Nursing note and vitals reviewed.    ED Treatments / Results  DIAGNOSTIC STUDIES: Oxygen Saturation is 100% on RA, normal by my interpretation.  COORDINATION OF CARE:  11:24 PM Discussed treatment plan with pt at bedside and pt agreed to plan.  Labs (all labs ordered are listed, but only abnormal results are displayed) Labs Reviewed  CBC WITH DIFFERENTIAL/PLATELET - Abnormal; Notable for the following:       Result Value   WBC  10.8 (*)    Neutro Abs 8.9 (*)    All other components within normal limits  COMPREHENSIVE METABOLIC PANEL - Abnormal; Notable for the following:    Glucose, Bld 116 (*)    BUN 21 (*)    Calcium 8.8 (*)    All other components within normal limits  URINALYSIS, ROUTINE W REFLEX MICROSCOPIC (NOT AT [facility]) - Abnormal; Notable for the following:    Hgb urine dipstick TRACE (*)    Protein, ur 30 (*)    All other components within normal limits  URINE MICROSCOPIC-ADD ON - Abnormal; Notable for the following:    Bacteria, UA RARE (*)    All other components within normal limits    EKG  EKG Interpretation None       Radiology Ct Head Wo Contrast  Result Date: 02/17/2016 CLINICAL  DATA:  65 y/o M; two false today with possible seizures. Injury to the right forehead. EXAM: CT HEAD WITHOUT CONTRAST TECHNIQUE: Contiguous axial images were obtained from the base of the skull through the vertex without intravenous contrast. COMPARISON:  CT head 10/24/2013 and MR brain 11/10/2006 FINDINGS: Brain: No evidence of acute infarction, hemorrhage, hydrocephalus, extra-axial collection or mass lesion/mass effect. No significant interval change. Vascular: No hyperdense vessel. Mild calcifications of cavernous internal carotid arteries. Skull: Small contusion to the right frontal scalp. No skull fracture is identified. Sinuses/Orbits: No acute finding. Other: None. IMPRESSION: No acute intracranial abnormality is identified. No significant interval change. Electronically Signed   By: Kristine Garbe M.D.   On: 02/17/2016 23:28   Ct Cervical Spine Wo Contrast  Result Date: 02/18/2016 CLINICAL DATA:  Fall with neck soreness.  Initial encounter. EXAM: CT CERVICAL SPINE WITHOUT CONTRAST TECHNIQUE: Multidetector CT imaging of the cervical spine was performed without intravenous contrast. Multiplanar CT image reconstructions were also generated. COMPARISON:  None. FINDINGS: Alignment: Normal Skull base and  vertebrae: No acute fracture. A lucency in the C4 left articular process on sagittal reformats is a small channel on axial images. No aggressive process. Incidental pseudoarticulation between the C1 transverse processes and the occiput. Soft tissues and canal: No prevertebral fluid. No gross canal hematoma. Degenerative: Negative for age. IMPRESSION: No evidence of cervical spine injury. Electronically Signed   By: Monte Fantasia M.D.   On: 02/18/2016 00:31    Procedures Procedures (including critical care time)  Medications Ordered in ED Medications  cloNIDine (CATAPRES) tablet 0.3 mg (0.3 mg Oral Given 02/17/16 2354)     Initial Impression / Assessment and Plan / ED Course  I have reviewed the triage vital signs and the nursing notes.  Pertinent labs & imaging results that were available during my care of the patient were reviewed by me and considered in my medical decision making (see chart for details).  Clinical Course    Patient presents with history suggestive of seizure resulting in fall and minor head injury. He is currently awake, alert, and oriented. He is a poor historian and does sometimes appear slow to answer but is oriented. I have reviewed his chart. He is reluctant to take seizure medications and does not believe he has a seizure disorder. Lab work is largely reassuring. He was notably hypertensive. Wife states that he is not compliant with his blood pressure medications. He was given his home clonidine and repeat blood pressure 166/103. CT head and neck reassuring. I discussed with the patient and wife my concerns that he likely did have a seizure. Patient continues to deny history of seizures and does not believe he has seizures. I have encouraged follow-up with neurology at [facility]. They have follow-up in hypertension clinic on Thursday.  I have provided prescriptions for his blood pressure medications and have encouraged compliance.  After history, exam, and medical workup I  feel the patient has been appropriately medically screened and is safe for discharge home. Pertinent diagnoses were discussed with the patient. Patient was given return precautions.   Final Clinical Impressions(s) / ED Diagnoses   Final diagnoses:  Seizure [facility])  Essential hypertension  Fall, initial encounter    New Prescriptions Current Discharge Medication List     I personally performed the services described in this documentation, which was scribed in my presence. The recorded information has been reviewed and is accurate.    [name], MD 02/18/16 [phone]

## 2016-02-17 NOTE — ED Triage Notes (Signed)
Pt has had 2 falls today, possible seizures, per his family member  Pt says he does not have seizures, family arguing with pt stating he does  The falls were unwitnessed and the family states she heard him fall out of a chair and went in to find him on the floor unresponsive  She states it took her a few minutes to get him to respond but then he was confused  Family states when she went in his feet were twitching  She says about 15 minutes after the fall he was more oriented  Pt has a knot on his forehead on the right side and states his neck is sore

## 2016-02-18 ENCOUNTER — Emergency Department (HOSPITAL_COMMUNITY): Payer: Medicare Other

## 2016-02-18 DIAGNOSIS — G40909 Epilepsy, unspecified, not intractable, without status epilepticus: Secondary | ICD-10-CM | POA: Diagnosis not present

## 2016-02-18 LAB — CBC WITH DIFFERENTIAL/PLATELET
Basophils Absolute: 0 10*3/uL (ref 0.0–0.1)
Basophils Relative: 0 %
EOS ABS: 0.1 10*3/uL (ref 0.0–0.7)
Eosinophils Relative: 1 %
HCT: 43.8 % (ref 39.0–52.0)
HEMOGLOBIN: 14.5 g/dL (ref 13.0–17.0)
LYMPHS ABS: 1 10*3/uL (ref 0.7–4.0)
Lymphocytes Relative: 10 %
MCH: 27.3 pg (ref 26.0–34.0)
MCHC: 33.1 g/dL (ref 30.0–36.0)
MCV: 82.3 fL (ref 78.0–100.0)
Monocytes Absolute: 0.7 10*3/uL (ref 0.1–1.0)
Monocytes Relative: 7 %
NEUTROS ABS: 8.9 10*3/uL — AB (ref 1.7–7.7)
NEUTROS PCT: 82 %
Platelets: 176 10*3/uL (ref 150–400)
RBC: 5.32 MIL/uL (ref 4.22–5.81)
RDW: 13.9 % (ref 11.5–15.5)
WBC: 10.8 10*3/uL — ABNORMAL HIGH (ref 4.0–10.5)

## 2016-02-18 LAB — COMPREHENSIVE METABOLIC PANEL
ALBUMIN: 4.7 g/dL (ref 3.5–5.0)
ALK PHOS: 79 U/L (ref 38–126)
ALT: 25 U/L (ref 17–63)
AST: 33 U/L (ref 15–41)
Anion gap: 5 (ref 5–15)
BUN: 21 mg/dL — AB (ref 6–20)
CO2: 28 mmol/L (ref 22–32)
CREATININE: 1 mg/dL (ref 0.61–1.24)
Calcium: 8.8 mg/dL — ABNORMAL LOW (ref 8.9–10.3)
Chloride: 103 mmol/L (ref 101–111)
GFR calc Af Amer: >60 mL/min (ref >60)
GFR calc non Af Amer: >60 mL/min (ref >60)
GLUCOSE: 116 mg/dL — AB (ref 65–99)
Potassium: 3.5 mmol/L (ref 3.5–5.1)
SODIUM: 136 mmol/L (ref 135–145)
Total Bilirubin: 0.5 mg/dL (ref 0.3–1.2)
Total Protein: 7.5 g/dL (ref 6.5–8.1)

## 2016-02-18 MED ORDER — LISINOPRIL 20 MG PO TABS: 20.0000 mg | ORAL_TABLET | Freq: Every day | ORAL | 0 refills | Status: DC

## 2016-02-18 MED ORDER — CLONIDINE HCL 0.3 MG PO TABS: 0.3000 mg | ORAL_TABLET | Freq: Two times a day (BID) | ORAL | 0 refills | Status: DC

## 2016-02-18 NOTE — ED Notes (Signed)
Patient transported to CT 

## 2016-02-18 NOTE — ED Notes (Signed)
Pt ambulated from room to restroom and back to room with NAD.

## 2016-02-18 NOTE — ED Notes (Signed)
MD at bedside. 

## 2016-02-18 NOTE — Discharge Instructions (Signed)
You were seen today after a suspected seizure. He was also noted to be hypertensive. It is very important that you take a blood pressure medications. Your history strongly suggestive that you have a seizure disorder and I would encourage you to follow-up with neurology to discussed restarting medications.

## 2016-02-18 NOTE — ED Notes (Signed)
Pt ambulatory and independent at discharge.  Verbalized understanding of discharge instructions 

## 2016-02-26 HISTORY — PX: HERNIA REPAIR: SHX51

## 2016-03-15 ENCOUNTER — Observation Stay (HOSPITAL_COMMUNITY)
Admission: EM | Admit: 2016-03-15 | Discharge: 2016-03-17 | Disposition: A | Discharge status: Home / Self Care | Payer: Medicare Other | Attending: Internal Medicine | Admitting: Internal Medicine

## 2016-03-15 ENCOUNTER — Inpatient Hospital Stay (HOSPITAL_COMMUNITY): Payer: Medicare Other

## 2016-03-15 ENCOUNTER — Emergency Department (HOSPITAL_COMMUNITY): Payer: Medicare Other

## 2016-03-15 ENCOUNTER — Other Ambulatory Visit: Payer: Self-pay

## 2016-03-15 ENCOUNTER — Encounter (HOSPITAL_COMMUNITY): Payer: Self-pay

## 2016-03-15 DIAGNOSIS — F419 Anxiety disorder, unspecified: Secondary | ICD-10-CM | POA: Diagnosis not present

## 2016-03-15 DIAGNOSIS — S0280XA Fracture of other specified skull and facial bones, unspecified side, initial encounter for closed fracture: Secondary | ICD-10-CM | POA: Diagnosis not present

## 2016-03-15 DIAGNOSIS — S0219XA Other fracture of base of skull, initial encounter for closed fracture: Secondary | ICD-10-CM | POA: Insufficient documentation

## 2016-03-15 DIAGNOSIS — S01111A Laceration without foreign body of right eyelid and periocular area, initial encounter: Secondary | ICD-10-CM | POA: Diagnosis not present

## 2016-03-15 DIAGNOSIS — Z79899 Other long term (current) drug therapy: Secondary | ICD-10-CM | POA: Insufficient documentation

## 2016-03-15 DIAGNOSIS — M6281 Muscle weakness (generalized): Secondary | ICD-10-CM

## 2016-03-15 DIAGNOSIS — S0231XA Fracture of orbital floor, right side, initial encounter for closed fracture: Secondary | ICD-10-CM | POA: Diagnosis not present

## 2016-03-15 DIAGNOSIS — G4733 Obstructive sleep apnea (adult) (pediatric): Secondary | ICD-10-CM | POA: Diagnosis not present

## 2016-03-15 DIAGNOSIS — R350 Frequency of micturition: Secondary | ICD-10-CM | POA: Diagnosis not present

## 2016-03-15 DIAGNOSIS — J0100 Acute maxillary sinusitis, unspecified: Secondary | ICD-10-CM | POA: Insufficient documentation

## 2016-03-15 DIAGNOSIS — W07XXXA Fall from chair, initial encounter: Secondary | ICD-10-CM | POA: Insufficient documentation

## 2016-03-15 DIAGNOSIS — J309 Allergic rhinitis, unspecified: Secondary | ICD-10-CM

## 2016-03-15 DIAGNOSIS — Z9114 Patient's other noncompliance with medication regimen: Secondary | ICD-10-CM | POA: Diagnosis not present

## 2016-03-15 DIAGNOSIS — I160 Hypertensive urgency: Secondary | ICD-10-CM | POA: Diagnosis not present

## 2016-03-15 DIAGNOSIS — S0285XA Fracture of orbit, unspecified, initial encounter for closed fracture: Secondary | ICD-10-CM | POA: Diagnosis present

## 2016-03-15 DIAGNOSIS — S0240EA Zygomatic fracture, right side, initial encounter for closed fracture: Secondary | ICD-10-CM | POA: Insufficient documentation

## 2016-03-15 DIAGNOSIS — I10 Essential (primary) hypertension: Secondary | ICD-10-CM | POA: Diagnosis not present

## 2016-03-15 DIAGNOSIS — R339 Retention of urine, unspecified: Secondary | ICD-10-CM | POA: Diagnosis present

## 2016-03-15 DIAGNOSIS — R569 Unspecified convulsions: Secondary | ICD-10-CM

## 2016-03-15 DIAGNOSIS — S0292XA Unspecified fracture of facial bones, initial encounter for closed fracture: Secondary | ICD-10-CM | POA: Diagnosis present

## 2016-03-15 DIAGNOSIS — S0240CA Maxillary fracture, right side, initial encounter for closed fracture: Secondary | ICD-10-CM | POA: Insufficient documentation

## 2016-03-15 DIAGNOSIS — Y92003 Bedroom of unspecified non-institutional (private) residence as the place of occurrence of the external cause: Secondary | ICD-10-CM | POA: Diagnosis not present

## 2016-03-15 DIAGNOSIS — R2689 Other abnormalities of gait and mobility: Secondary | ICD-10-CM

## 2016-03-15 DIAGNOSIS — S02402D Zygomatic fracture, unspecified, subsequent encounter for fracture with routine healing: Secondary | ICD-10-CM | POA: Diagnosis not present

## 2016-03-15 DIAGNOSIS — E876 Hypokalemia: Secondary | ICD-10-CM | POA: Insufficient documentation

## 2016-03-15 DIAGNOSIS — G40209 Localization-related (focal) (partial) symptomatic epilepsy and epileptic syndromes with complex partial seizures, not intractable, without status epilepticus: Secondary | ICD-10-CM | POA: Diagnosis not present

## 2016-03-15 DIAGNOSIS — D72829 Elevated white blood cell count, unspecified: Secondary | ICD-10-CM

## 2016-03-15 LAB — BASIC METABOLIC PANEL
Anion gap: 10 (ref 5–15)
BUN: 14 mg/dL (ref 6–20)
CALCIUM: 9 mg/dL (ref 8.9–10.3)
CO2: 24 mmol/L (ref 22–32)
CREATININE: 1.07 mg/dL (ref 0.61–1.24)
Chloride: 103 mmol/L (ref 101–111)
GFR calc non Af Amer: >60 mL/min (ref >60)
Glucose, Bld: 96 mg/dL (ref 65–99)
Potassium: 3.5 mmol/L (ref 3.5–5.1)
SODIUM: 137 mmol/L (ref 135–145)

## 2016-03-15 LAB — TROPONIN I: Troponin I: <0.03 ng/mL (ref <0.03)

## 2016-03-15 LAB — PROTIME-INR
INR: 1.15
PROTHROMBIN TIME: 14.8 s (ref 11.4–15.2)

## 2016-03-15 LAB — CBC
HCT: 45.4 % (ref 39.0–52.0)
Hemoglobin: 15 g/dL (ref 13.0–17.0)
MCH: 27.5 pg (ref 26.0–34.0)
MCHC: 33 g/dL (ref 30.0–36.0)
MCV: 83.2 fL (ref 78.0–100.0)
PLATELETS: 211 10*3/uL (ref 150–400)
RBC: 5.46 MIL/uL (ref 4.22–5.81)
RDW: 13.9 % (ref 11.5–15.5)
WBC: 11 10*3/uL — ABNORMAL HIGH (ref 4.0–10.5)

## 2016-03-15 LAB — URINALYSIS, ROUTINE W REFLEX MICROSCOPIC
BILIRUBIN URINE: NEGATIVE
GLUCOSE, UA: NEGATIVE mg/dL
HGB URINE DIPSTICK: NEGATIVE
Ketones, ur: NEGATIVE mg/dL
Leukocytes, UA: NEGATIVE
Nitrite: NEGATIVE
PH: 7.5 (ref 5.0–8.0)
Protein, ur: NEGATIVE mg/dL
SPECIFIC GRAVITY, URINE: 1.006 (ref 1.005–1.030)

## 2016-03-15 LAB — MRSA PCR SCREENING: MRSA BY PCR: NEGATIVE

## 2016-03-15 MED ORDER — PHENYTOIN SODIUM EXTENDED 100 MG PO CAPS
300.0000 mg | ORAL_CAPSULE | Freq: Every day | ORAL | Status: DC
  Administered 2016-03-15 – 2016-03-16 (×2): 300 mg via ORAL
  Filled 2016-03-15 (×2): qty 3

## 2016-03-15 MED ORDER — TRAMADOL HCL 50 MG PO TABS: 50.0000 mg | ORAL_TABLET | Freq: Four times a day (QID) | ORAL | Status: DC | PRN

## 2016-03-15 MED ORDER — ONDANSETRON HCL 4 MG/2ML IJ SOLN: 4.0000 mg | Freq: Four times a day (QID) | INTRAMUSCULAR | Status: DC | PRN

## 2016-03-15 MED ORDER — SODIUM CHLORIDE 0.9% FLUSH
3.0000 mL | Freq: Two times a day (BID) | INTRAVENOUS | Status: DC
  Administered 2016-03-15 – 2016-03-17 (×3): 3 mL via INTRAVENOUS

## 2016-03-15 MED ORDER — LISINOPRIL 20 MG PO TABS
20.0000 mg | ORAL_TABLET | Freq: Every day | ORAL | Status: DC
  Administered 2016-03-15 – 2016-03-17 (×3): 20 mg via ORAL
  Filled 2016-03-15 (×2): qty 1

## 2016-03-15 MED ORDER — LORATADINE 10 MG PO TABS
10.0000 mg | ORAL_TABLET | Freq: Every day | ORAL | Status: DC
  Administered 2016-03-16 – 2016-03-17 (×2): 10 mg via ORAL
  Filled 2016-03-15 (×2): qty 1

## 2016-03-15 MED ORDER — LORAZEPAM 2 MG/ML IJ SOLN
INTRAMUSCULAR | Status: AC
  Filled 2016-03-15: qty 1

## 2016-03-15 MED ORDER — NICARDIPINE HCL IN NACL 20-0.86 MG/200ML-% IV SOLN: 3.0000 mg/h | Freq: Once | INTRAVENOUS | Status: DC

## 2016-03-15 MED ORDER — CLONIDINE HCL 0.2 MG PO TABS: 0.2000 mg | ORAL_TABLET | Freq: Once | ORAL | Status: DC

## 2016-03-15 MED ORDER — SODIUM CHLORIDE 0.9 % IV SOLN
INTRAVENOUS | Status: DC
  Administered 2016-03-15: 22:00:00 via INTRAVENOUS

## 2016-03-15 MED ORDER — LORAZEPAM 2 MG/ML IJ SOLN
1.0000 mg | Freq: Once | INTRAMUSCULAR | Status: AC
  Administered 2016-03-15: 1 mg via INTRAVENOUS

## 2016-03-15 MED ORDER — LISINOPRIL 20 MG PO TABS
20.0000 mg | ORAL_TABLET | Freq: Once | ORAL | Status: DC
  Filled 2016-03-15: qty 1

## 2016-03-15 MED ORDER — CLONAZEPAM 0.5 MG PO TABS: 0.5000 mg | ORAL_TABLET | Freq: Two times a day (BID) | ORAL | Status: DC | PRN

## 2016-03-15 MED ORDER — SODIUM CHLORIDE 0.9 % IV SOLN
1250.0000 mg | Freq: Once | INTRAVENOUS | Status: AC
  Administered 2016-03-15: 1250 mg via INTRAVENOUS
  Filled 2016-03-15: qty 25

## 2016-03-15 MED ORDER — PHENYTOIN 50 MG PO CHEW: 300.0000 mg | CHEWABLE_TABLET | Freq: Every day | ORAL | Status: DC

## 2016-03-15 MED ORDER — LORAZEPAM 2 MG/ML IJ SOLN: 1.0000 mg | INTRAMUSCULAR | Status: DC | PRN

## 2016-03-15 MED ORDER — LABETALOL HCL 5 MG/ML IV SOLN
10.0000 mg | Freq: Once | INTRAVENOUS | Status: AC
  Administered 2016-03-15: 10 mg via INTRAVENOUS
  Filled 2016-03-15: qty 4

## 2016-03-15 MED ORDER — MAGNESIUM CITRATE PO SOLN: 1.0000 | Freq: Once | ORAL | Status: DC | PRN

## 2016-03-15 MED ORDER — ACETAMINOPHEN 650 MG RE SUPP: 650.0000 mg | Freq: Four times a day (QID) | RECTAL | Status: DC | PRN

## 2016-03-15 MED ORDER — LABETALOL HCL 5 MG/ML IV SOLN
20.0000 mg | Freq: Once | INTRAVENOUS | Status: AC
  Administered 2016-03-15: 20 mg via INTRAVENOUS
  Filled 2016-03-15: qty 4

## 2016-03-15 MED ORDER — ONDANSETRON HCL 4 MG PO TABS: 4.0000 mg | ORAL_TABLET | Freq: Four times a day (QID) | ORAL | Status: DC | PRN

## 2016-03-15 MED ORDER — CALCIUM CARBONATE-VITAMIN D 500-200 MG-UNIT PO TABS
1.0000 | ORAL_TABLET | Freq: Every day | ORAL | Status: DC
  Administered 2016-03-16 – 2016-03-17 (×2): 1 via ORAL
  Filled 2016-03-15 (×2): qty 1

## 2016-03-15 MED ORDER — ACETAMINOPHEN 325 MG PO TABS
650.0000 mg | ORAL_TABLET | Freq: Four times a day (QID) | ORAL | Status: DC | PRN
  Filled 2016-03-15: qty 2

## 2016-03-15 MED ORDER — ADULT MULTIVITAMIN W/MINERALS CH
1.0000 | ORAL_TABLET | Freq: Every day | ORAL | Status: DC
  Administered 2016-03-15 – 2016-03-17 (×2): 1 via ORAL
  Filled 2016-03-15 (×3): qty 1

## 2016-03-15 MED ORDER — SORBITOL 70 % SOLN: 30.0000 mL | Freq: Every day | Status: DC | PRN

## 2016-03-15 MED ORDER — SALINE SPRAY 0.65 % NA SOLN: 1.0000 | NASAL | Status: DC | PRN

## 2016-03-15 MED ORDER — METOPROLOL TARTRATE 25 MG PO TABS: 25.0000 mg | ORAL_TABLET | Freq: Two times a day (BID) | ORAL | Status: DC

## 2016-03-15 MED ORDER — OMEGA-3-ACID ETHYL ESTERS 1 G PO CAPS
1.0000 g | ORAL_CAPSULE | Freq: Two times a day (BID) | ORAL | Status: DC
  Administered 2016-03-15 – 2016-03-17 (×4): 1 g via ORAL
  Filled 2016-03-15 (×4): qty 1

## 2016-03-15 MED ORDER — LEVOCETIRIZINE DIHYDROCHLORIDE 5 MG PO TABS: 5.0000 mg | ORAL_TABLET | Freq: Every evening | ORAL | Status: DC

## 2016-03-15 MED ORDER — SENNOSIDES-DOCUSATE SODIUM 8.6-50 MG PO TABS: 1.0000 | ORAL_TABLET | Freq: Every evening | ORAL | Status: DC | PRN

## 2016-03-15 MED ORDER — HYDRALAZINE HCL 20 MG/ML IJ SOLN
10.0000 mg | INTRAMUSCULAR | Status: DC | PRN
  Administered 2016-03-15: 10 mg via INTRAVENOUS
  Filled 2016-03-15: qty 1

## 2016-03-15 MED ORDER — MAGNESIUM OXIDE 400 (241.3 MG) MG PO TABS
200.0000 mg | ORAL_TABLET | Freq: Every day | ORAL | Status: DC
  Administered 2016-03-15 – 2016-03-17 (×3): 200 mg via ORAL
  Filled 2016-03-15 (×3): qty 1

## 2016-03-15 NOTE — ED Notes (Signed)
MD at bedside. 

## 2016-03-15 NOTE — ED Triage Notes (Signed)
Patient complains of fall this am while getting up to go to the bathroom. Doesn't remember falling, arrived with laceration to right eye, bruising and swelling to right eye with redness to sclera. Slow to respond to questions.

## 2016-03-15 NOTE — ED Notes (Signed)
Pt was being transported to xray and  Had a seizure in the hallway lasting approximately one minute. Patient eyes and upper body was shaking and patient was unresponsive. Immediately after seizure-like activity patient became confused and combative trying to get out of bed. Patient alert, not oriented at present time. Breathing spontaneous with airway intact.

## 2016-03-15 NOTE — ED Notes (Signed)
Pt had a witnessed seizure prior to transport to CT lasting approx 1 minute. Pt postictal at this time. Pt oriented to self.

## 2016-03-15 NOTE — ED Notes (Signed)
Cleaned Pt and changed linens with Suezanne Cheshire RN.

## 2016-03-15 NOTE — H&P (Signed)
History and Physical    Noah Rose [id number] DOB: 17-Sep-1950 DOA: 03/15/2016  PCP: Elyn Peers, MD  Patient coming from: Home  Chief Complaint: ?seizures  HPI: Noah Rose is a 65 y.o. male with medical history significant of hypertension, medication noncompliance, allergies, seizure disorder, obstructive sleep apnea not on a sleep apnea machine due to intolerance presented to the ED with probable seizure episode. Patient's wife states that for several months now patient has had some increased urination from time to time where he has increased urinary urgency and also noted that he's been hypertensive. Patient not taking his medications as prescribed due to complaints of various intolerances to medications. Per patient's wife patient was change in the cartilage on the printed this morning when she heard a loud crash and came running from the kitchen and noted that patient was facedown in the pool of blood and initially lethargic but slowly became more responsive with associated bladder incontinence. Patient's and her daughter since the patient and it was recommended the patient be brought to the emergency room. Patient denies any recent fevers, no chills, no nausea, no vomiting, no chest, no shortness of breath, no abdominal pain, no diarrhea, no constipation, no dysuria, no weakness, no cough, no melena, no hematemesis, no hematochezia. Patient was seen in the ED and per ED physician on route to CT patient was noted to have another seizure. Patient was given some Ativan with improvement.ED physician states neurology is consulted on the patient had initially recommended that nicardipine drip. Pulmonary was consulted who felt that patient's  Hypertensive urgency was more chronic in nature and no need for admission to the ICU and a such [facility].  ED Course: the ED CThead and orbits showed no acute intracranial abnormality. Multiple fractures in the  right face and orbits. Air-fluid level right maxillary sinus compatible with blood.basic metabolic profile unremarkable. Troponin less than 0.03. CBC with a white count of 11 otherwise within normal limits.urinalysis was not done. Chest x-ray not done.  Review of Systems: As per HPI otherwise 10 point review of systems negative.  Past Medical History:  Diagnosis Date  . Anxiety   . Dizziness   . High blood pressure   . Hypertension   . Multiple allergies   . Seizures ([place])     History reviewed. No pertinent surgical history.   reports that he has never smoked. He has never used smokeless tobacco. He reports that he does not drink alcohol or use drugs.  Allergies  Allergen Reactions  . Almond Oil Shortness Of Breath  . Corn Oil Shortness Of Breath  . Dust Mite Mixed Allergen Ext [Mite (D. Farinae)] Shortness Of Breath  . Salicylates Shortness Of Breath and Other (See Comments)  . Amlodipine     dizziness  . Amphetamines     dizziness  . Aspirin     dizziness  . Dextroamphetamine     dizziness  . Felodipine     dizziness  . Indapamide     dizziness  . Iodinated Diagnostic Agents Other (See Comments)    Uncoded Allergy. Allergen: Preservatives, Other Reaction: HTN CRISIS Uncoded Allergy. Allergen: ANTICONVULSANTS    Family History  Problem Relation Age of Onset  . Dementia Mother   . Heart disease Father   . Parkinson's disease Sister   . Diabetes Sister   . Pancreatic cancer Sister   . Leukemia Sister    mother deceased  Age 46 or 21 from  Alzheimer's dementia.mother also with a history of thyroid problems. Father deceased age 8 from an acute MI.  Prior to Admission medications   Medication Sig Start Date End Date Taking? Authorizing Provider  calcium-vitamin D (OSCAL WITH D) 500-200 MG-UNIT per tablet Take 1 tablet by mouth daily with breakfast.   Yes Historical Provider, MD  clonazePAM (KLONOPIN) 0.5 MG tablet Take 0.5 mg by mouth 2 (two) times daily as needed.  For anxiety.    Yes Historical Provider, MD  cloNIDine (CATAPRES) 0.2 MG tablet Take 0.2 mg by mouth 2 (two) times daily. Only if b/p reading is greater than 180/110   Yes Historical Provider, MD  MAGNESIUM PO Take 1 tablet by mouth daily.   Yes Historical Provider, MD  Multiple Vitamin (MULTIVITAMIN WITH MINERALS) TABS tablet Take 1 tablet by mouth daily.   Yes Historical Provider, MD  omega-3 acid ethyl esters (LOVAZA) 1 G capsule Take 1 g by mouth 2 (two) times daily.   Yes Historical Provider, MD  Potassium Gluconate 550 MG TABS Take 1 tablet by mouth every morning.   Yes Historical Provider, MD  sodium chloride (OCEAN) 0.65 % SOLN nasal spray Place 1 spray into both nostrils as needed for congestion.   Yes Historical Provider, MD  cloNIDine (CATAPRES) 0.3 MG tablet Take 1 tablet (0.3 mg total) by mouth 2 (two) times daily. Take if BP is 180/110. If BP is still >180/110 after 2 hours, repeat dose. Patient not taking: Reported on 03/15/2016 02/18/16   [name], MD  levocetirizine (XYZAL) 5 MG tablet Take 5 mg by mouth every evening.    Historical Provider, MD  lisinopril (PRINIVIL,ZESTRIL) 20 MG tablet Take 1 tablet (20 mg total) by mouth daily. Patient not taking: Reported on 03/15/2016 02/18/16   [name], MD    Physical Exam: Vitals:   03/15/16 1800 03/15/16 1805 03/15/16 1835 03/15/16 1900  BP: (!) 185/109 176/100 (!) 185/118 135/92  Pulse: 77   74  Resp: 19 17  22   Temp:      TempSrc:      SpO2: 99% 97%  95%  Weight:          Constitutional: NAD, calm, comfortable Vitals:   03/15/16 1800 03/15/16 1805 03/15/16 1835 03/15/16 1900  BP: (!) 185/109 176/100 (!) 185/118 135/92  Pulse: 77   74  Resp: 19 17  22   Temp:      TempSrc:      SpO2: 99% 97%  95%  Weight:       Eyes: PERRLA. Large periorbital swelling in the right eye.  Eyes open. Small linear approximate 5 mm closed laceration at the corner of the lateral right brow.  Large subconjunctival hemorrhage in  the right eye. No hyphema. Extraocular movements intact. ENMT: Mucous membranes are moist. Posterior pharynx clear of any exudate or lesions.Normal dentition.  Neck: normal, supple, no masses, no thyromegaly Respiratory: clear to auscultation bilaterally, no wheezing, no crackles. Normal respiratory effort. No accessory muscle use.  Cardiovascular: Regular rate and rhythm, no murmurs / rubs / gallops. No extremity edema. 2+ pedal pulses. No carotid bruits.  Abdomen: no tenderness, no masses palpated. No hepatosplenomegaly. Bowel sounds positive.  Musculoskeletal: no clubbing / cyanosis. No joint deformity upper and lower extremities. Good ROM, no contractures. Normal muscle tone.  Skin: no rashes, lesions, ulcers. No induration Neurologic: CN 2-12 grossly intact. Sensation intact, DTR normal. Strength 5/5 in all 4.  Psychiatric: Normal judgment and insight. Alert and oriented x 3.  Normal mood.  Labs on Admission: I have personally reviewed following labs and imaging studies  CBC:  Recent Labs Lab 03/15/16 1549  WBC 11.0*  HGB 15.0  HCT 45.4  MCV 83.2  PLT 123456   Basic Metabolic Panel:  Recent Labs Lab 03/15/16 1549  NA 137  K 3.5  CL 103  CO2 24  GLUCOSE 96  BUN 14  CREATININE 1.07  CALCIUM 9.0   GFR: CrCl cannot be calculated (Unknown ideal weight.). Liver Function Tests: No results for input(s): AST, ALT, ALKPHOS, BILITOT, PROT, ALBUMIN in the last 168 hours. No results for input(s): LIPASE, AMYLASE in the last 168 hours. No results for input(s): AMMONIA in the last 168 hours. Coagulation Profile:  Recent Labs Lab 03/15/16 1549  INR 1.15   Cardiac Enzymes:  Recent Labs Lab 03/15/16 1549  TROPONINI <0.03   BNP (last 3 results) No results for input(s): PROBNP in the last 8760 hours. HbA1C: No results for input(s): HGBA1C in the last 72 hours. CBG: No results for input(s): GLUCAP in the last 168 hours. Lipid Profile: No results for input(s): CHOL, HDL,  LDLCALC, TRIG, CHOLHDL, LDLDIRECT in the last 72 hours. Thyroid Function Tests: No results for input(s): TSH, T4TOTAL, FREET4, T3FREE, THYROIDAB in the last 72 hours. Anemia Panel: No results for input(s): VITAMINB12, FOLATE, FERRITIN, TIBC, IRON, RETICCTPCT in the last 72 hours. Urine analysis:    Component Value Date/Time   COLORURINE YELLOW 02/17/2016 2331   APPEARANCEUR CLEAR 02/17/2016 2331   LABSPEC 1.012 02/17/2016 2331   PHURINE 7.5 02/17/2016 2331   GLUCOSEU NEGATIVE 02/17/2016 2331   HGBUR TRACE (A) 02/17/2016 2331   BILIRUBINUR NEGATIVE 02/17/2016 2331   KETONESUR NEGATIVE 02/17/2016 2331   PROTEINUR 30 (A) 02/17/2016 2331   UROBILINOGEN 0.2 01/01/2015 0415   NITRITE NEGATIVE 02/17/2016 2331   LEUKOCYTESUR NEGATIVE 02/17/2016 2331   Sepsis Labs: !!!!!!!!!!!!!!!!!!!!!!!!!!!!!!!!!!!!!!!!!!!! @LABRCNTIP (procalcitonin:4,lacticidven:4) )No results found for this or any previous visit (from the past 240 hour(s)).   Radiological Exams on Admission: Ct Head Wo Contrast  Result Date: 03/15/2016 CLINICAL DATA:  Fall.  Right orbital swelling EXAM: CT HEAD AND ORBITS WITHOUT CONTRAST TECHNIQUE: Contiguous axial images were obtained from the base of the skull through the vertex without contrast. Multidetector CT imaging of the orbits was performed using the standard protocol without intravenous contrast. COMPARISON:  CT head 02/17/2016 FINDINGS: CT HEAD FINDINGS Brain: Image quality degraded by motion. Allowing for this, no acute intracranial abnormality. Mild atrophy. No acute infarct, hemorrhage, or mass lesion. Vascular: No hyperdense vessel or unexpected calcification. Skull: Negative for skull fracture. Right facial fractures as described below. Sinuses/Orbits: Air-fluid level right maxillary sinus. Other: Soft tissue swelling lateral to the right orbit. CT ORBITS FINDINGS Multiple fractures involving the right face and orbit. Nondisplaced fracture of the right orbital roof.  Nondisplaced fracture right orbital floor. Slightly displaced fracture right lateral orbit. Mildly displaced fracture right zygomatic arch. Mildly displaced fracture right anterior and lateral wall of the maxillary sinus. The entire maxillary sinus was not included on the study. Air-fluid level right maxillary sinus compatible with blood. No fracture of the pterygoid plates. No fracture in the left face. Mandible incompletely imaged on this study. Soft tissue swelling anterior and lateral to the right orbit. No edema within the orbit. IMPRESSION: No acute intracranial abnormality.  CT head degraded by motion Multiple fractures in the right face and orbit as described above. Air-fluid level right maxillary sinus compatible with blood. Electronically Signed   By: Franchot Gallo  M.D.   On: 03/15/2016 14:53   Ct Lavon Paganini W/o Cm  Result Date: 03/15/2016 CLINICAL DATA:  Fall.  Right orbital swelling EXAM: CT HEAD AND ORBITS WITHOUT CONTRAST TECHNIQUE: Contiguous axial images were obtained from the base of the skull through the vertex without contrast. Multidetector CT imaging of the orbits was performed using the standard protocol without intravenous contrast. COMPARISON:  CT head 02/17/2016 FINDINGS: CT HEAD FINDINGS Brain: Image quality degraded by motion. Allowing for this, no acute intracranial abnormality. Mild atrophy. No acute infarct, hemorrhage, or mass lesion. Vascular: No hyperdense vessel or unexpected calcification. Skull: Negative for skull fracture. Right facial fractures as described below. Sinuses/Orbits: Air-fluid level right maxillary sinus. Other: Soft tissue swelling lateral to the right orbit. CT ORBITS FINDINGS Multiple fractures involving the right face and orbit. Nondisplaced fracture of the right orbital roof. Nondisplaced fracture right orbital floor. Slightly displaced fracture right lateral orbit. Mildly displaced fracture right zygomatic arch. Mildly displaced fracture right anterior and  lateral wall of the maxillary sinus. The entire maxillary sinus was not included on the study. Air-fluid level right maxillary sinus compatible with blood. No fracture of the pterygoid plates. No fracture in the left face. Mandible incompletely imaged on this study. Soft tissue swelling anterior and lateral to the right orbit. No edema within the orbit. IMPRESSION: No acute intracranial abnormality.  CT head degraded by motion Multiple fractures in the right face and orbit as described above. Air-fluid level right maxillary sinus compatible with blood. Electronically Signed   By: Franchot Gallo M.D.   On: 03/15/2016 14:53    EKG: Independently reviewed. Normal sinus rhythm with LVH.  Assessment/Plan Principal Problem:   Seizure ([place]) Active Problems:   Hypertensive urgency   OSA (obstructive sleep apnea)   Chronic allergic rhinitis   Chronic anxiety   Orbital fracture ([facility])   Facial fracture ([facility])   Leukocytosis   #1 seizures Patient noted to have a prior history of seizures noncompliant with medications. Per wife patient was found with face down in the table surrounded by a pool of blood noted to initially be lethargic when patient was assessed by his wife and also to have urinary incontinence.CT head donenegative for any intracranial abnormalities however did show facial and orbital fractures secondary to  Seizures. Patient was consulted by neurology who had recommended a Dilantin dose and then Dilantin 300 mg daily at bedtime.  Will check a UA with cultures and sensitivities. Check a chest x-ray. Patient afebrile. Doubt any infectious etiology at this time.Will check MRI  With and without contrast of the head to rule out focus for seizures, EEG. Place on the seizure precautions. Neurology following and appreciate input and recommendations.  #2 hypertensive urgency Patient noted to have systolic blood pressures as high as 208. Patient given some labetalol when better blood pressure  control.EKG consistent with LVH. Check a 2-D echo. Place back on home regimen of lisinopril 20 mg daily. Add Lopressor 25 mg twice a day. Follow.  #3 obstructive sleep apnea  #4 leukocytosis Likely secondary to seizures.heck a UA with cultures and sensitivities. Check a chest x-ray. No need for antibiotics at this time. Follow.  #5 facial fractures/orbital fracture Likely secondary to problem #1. The ED physician Dr. Benjamine Mola of ENT will assess the patient in-house.pain management.  #6 chronic allergic rhinitis Resume home regimen of Xyzal   DVT prophylaxis: SCDs Code Status: full Family Communication: updated patient and wife at bedside. Disposition Plan: home once seizure workup is completed and improvement  with blood pressure. Consults called: neurology: Dr. Cristobal Goldmann 03/15/2016/PCCM Dr. Lake Bells 03/15/2016/ENT/: Dr Benjamine Mola to see per ED physician Admission status: admit to inpatient  To step down unit.   [facility] MD [facility] Pager [phone]  If 7PM-7AM, please contact night-coverage [email or web address] Password [facility]  03/15/2016, 7:19 PM

## 2016-03-15 NOTE — Consult Note (Signed)
PULMONARY / CRITICAL CARE MEDICINE   Name: Noah Rose  MRN: [id number] DOB: 03/21/1951    ADMISSION DATE:  03/15/2016 CONSULTATION DATE:  03/15/2016  REFERRING MD:  Dr. Vallery Ridge  CHIEF COMPLAINT:  Fall, seizure, elevated blood pressure  HISTORY OF PRESENT ILLNESS:   This is a 65 year old male with a past medical history significant for seizure disorder in refractory hypertension followed for a number of years by the hypertension clinic at [facility] as well as a long-standing history of noncompliance with medications presented to the [facility] emergency department on 03/15/2016 after a fall and seizure at home. His wife states that he's had a lengthy past history of seizures for many years which has been evaluated at [place], [place], and [facility]. Today she heard him fall and witnessed a seizure. He was brought to the emergency department and en route to CT scan was noted to have a seizure witnessed by nursing. Here he was noted to have a blood pressure of 180/110.  He is conversant now to some degree and following commands.  His wife tells me that at home his blood pressure frequently runs higher than 180/110 and it has been this way for many years. She states that he does not take his blood pressure medications.  In the emergency department today he received a 20 mg dose of lisinopril, 0.2 mg of clonidine, and 10 mg of labetalol.   PAST MEDICAL HISTORY :  He  has a past medical history of Anxiety; Dizziness; High blood pressure; Hypertension; Multiple allergies; and Seizures ([place]).  PAST SURGICAL HISTORY: He  has no past surgical history on file.  Allergies  Allergen Reactions  . Almond Oil Shortness Of Breath  . Corn Oil Shortness Of Breath  . Dust Mite Mixed Allergen Ext [Mite (D. Farinae)] Shortness Of Breath  . Salicylates Shortness Of Breath and Other (See Comments)  . Amlodipine     dizziness  . Amphetamines     dizziness  . Aspirin     dizziness  . Dextroamphetamine    dizziness  . Felodipine     dizziness  . Indapamide     dizziness  . Iodinated Diagnostic Agents Other (See Comments)    Uncoded Allergy. Allergen: Preservatives, Other Reaction: HTN CRISIS Uncoded Allergy. Allergen: ANTICONVULSANTS    No current facility-administered medications on file prior to encounter.    Current Outpatient Prescriptions on File Prior to Encounter  Medication Sig  . calcium-vitamin D (OSCAL WITH D) 500-200 MG-UNIT per tablet Take 1 tablet by mouth daily with breakfast.  . clonazePAM (KLONOPIN) 0.5 MG tablet Take 0.5 mg by mouth 2 (two) times daily as needed. For anxiety.   Marland Kitchen MAGNESIUM PO Take 1 tablet by mouth daily.  . Multiple Vitamin (MULTIVITAMIN WITH MINERALS) TABS tablet Take 1 tablet by mouth daily.  Marland Kitchen omega-3 acid ethyl esters (LOVAZA) 1 G capsule Take 1 g by mouth 2 (two) times daily.  . Potassium Gluconate 550 MG TABS Take 1 tablet by mouth every morning.  . sodium chloride (OCEAN) 0.65 % SOLN nasal spray Place 1 spray into both nostrils as needed for congestion.  . cloNIDine (CATAPRES) 0.3 MG tablet Take 1 tablet (0.3 mg total) by mouth 2 (two) times daily. Take if BP is 180/110. If BP is still >180/110 after 2 hours, repeat dose. (Patient not taking: Reported on 03/15/2016)  . levocetirizine (XYZAL) 5 MG tablet Take 5 mg by mouth every evening.  Marland Kitchen lisinopril (PRINIVIL,ZESTRIL) 20 MG tablet Take 1 tablet (  20 mg total) by mouth daily. (Patient not taking: Reported on 03/15/2016)    FAMILY HISTORY:  His indicated that his mother is deceased. He indicated that his father is deceased. He indicated that two of his four sisters are alive.    SOCIAL HISTORY: He  reports that he has never smoked. He has never used smokeless tobacco. He reports that he does not drink alcohol or use drugs.  REVIEW OF SYSTEMS:   Cannot obtain due to somnolence, see history of present illness  SUBJECTIVE:  As above  VITAL SIGNS: BP (!) 176/103   Pulse 78   Temp 98.3 F  (36.8 C) (Oral)   Resp 17   Wt 83.9 kg (185 lb)   SpO2 97%   BMI 27.32 kg/m   HEMODYNAMICS:    VENTILATOR SETTINGS:    INTAKE / OUTPUT: No intake/output data recorded.  PHYSICAL EXAMINATION: General:  Comfortable in bed Neuro:  Speaks in few word sentences, follows commands, strength 5 out of 5 bilaterally in all 4 extremities HEENT:  Laceration over right orbit, bruising over right orbit Cardiovascular:  Regular rate and rhythm, no murmurs gallops or rubs Lungs:  Clear to auscultation bilaterally with normal effort Abdomen:  Bowel sounds positive, nontender nondistended Musculoskeletal:  Normal bulk and tone Skin:  No rash or skin breakdown  LABS:  BMET  Recent Labs Lab 03/15/16 1549  NA 137  K 3.5  CL 103  CO2 24  BUN 14  CREATININE 1.07  GLUCOSE 96    Electrolytes  Recent Labs Lab 03/15/16 1549  CALCIUM 9.0    CBC  Recent Labs Lab 03/15/16 1549  WBC 11.0*  HGB 15.0  HCT 45.4  PLT 211    Coag's  Recent Labs Lab 03/15/16 1549  INR 1.15    Sepsis Markers No results for input(s): LATICACIDVEN, PROCALCITON, O2SATVEN in the last 168 hours.  ABG No results for input(s): PHART, PCO2ART, PO2ART in the last 168 hours.  Liver Enzymes No results for input(s): AST, ALT, ALKPHOS, BILITOT, ALBUMIN in the last 168 hours.  Cardiac Enzymes  Recent Labs Lab 03/15/16 1549  TROPONINI <0.03    Glucose No results for input(s): GLUCAP in the last 168 hours.  Imaging Ct Head Wo Contrast  Result Date: 03/15/2016 CLINICAL DATA:  Fall.  Right orbital swelling EXAM: CT HEAD AND ORBITS WITHOUT CONTRAST TECHNIQUE: Contiguous axial images were obtained from the base of the skull through the vertex without contrast. Multidetector CT imaging of the orbits was performed using the standard protocol without intravenous contrast. COMPARISON:  CT head 02/17/2016 FINDINGS: CT HEAD FINDINGS Brain: Image quality degraded by motion. Allowing for this, no acute  intracranial abnormality. Mild atrophy. No acute infarct, hemorrhage, or mass lesion. Vascular: No hyperdense vessel or unexpected calcification. Skull: Negative for skull fracture. Right facial fractures as described below. Sinuses/Orbits: Air-fluid level right maxillary sinus. Other: Soft tissue swelling lateral to the right orbit. CT ORBITS FINDINGS Multiple fractures involving the right face and orbit. Nondisplaced fracture of the right orbital roof. Nondisplaced fracture right orbital floor. Slightly displaced fracture right lateral orbit. Mildly displaced fracture right zygomatic arch. Mildly displaced fracture right anterior and lateral wall of the maxillary sinus. The entire maxillary sinus was not included on the study. Air-fluid level right maxillary sinus compatible with blood. No fracture of the pterygoid plates. No fracture in the left face. Mandible incompletely imaged on this study. Soft tissue swelling anterior and lateral to the right orbit. No edema within the orbit.  IMPRESSION: No acute intracranial abnormality.  CT head degraded by motion Multiple fractures in the right face and orbit as described above. Air-fluid level right maxillary sinus compatible with blood. Electronically Signed   By: Franchot Gallo M.D.   On: 03/15/2016 14:53   Ct Orbitss W/o Cm  Result Date: 03/15/2016 CLINICAL DATA:  Fall.  Right orbital swelling EXAM: CT HEAD AND ORBITS WITHOUT CONTRAST TECHNIQUE: Contiguous axial images were obtained from the base of the skull through the vertex without contrast. Multidetector CT imaging of the orbits was performed using the standard protocol without intravenous contrast. COMPARISON:  CT head 02/17/2016 FINDINGS: CT HEAD FINDINGS Brain: Image quality degraded by motion. Allowing for this, no acute intracranial abnormality. Mild atrophy. No acute infarct, hemorrhage, or mass lesion. Vascular: No hyperdense vessel or unexpected calcification. Skull: Negative for skull fracture.  Right facial fractures as described below. Sinuses/Orbits: Air-fluid level right maxillary sinus. Other: Soft tissue swelling lateral to the right orbit. CT ORBITS FINDINGS Multiple fractures involving the right face and orbit. Nondisplaced fracture of the right orbital roof. Nondisplaced fracture right orbital floor. Slightly displaced fracture right lateral orbit. Mildly displaced fracture right zygomatic arch. Mildly displaced fracture right anterior and lateral wall of the maxillary sinus. The entire maxillary sinus was not included on the study. Air-fluid level right maxillary sinus compatible with blood. No fracture of the pterygoid plates. No fracture in the left face. Mandible incompletely imaged on this study. Soft tissue swelling anterior and lateral to the right orbit. No edema within the orbit. IMPRESSION: No acute intracranial abnormality.  CT head degraded by motion Multiple fractures in the right face and orbit as described above. Air-fluid level right maxillary sinus compatible with blood. Electronically Signed   By: Franchot Gallo M.D.   On: 03/15/2016 14:53     STUDIES:  03/15/2016 CT head without evidence of cerebral injury but there are multiple facial fractures   DISCUSSION: 65 year old male with a past medical history significant for refractory hypertension, medication noncompliance and a poorly understood seizure history presented to the [facility] emergency department with seizures 2 and a fall with facial fractures. Pulmonary and critical care medicine was consulted for consideration of admission to the intensive care unit and infusion of the nicardipine infusion.  The patient has a long history of hypertension and his wife tells [place] today in the emergency department that his blood pressure is frequently above 180/110 on a daily basis. In fact, it sounds as if his current blood pressure is actually at baseline. On my review of prior emergency room visits over the last several  years for seizure or dizziness he has been noted to have very similar blood pressure readings prior to his discharge home. (see ER notes from 09/2013, 12/2014, 01/2016)  So at this time I think it's reasonable to admit him to the hospital for titration of oral medications but I don't think it's necessary to admit him to the intensive care unit for an infusion of nicardipine.  Seizure care per neurology  Trauma care per trauma  Pulmonary critical care medicine will be available on an as-needed basis.  Roselie Awkward, MD [facility] Pager: [phone] Cell: [phone] After 3pm or if no response, call [phone]

## 2016-03-15 NOTE — Consult Note (Signed)
NEURO HOSPITALIST CONSULT NOTE   Requestig physician: Dr. Johnney Killian   Reason for Consult: Seizure?   History obtained from:  Patient  And family  HPI:                                                                                                                                          BOHDEN MAUPIN is an 65 y.o. male with hx as below presents with hypertensive urgency, fall orbital fx and possible seizure like activity. Patient has hx of complex partial seizure but is non compliant with his medication as per his wife.  Past Medical History:  Diagnosis Date  . Anxiety   . Dizziness   . High blood pressure   . Hypertension   . Multiple allergies   . Seizures ([place])     History reviewed. No pertinent surgical history.  Family History  Problem Relation Age of Onset  . Dementia Mother   . Heart disease Father   . Parkinson's disease Sister   . Diabetes Sister   . Pancreatic cancer Sister   . Leukemia Sister       Social History:  reports that he has never smoked. He has never used smokeless tobacco. He reports that he does not drink alcohol or use drugs.  Allergies  Allergen Reactions  . Almond Oil Shortness Of Breath  . Corn Oil Shortness Of Breath  . Dust Mite Mixed Allergen Ext [Mite (D. Farinae)] Shortness Of Breath  . Salicylates Shortness Of Breath and Other (See Comments)  . Amlodipine     dizziness  . Amphetamines     dizziness  . Aspirin     dizziness  . Dextroamphetamine     dizziness  . Felodipine     dizziness  . Indapamide     dizziness  . Iodinated Diagnostic Agents Other (See Comments)    Uncoded Allergy. Allergen: Preservatives, Other Reaction: HTN CRISIS Uncoded Allergy. Allergen: ANTICONVULSANTS    MEDICATIONS:                                                                                                                     I have reviewed the patient's current medications.   ROS:  History obtained from chart review and the patient  General ROS: negative for - chills, fatigue, fever, night sweats, weight gain or weight loss Psychological ROS: negative for - behavioral disorder, hallucinations, memory difficulties, mood swings or suicidal ideation Ophthalmic ROS: negative for - blurry vision, double vision, eye pain or loss of vision ENT ROS: negative for - epistaxis, nasal discharge, oral lesions, sore throat, tinnitus or vertigo Allergy and Immunology ROS: negative for - hives or itchy/watery eyes Hematological and Lymphatic ROS: negative for - bleeding problems, bruising or swollen lymph nodes Endocrine ROS: negative for - galactorrhea, hair pattern changes, polydipsia/polyuria or temperature intolerance Respiratory ROS: negative for - cough, hemoptysis, shortness of breath or wheezing Cardiovascular ROS: negative for - chest pain, dyspnea on exertion, edema or irregular heartbeat Gastrointestinal ROS: negative for - abdominal pain, diarrhea, hematemesis, nausea/vomiting or stool incontinence Genito-Urinary ROS: negative for - dysuria, hematuria, incontinence or urinary frequency/urgency Musculoskeletal ROS: negative for - joint swelling or muscular weakness Neurological ROS: as noted in HPI Dermatological ROS: negative for rash and skin lesion changes   Blood pressure (!) 185/124, pulse 85, temperature 98.3 F (36.8 C), temperature source Oral, resp. rate 17, SpO2 100 %.   Neurologic Examination:                                                                                                      HEENT-  Normocephalic, no lesions, without obvious abnormality.  Normal external eye and conjunctiva.  Normal TM's bilaterally.  Normal auditory canals and external ears. Normal external nose, mucus membranes and septum.  Normal pharynx. Cardiovascular- regular rate and  rhythm, S1, S2 normal, no murmur, click, rub or gallop, pulses palpable throughout   Lungs- chest clear, no wheezing, rales, normal symmetric air entry, Heart exam - S1, S2 normal, no murmur, no gallop, rate regular Abdomen- soft, non-tender; bowel sounds normal; no masses,  no organomegaly   Neurological Examination Mental Status: Alert, oriented, thought content appropriate.  Speech fluent without evidence of aphasia.  Able to follow 3 step commands without difficulty. Cranial Nerves: II:  Visual fields grossly normal, pupils equal, round, reactive to light and accommodation III,IV, VI: ptosis not present, extra-ocular motions intact bilaterally V,VII: smile symmetric, facial light touch sensation normal bilaterally VIII: hearing normal bilaterally IX,X: uvula rises symmetrically XI: bilateral shoulder shrug XII: midline tongue extension Motor: Right : Upper extremity   5/5    Left:     Upper extremity   5/5  Lower extremity   5/5     Lower extremity   5/5 Tone and bulk:normal tone throughout; no atrophy noted Sensory: Pinprick and light touch intact throughout, bilaterally Cerebellar: normal finger-to-nose,      Lab Results: Basic Metabolic Panel: No results for input(s): NA, K, CL, CO2, GLUCOSE, BUN, CREATININE, CALCIUM, MG, PHOS in the last 168 hours.  Liver Function Tests: No results for input(s): AST, ALT, ALKPHOS, BILITOT, PROT, ALBUMIN in the last 168 hours. No results for input(s): LIPASE, AMYLASE in the last 168 hours. No results for input(s): AMMONIA in the last 168 hours.  CBC:  No results for input(s): WBC, NEUTROABS, HGB, HCT, MCV, PLT in the last 168 hours.  Cardiac Enzymes: No results for input(s): CKTOTAL, CKMB, CKMBINDEX, TROPONINI in the last 168 hours.  Lipid Panel: No results for input(s): CHOL, TRIG, HDL, CHOLHDL, VLDL, LDLCALC in the last 168 hours.  CBG: No results for input(s): GLUCAP in the last 168 hours.  Microbiology: Results for orders  placed or performed during the hospital encounter of 03/21/10  Urine culture     Status: None   Collection Time: 03/21/10  8:50 PM  Result Value Ref Range Status   Specimen Description URINE, RANDOM  Final   Special Requests Hypertension  Final   Culture  Setup Time [id number]  Final   Colony Count NO GROWTH  Final   Culture NO GROWTH  Final   Report Status 03/23/2010 FINAL  Final    Coagulation Studies: No results for input(s): LABPROT, INR in the last 72 hours.  Imaging: Ct Head Wo Contrast  Result Date: 03/15/2016 CLINICAL DATA:  Fall.  Right orbital swelling EXAM: CT HEAD AND ORBITS WITHOUT CONTRAST TECHNIQUE: Contiguous axial images were obtained from the base of the skull through the vertex without contrast. Multidetector CT imaging of the orbits was performed using the standard protocol without intravenous contrast. COMPARISON:  CT head 02/17/2016 FINDINGS: CT HEAD FINDINGS Brain: Image quality degraded by motion. Allowing for this, no acute intracranial abnormality. Mild atrophy. No acute infarct, hemorrhage, or mass lesion. Vascular: No hyperdense vessel or unexpected calcification. Skull: Negative for skull fracture. Right facial fractures as described below. Sinuses/Orbits: Air-fluid level right maxillary sinus. Other: Soft tissue swelling lateral to the right orbit. CT ORBITS FINDINGS Multiple fractures involving the right face and orbit. Nondisplaced fracture of the right orbital roof. Nondisplaced fracture right orbital floor. Slightly displaced fracture right lateral orbit. Mildly displaced fracture right zygomatic arch. Mildly displaced fracture right anterior and lateral wall of the maxillary sinus. The entire maxillary sinus was not included on the study. Air-fluid level right maxillary sinus compatible with blood. No fracture of the pterygoid plates. No fracture in the left face. Mandible incompletely imaged on this study. Soft tissue swelling anterior and lateral to the right  orbit. No edema within the orbit. IMPRESSION: No acute intracranial abnormality.  CT head degraded by motion Multiple fractures in the right face and orbit as described above. Air-fluid level right maxillary sinus compatible with blood. Electronically Signed   By: Franchot Gallo M.D.   On: 03/15/2016 14:53   Ct Orbitss W/o Cm  Result Date: 03/15/2016 CLINICAL DATA:  Fall.  Right orbital swelling EXAM: CT HEAD AND ORBITS WITHOUT CONTRAST TECHNIQUE: Contiguous axial images were obtained from the base of the skull through the vertex without contrast. Multidetector CT imaging of the orbits was performed using the standard protocol without intravenous contrast. COMPARISON:  CT head 02/17/2016 FINDINGS: CT HEAD FINDINGS Brain: Image quality degraded by motion. Allowing for this, no acute intracranial abnormality. Mild atrophy. No acute infarct, hemorrhage, or mass lesion. Vascular: No hyperdense vessel or unexpected calcification. Skull: Negative for skull fracture. Right facial fractures as described below. Sinuses/Orbits: Air-fluid level right maxillary sinus. Other: Soft tissue swelling lateral to the right orbit. CT ORBITS FINDINGS Multiple fractures involving the right face and orbit. Nondisplaced fracture of the right orbital roof. Nondisplaced fracture right orbital floor. Slightly displaced fracture right lateral orbit. Mildly displaced fracture right zygomatic arch. Mildly displaced fracture right anterior and lateral wall of the maxillary sinus. The entire maxillary sinus was not  included on the study. Air-fluid level right maxillary sinus compatible with blood. No fracture of the pterygoid plates. No fracture in the left face. Mandible incompletely imaged on this study. Soft tissue swelling anterior and lateral to the right orbit. No edema within the orbit. IMPRESSION: No acute intracranial abnormality.  CT head degraded by motion Multiple fractures in the right face and orbit as described above. Air-fluid  level right maxillary sinus compatible with blood. Electronically Signed   By: Franchot Gallo M.D.   On: 03/15/2016 14:53       Assessment/Plan:  65 y.o. male with hx as below presents with hypertensive urgency, fall orbital fx and possible seizure like activity. Patient has hx of complex partial seizure but is non compliant with his medication as per his wife.  Recommend-  - MRI brain with and without contrast to look for seizure focus, PRES - Would recommend infectious workup as this can lower seizure threshold - EEG - Would recommend to load with dilantin 15mg /kg then continue 300mg  Qhs and get a level in 24hrs

## 2016-03-15 NOTE — ED Notes (Signed)
Neuro MD at bedside

## 2016-03-15 NOTE — ED Provider Notes (Signed)
[name] DEPT Provider Note   CSN: [id number] Arrival date & time: 03/15/16  1054     History   Chief Complaint Chief Complaint  Patient presents with  . Fall    HPI Noah Rose is a 65 y.o. male.  HPI Patient had an unwitnessed event while sitting at his computer. His wife was in the house and heard him fall. She states he does have a history of seizures and suspects that he may have had a seizure. She however did not witness seizure activity. On him seated in the chair and slumped at the desk with a small pool of blood under his face. She is unsure if he actually fell to the ground or if he had remained seated and hit his face on the desk. The patient denies knowing exactly what happened. (The patient's wife reports that he generally denies that he has a seizure disorder even though it is diagnosed and also he is very noncompliant with his medications for hypertension. The patient explained to me the many reasons why he does not take his medications and is due to adverse reactions and all is feeling worse and he does without them.) Patient is denying generalized headache. He denies chest pain or shortness of breath. He denies focal weakness numbness or tingling of the extremities. Past Medical History:  Diagnosis Date  . Anxiety   . Dizziness   . High blood pressure   . Hypertension   . Multiple allergies   . Seizures [facility])     Patient Active Problem List   Diagnosis Date Noted  . Syncope 07/28/2012  . Hypertension 07/28/2012  . OSA (obstructive sleep apnea) 07/28/2012  . Chronic allergic rhinitis 07/28/2012  . Chronic anxiety 07/28/2012  . 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 No pertinent surgical history.     Home Medications    Prior to Admission medications   Medication Sig Start Date End Date Taking? Authorizing Provider  calcium-vitamin D (OSCAL WITH D) 500-200 MG-UNIT per tablet Take 1 tablet by mouth daily with breakfast.   Yes Historical  Provider, MD  clonazePAM (KLONOPIN) 0.5 MG tablet Take 0.5 mg by mouth 2 (two) times daily as needed. For anxiety.    Yes Historical Provider, MD  cloNIDine (CATAPRES) 0.2 MG tablet Take 0.2 mg by mouth 2 (two) times daily. Only if b/p reading is greater than 180/110   Yes Historical Provider, MD  MAGNESIUM PO Take 1 tablet by mouth daily.   Yes Historical Provider, MD  Multiple Vitamin (MULTIVITAMIN WITH MINERALS) TABS tablet Take 1 tablet by mouth daily.   Yes Historical Provider, MD  omega-3 acid ethyl esters (LOVAZA) 1 G capsule Take 1 g by mouth 2 (two) times daily.   Yes Historical Provider, MD  Potassium Gluconate 550 MG TABS Take 1 tablet by mouth every morning.   Yes Historical Provider, MD  sodium chloride (OCEAN) 0.65 % SOLN nasal spray Place 1 spray into both nostrils as needed for congestion.   Yes Historical Provider, MD  cloNIDine (CATAPRES) 0.3 MG tablet Take 1 tablet (0.3 mg total) by mouth 2 (two) times daily. Take if BP is 180/110. If BP is still >180/110 after 2 hours, repeat dose. Patient not taking: Reported on 03/15/2016 02/18/16   [name], MD  levocetirizine (XYZAL) 5 MG tablet Take 5 mg by mouth every evening.    Historical Provider, MD  lisinopril (PRINIVIL,ZESTRIL) 20 MG tablet Take 1 tablet (20 mg total) by mouth daily. Patient not  taking: Reported on 03/15/2016 02/18/16   [name], MD    Family History Family History  Problem Relation Age of Onset  . Dementia Mother   . Heart disease Father   . Parkinson's disease Sister   . Diabetes Sister   . Pancreatic cancer Sister   . Leukemia Sister     Social History Social History  Substance Use Topics  . Smoking status: Never Smoker  . Smokeless tobacco: Never Used  . Alcohol use No     Allergies   Almond oil; Corn oil; Dust mite mixed allergen ext [mite (d. farinae)]; Salicylates; Amlodipine; Amphetamines; Aspirin; Dextroamphetamine; Felodipine; Indapamide; and Iodinated diagnostic  agents   Review of Systems Review of Systems 10 Systems reviewed and are negative for acute change except as noted in the HPI.   Physical Exam Updated Vital Signs BP (!) 176/103   Pulse 78   Temp 98.3 F (36.8 C) (Oral)   Resp 17   SpO2 97%   Physical Exam  Constitutional: He appears well-developed and well-nourished.  Patient has obvious facial injury but is awake and alert. No respiratory distress.  HENT:  Large periorbital swelling on the right eye. Eyelid is open however. Very small linear approximately 5 mm closed laceration on the corner of the lateral right brow. No active bleeding. Bilateral TMs normal. Oral cavity clear. Posterior or first widely patent. No dental injury. She does not have apparent tongue laceration.  Eyes: EOM are normal. Pupils are equal, round, and reactive to light.  Patient has large subconjunctival hemorrhage on the right eye. No hyphema. Normal extraocular motions without entrapment.  Neck: Neck supple.  No C-spine tenderness to palpation.  Cardiovascular: Normal rate, regular rhythm, normal heart sounds and intact distal pulses.   Pulmonary/Chest: Effort normal and breath sounds normal.  Abdominal: Soft. He exhibits no distension. There is no tenderness. There is no guarding.  Musculoskeletal: Normal range of motion. He exhibits no edema, tenderness or deformity.  Neurological: He is alert. No cranial nerve deficit. He exhibits normal muscle tone. Coordination normal.  Patient can do finger-nose examination bilaterally. Patient has intact cognition but does seem to have somewhat wandering thought process. He follows all commands appropriately. No pronator drift. Upper and lower extremity strength testing is 5\5.  Skin: Skin is warm and dry.     ED Treatments / Results  Labs (all labs ordered are listed, but only abnormal results are displayed) Labs Reviewed  CBC - Abnormal; Notable for the following:       Result Value   WBC 11.0 (*)    All  other components within normal limits  BASIC METABOLIC PANEL  PROTIME-INR  TROPONIN I  BASIC METABOLIC PANEL  TROPONIN I  CBC WITH DIFFERENTIAL/PLATELET  PROTIME-INR    EKG  EKG Interpretation None       Radiology Ct Head Wo Contrast  Result Date: 03/15/2016 CLINICAL DATA:  Fall.  Right orbital swelling EXAM: CT HEAD AND ORBITS WITHOUT CONTRAST TECHNIQUE: Contiguous axial images were obtained from the base of the skull through the vertex without contrast. Multidetector CT imaging of the orbits was performed using the standard protocol without intravenous contrast. COMPARISON:  CT head 02/17/2016 FINDINGS: CT HEAD FINDINGS Brain: Image quality degraded by motion. Allowing for this, no acute intracranial abnormality. Mild atrophy. No acute infarct, hemorrhage, or mass lesion. Vascular: No hyperdense vessel or unexpected calcification. Skull: Negative for skull fracture. Right facial fractures as described below. Sinuses/Orbits: Air-fluid level right maxillary sinus. Other: Soft  tissue swelling lateral to the right orbit. CT ORBITS FINDINGS Multiple fractures involving the right face and orbit. Nondisplaced fracture of the right orbital roof. Nondisplaced fracture right orbital floor. Slightly displaced fracture right lateral orbit. Mildly displaced fracture right zygomatic arch. Mildly displaced fracture right anterior and lateral wall of the maxillary sinus. The entire maxillary sinus was not included on the study. Air-fluid level right maxillary sinus compatible with blood. No fracture of the pterygoid plates. No fracture in the left face. Mandible incompletely imaged on this study. Soft tissue swelling anterior and lateral to the right orbit. No edema within the orbit. IMPRESSION: No acute intracranial abnormality.  CT head degraded by motion Multiple fractures in the right face and orbit as described above. Air-fluid level right maxillary sinus compatible with blood. Electronically Signed   By:  Franchot Gallo M.D.   On: 03/15/2016 14:53   Ct Orbitss W/o Cm  Result Date: 03/15/2016 CLINICAL DATA:  Fall.  Right orbital swelling EXAM: CT HEAD AND ORBITS WITHOUT CONTRAST TECHNIQUE: Contiguous axial images were obtained from the base of the skull through the vertex without contrast. Multidetector CT imaging of the orbits was performed using the standard protocol without intravenous contrast. COMPARISON:  CT head 02/17/2016 FINDINGS: CT HEAD FINDINGS Brain: Image quality degraded by motion. Allowing for this, no acute intracranial abnormality. Mild atrophy. No acute infarct, hemorrhage, or mass lesion. Vascular: No hyperdense vessel or unexpected calcification. Skull: Negative for skull fracture. Right facial fractures as described below. Sinuses/Orbits: Air-fluid level right maxillary sinus. Other: Soft tissue swelling lateral to the right orbit. CT ORBITS FINDINGS Multiple fractures involving the right face and orbit. Nondisplaced fracture of the right orbital roof. Nondisplaced fracture right orbital floor. Slightly displaced fracture right lateral orbit. Mildly displaced fracture right zygomatic arch. Mildly displaced fracture right anterior and lateral wall of the maxillary sinus. The entire maxillary sinus was not included on the study. Air-fluid level right maxillary sinus compatible with blood. No fracture of the pterygoid plates. No fracture in the left face. Mandible incompletely imaged on this study. Soft tissue swelling anterior and lateral to the right orbit. No edema within the orbit. IMPRESSION: No acute intracranial abnormality.  CT head degraded by motion Multiple fractures in the right face and orbit as described above. Air-fluid level right maxillary sinus compatible with blood. Electronically Signed   By: Franchot Gallo M.D.   On: 03/15/2016 14:53    Procedures Procedures (including critical care time) CRITICAL CARE Performed by: Charlesetta Shanks   Total critical care time:60  minutes  Critical care time was exclusive of separately billable procedures and treating other patients.  Critical care was necessary to treat or prevent imminent or life-threatening deterioration.  Critical care was time spent personally by me on the following activities: development of treatment plan with patient and/or surrogate as well as nursing, discussions with consultants, evaluation of patient's response to treatment, examination of patient, obtaining history from patient or surrogate, ordering and performing treatments and interventions, ordering and review of laboratory studies, ordering and review of radiographic studies, pulse oximetry and re-evaluation of patient's condition.  Medications Ordered in ED Medications  lisinopril (PRINIVIL,ZESTRIL) tablet 20 mg (0 mg Oral Hold 03/15/16 1431)  cloNIDine (CATAPRES) tablet 0.2 mg (0 mg Oral Hold 03/15/16 1432)  nicardipine (CARDENE) 20mg  in 0.86% saline 247ml IV infusion (0.1 mg/ml) (not administered)  LORazepam (ATIVAN) injection 1 mg (1 mg Intravenous Given 03/15/16 1303)  LORazepam (ATIVAN) injection 1 mg (1 mg Intravenous Given 03/15/16 1311)  labetalol (NORMODYNE,TRANDATE) injection 10 mg (10 mg Intravenous Given 03/15/16 1553)     Initial Impression / Assessment and Plan / ED Course  I have reviewed the triage vital signs and the nursing notes.  Pertinent labs & imaging results that were available during my care of the patient were reviewed by me and considered in my medical decision making (see chart for details).  Clinical Course  On route to CT, patient had a witnessed seizure by nursing staff. By the time he was back to the room this had resolved and the patient had intact mental status. He had at that time and given 1 mg of Ativan. I did not witness the seizure myself as it was already resolved. Upon recheck he did not have localizing neurologic deficit but was somewhat more somnolent. At that time I did however order an  additional 1 mg of Ativan for seizure prophylaxis to go to CT scan.  Consult: Patient's case was reviewed with neurology. We did review the patient's known seizure history with 2 probable seizures today, 1 being in the emergency department. Consult: Patient's case was reviewed with Dr. Benjamine Mola of ENT for facial and orbital fractures. He reports he will see the patient in the hospital.  Neurology Dr.Schikhman advises to initiate Cardene drip and load with Dilantin for seizure prophylaxis. Landed mid to ICU.  Consult: Dr. Emmit Alexanders intensivist. Will have patient evaluated emergency department for admission. At this time we will initiate Cardene as advised by neurology. Final Clinical Impressions(s) / ED Diagnoses   Final diagnoses:  Orbital fracture, closed, initial encounter ([place])  Seizure ([place])  Hypertensive urgency   Patient is noncompliant with seizure medications and antihypertensives. He had an episode this afternoon that is suspected to been precipitated by a seizure. The patient's wife heard him all while he had been working on a computer in the bedroom. She found him in a seated position and slumped at the desk. She is unsure if he might have gone to the ground and then got back up and sat in the chair or feet struck his head on the desk in front of him. On arrival of patient is alert and appropriate but has speech that is slightly wandering in subject. Patient has no neurologic deficits. At baseline the patient has been experiencing cognitive decline. The patient's wife reports that he denies that he has seizures even though it has been conclusively diagnosed. She also reports he is very noncompliant with antihypertensives. The patient will go and a long dissertations on why he does not take these medications. He had does not identify any intracranial bleeding. Patient had a seizure en route to CT. This was not witnessed by myself, it had resolved. He was treated with Ativan 1 mg 2 doses. After  the Ativan, he was more sedated than he had been at baseline and will sleep but awakens appropriately to stimulus. He does have orbital fractures from his fall. Ocular examination shows all extraocular motions intact and no hyphema. Der.Benjamine Mola has been consulted and will examine the patient for his orbital fracture. At baseline, patient is noncompliant with antihypertensives reviewed he does present significantly hypertensive. He did have improvement in blood pressure without medication administration but then began to spike again. Examination does not show focal neurologic deficit. Upon review with neurology the plan will be to treat for hypertensive urgency/emergency with Cardene drip. Patient will be admitted to intensivist service. New Prescriptions New Prescriptions   No medications on file     [facility]  Johnney Killian, MD 03/15/16 1659

## 2016-03-15 NOTE — ED Notes (Addendum)
Patient transported to CT by this RN. Pt was lethargic with garbled speech and eye opening upon touch to CT. Immediately after CT upon transfer to stretcher pt became alert and was looking around room. Pt oriented to self and time but still disoriented to situation and location. Upon arrival to ED room patient asleep, snoring. Pt repositioned- airway intact. Family at bedside.    Dr. Colvin Caroli made aware of BP and holding PO meds due to lethargy.

## 2016-03-15 NOTE — Progress Notes (Signed)
MEDICATION RELATED CONSULT NOTE - INITIAL   Pharmacy Consult to dose phenytoin loading dose  Indication: seizure   Allergies  Allergen Reactions  . Almond Oil Shortness Of Breath  . Corn Oil Shortness Of Breath  . Dust Mite Mixed Allergen Ext [Mite (D. Farinae)] Shortness Of Breath  . Salicylates Shortness Of Breath and Other (See Comments)  . Amlodipine     dizziness  . Amphetamines     dizziness  . Aspirin     dizziness  . Dextroamphetamine     dizziness  . Felodipine     dizziness  . Indapamide     dizziness  . Iodinated Diagnostic Agents Other (See Comments)    Uncoded Allergy. Allergen: Preservatives, Other Reaction: HTN CRISIS Uncoded Allergy. Allergen: ANTICONVULSANTS    Patient Measurements: Weight: 185 lb (83.9 kg)  Vital Signs: Temp: 98.3 F (36.8 C) (09/19 1104) Temp Source: Oral (09/19 1104) BP: 176/103 (09/19 1600) Pulse Rate: 78 (09/19 1600)  Labs:  Recent Labs  03/15/16 1549  WBC 11.0*  HGB 15.0  HCT 45.4  PLT 211  CREATININE 1.07    Medical History: Past Medical History:  Diagnosis Date  . Anxiety   . Dizziness   . High blood pressure   . Hypertension   . Multiple allergies   . Seizures [facility])    Assessment: 65 yo male admitted with hypertension, fall, and possible seizure. Pharmacy consulted to dose phenytoin loading dose. Patient with history of complex partial seizures. Per neurology, recommendation for phenytoin 15 mg/kg load, then 300 mg qhs with level drawn in 24 hrs.   Plan:  Phenytoin 15 mg/kg IV once  Pharmacy recommends if considering maintenance dose of phenytoin 300 mg qhs per neurology, then order as extended release formulation  Argie Ramming, PharmD Pharmacy Resident  Pager [phone] 03/15/16 5:03 PM

## 2016-03-16 ENCOUNTER — Inpatient Hospital Stay (HOSPITAL_COMMUNITY): Payer: Medicare Other

## 2016-03-16 ENCOUNTER — Inpatient Hospital Stay (HOSPITAL_BASED_OUTPATIENT_CLINIC_OR_DEPARTMENT_OTHER): Payer: Medicare Other

## 2016-03-16 DIAGNOSIS — S02402D Zygomatic fracture, unspecified, subsequent encounter for fracture with routine healing: Secondary | ICD-10-CM

## 2016-03-16 DIAGNOSIS — I160 Hypertensive urgency: Secondary | ICD-10-CM

## 2016-03-16 DIAGNOSIS — J0100 Acute maxillary sinusitis, unspecified: Secondary | ICD-10-CM | POA: Diagnosis present

## 2016-03-16 DIAGNOSIS — G40209 Localization-related (focal) (partial) symptomatic epilepsy and epileptic syndromes with complex partial seizures, not intractable, without status epilepticus: Secondary | ICD-10-CM | POA: Diagnosis not present

## 2016-03-16 DIAGNOSIS — J309 Allergic rhinitis, unspecified: Secondary | ICD-10-CM | POA: Diagnosis not present

## 2016-03-16 DIAGNOSIS — R569 Unspecified convulsions: Secondary | ICD-10-CM

## 2016-03-16 DIAGNOSIS — S0280XA Fracture of other specified skull and facial bones, unspecified side, initial encounter for closed fracture: Secondary | ICD-10-CM | POA: Diagnosis not present

## 2016-03-16 LAB — COMPREHENSIVE METABOLIC PANEL
ALK PHOS: 72 U/L (ref 38–126)
ALT: 30 U/L (ref 17–63)
ANION GAP: 4 — AB (ref 5–15)
AST: 29 U/L (ref 15–41)
Albumin: 3.8 g/dL (ref 3.5–5.0)
BILIRUBIN TOTAL: 1 mg/dL (ref 0.3–1.2)
BUN: 10 mg/dL (ref 6–20)
CALCIUM: 8.8 mg/dL — AB (ref 8.9–10.3)
CO2: 28 mmol/L (ref 22–32)
CREATININE: 0.95 mg/dL (ref 0.61–1.24)
Chloride: 105 mmol/L (ref 101–111)
Glucose, Bld: 111 mg/dL — ABNORMAL HIGH (ref 65–99)
Potassium: 3.3 mmol/L — ABNORMAL LOW (ref 3.5–5.1)
Sodium: 137 mmol/L (ref 135–145)
TOTAL PROTEIN: 6.4 g/dL — AB (ref 6.5–8.1)

## 2016-03-16 LAB — CBC
HCT: 45 % (ref 39.0–52.0)
HEMOGLOBIN: 14.6 g/dL (ref 13.0–17.0)
MCH: 27.2 pg (ref 26.0–34.0)
MCHC: 32.4 g/dL (ref 30.0–36.0)
MCV: 83.8 fL (ref 78.0–100.0)
PLATELETS: 192 10*3/uL (ref 150–400)
RBC: 5.37 MIL/uL (ref 4.22–5.81)
RDW: 14.2 % (ref 11.5–15.5)
WBC: 8.1 10*3/uL (ref 4.0–10.5)

## 2016-03-16 LAB — MAGNESIUM: Magnesium: 2.1 mg/dL (ref 1.7–2.4)

## 2016-03-16 LAB — ECHOCARDIOGRAM COMPLETE: Weight: 2832 oz

## 2016-03-16 MED ORDER — GADOBENATE DIMEGLUMINE 529 MG/ML IV SOLN
20.0000 mL | Freq: Once | INTRAVENOUS | Status: AC
  Administered 2016-03-16: 20 mL via INTRAVENOUS

## 2016-03-16 MED ORDER — AMOXICILLIN-POT CLAVULANATE 875-125 MG PO TABS
1.0000 | ORAL_TABLET | Freq: Two times a day (BID) | ORAL | Status: DC
  Administered 2016-03-16 – 2016-03-17 (×3): 1 via ORAL
  Filled 2016-03-16 (×3): qty 1

## 2016-03-16 MED ORDER — TAMSULOSIN HCL 0.4 MG PO CAPS
0.4000 mg | ORAL_CAPSULE | Freq: Every day | ORAL | Status: DC
  Administered 2016-03-17: 0.4 mg via ORAL
  Filled 2016-03-16 (×2): qty 1

## 2016-03-16 MED ORDER — POTASSIUM CHLORIDE CRYS ER 20 MEQ PO TBCR
40.0000 meq | EXTENDED_RELEASE_TABLET | Freq: Once | ORAL | Status: AC
  Administered 2016-03-16: 20 meq via ORAL
  Filled 2016-03-16: qty 2

## 2016-03-16 NOTE — Evaluation (Signed)
Physical Therapy Evaluation Patient Details Name: Noah Rose MRN: [id number] DOB: 1951/03/07 Today's Date: 03/16/2016   History of Present Illness  Noah Rose is a 65 y.o. male with medical history significant of hypertension, medication noncompliance, allergies, seizure disorder, obstructive sleep apnea not on a sleep apnea machine due to intolerance presented to the ED with probable seizure episode. Right tripod fractures, R eye injury  Clinical Impression  Patient demonstrates deficits in functional mobility as indicated below. Will need continued skilled PT to address deficits and maixmize function. Will see as indicated and progress as tolerated.     Follow Up Recommendations Home health PT;Supervision for mobility/OOB    Equipment Recommendations  None recommended by PT    Recommendations for Other Services       Precautions / Restrictions Precautions Precautions: Fall Restrictions Weight Bearing Restrictions: No      Mobility  Bed Mobility Overal bed mobility: Needs Assistance Bed Mobility: Supine to Sit     Supine to sit: Supervision     General bed mobility comments: pt initiating standing before instructed by therapy, impulsive  Transfers Overall transfer level: Needs assistance Equipment used: None Transfers: Sit to/from Stand Sit to Stand: Min guard         General transfer comment: cues for safety to slow pace of speed  Ambulation/Gait Ambulation/Gait assistance: Min guard;Min assist Ambulation Distance (Feet): 160 Feet Assistive device: None Gait Pattern/deviations: Step-through pattern;Decreased stride length;Staggering right;Narrow base of support Gait velocity: decreased   General Gait Details: patient with multiple LOB during ambulation, educated on increased cadence.   Stairs            Wheelchair Mobility    Modified Rankin (Stroke Patients Only)       Balance Overall balance assessment: Needs assistance   Sitting  balance-Leahy Scale: Good     Standing balance support: During functional activity Standing balance-Leahy Scale: Poor Standing balance comment: decreased dynamic standing balance             High level balance activites: Side stepping;Backward walking;Direction changes;Turns;Sudden stops High Level Balance Comments: Min guard to min assist for higher level balance tasks, able to walk backwards with increased pace, some instability to the right with horizontal and verticle head truns             Pertinent Vitals/Pain Pain Assessment: No/denies pain    Home Living Family/patient expects to be discharged to:: Private residence Living Arrangements: Spouse/significant other   Type of Home: House Home Access: Stairs to enter Entrance Stairs-Rails: Right;Left;Can reach both Entrance Stairs-Number of Steps: 4 Home Layout: Two level;Able to live on main level with bedroom/bathroom;Bed/bath upstairs Home Equipment: None      Prior Function Level of Independence: Independent               Hand Dominance   Dominant Hand: Right    Extremity/Trunk Assessment   Upper Extremity Assessment: Overall WFL for tasks assessed           Lower Extremity Assessment: Overall WFL for tasks assessed         Communication   Communication: No difficulties  Cognition Arousal/Alertness: Awake/alert Behavior During Therapy: Impulsive Overall Cognitive Status: Impaired/Different from baseline Area of Impairment: Safety/judgement;Awareness         Safety/Judgement: Decreased awareness of safety Awareness: Emergent   General Comments: patient very easily distracted by environmenment, max cues to direct to task    General Comments General comments (skin integrity, edema, etc.): bruising and swelling  over right eye and face    Exercises     Assessment/Plan    PT Assessment Patient needs continued PT services  PT Problem List Decreased activity tolerance;Decreased  balance;Decreased mobility;Decreased safety awareness          PT Treatment Interventions DME instruction;Gait training;Stair training;Functional mobility training;Therapeutic activities;Therapeutic exercise;Balance training;Patient/family education    PT Goals (Current goals can be found in the Care Plan section)  Acute Rehab PT Goals Patient Stated Goal: to go home PT Goal Formulation: With patient/family Time For Goal Achievement: 03/30/16 Potential to Achieve Goals: Good    Frequency Min 3X/week   Barriers to discharge        Co-evaluation               End of Session Equipment Utilized During Treatment: Gait belt Activity Tolerance: Patient tolerated treatment well Patient left: in chair;with call bell/phone within reach;with chair alarm set;with family/visitor present Nurse Communication: Mobility status    Functional Assessment Tool Used: clinical judgement Functional Limitation: Mobility: Walking and moving around Mobility: Walking and Moving Around Current Status [phone]): At least 1 percent but less than 20 percent impaired, limited or restricted Mobility: Walking and Moving Around Goal Status [phone]): 0 percent impaired, limited or restricted    Time: 1352-1417 PT Time Calculation (min) (ACUTE ONLY): 25 min   Charges:   PT Evaluation $PT Eval Moderate Complexity: 1 Procedure     PT G Codes:   PT G-Codes **NOT FOR INPATIENT CLASS** Functional Assessment Tool Used: clinical judgement Functional Limitation: Mobility: Walking and moving around Mobility: Walking and Moving Around Current Status [phone]): At least 1 percent but less than 20 percent impaired, limited or restricted Mobility: Walking and Moving Around Goal Status [phone]): 0 percent impaired, limited or restricted    Duncan Dull 03/16/2016, 6:17 PM  Alben Deeds, [place] DPT  [phone]

## 2016-03-16 NOTE — Progress Notes (Signed)
OT Cancellation Note  Patient Details Name: Noah Rose MRN: [id number] DOB: 02/07/1951   Cancelled Treatment:    Reason Eval/Treat Not Completed: Patient at procedure or test/ unavailable. Will re attempt later today as able/as appropriate  [name] 03/16/2016, 10:38 AM

## 2016-03-16 NOTE — Progress Notes (Signed)
EEG Completed; Results Pending  

## 2016-03-16 NOTE — Consult Note (Signed)
Reason for Consult: Facial fractures  HPI:  CAGE Noah Rose is an 65 y.o. male who was admitted to [facility] yesterday s/p a fall. There was a question of possible seizure prior to his fall. His CT scan shows a minimally displaced right tripod fractures. Pt denies any significant visual change.    Past Medical History:  Diagnosis Date  . Anxiety   . Dizziness   . High blood pressure   . Hypertension   . Multiple allergies   . Seizures ([place])     History reviewed. No pertinent surgical history.  Family History  Problem Relation Age of Onset  . Dementia Mother   . Heart disease Father   . Parkinson's disease Sister   . Diabetes Sister   . Pancreatic cancer Sister   . Leukemia Sister     Social History:  reports that he has never smoked. He has never used smokeless tobacco. He reports that he does not drink alcohol or use drugs.  Allergies:  Allergies  Allergen Reactions  . Almond Oil Shortness Of Breath  . Corn Oil Shortness Of Breath  . Dust Mite Mixed Allergen Ext [Mite (D. Farinae)] Shortness Of Breath  . Salicylates Shortness Of Breath and Other (See Comments)  . Amlodipine     dizziness  . Amphetamines     dizziness  . Aspirin     dizziness  . Dextroamphetamine     dizziness  . Felodipine     dizziness  . Indapamide     dizziness  . Iodinated Diagnostic Agents Other (See Comments)    Uncoded Allergy. Allergen: Preservatives, Other Reaction: HTN CRISIS Uncoded Allergy. Allergen: ANTICONVULSANTS    Prior to Admission medications   Medication Sig Start Date End Date Taking? Authorizing Provider  calcium-vitamin D (OSCAL WITH D) 500-200 MG-UNIT per tablet Take 1 tablet by mouth daily with breakfast.   Yes Historical Provider, MD  clonazePAM (KLONOPIN) 0.5 MG tablet Take 0.5 mg by mouth 2 (two) times daily as needed. For anxiety.    Yes Historical Provider, MD  cloNIDine (CATAPRES) 0.2 MG tablet Take 0.2 mg by mouth 2 (two) times daily. Only if b/p  reading is greater than 180/110   Yes Historical Provider, MD  MAGNESIUM PO Take 1 tablet by mouth daily.   Yes Historical Provider, MD  Multiple Vitamin (MULTIVITAMIN WITH MINERALS) TABS tablet Take 1 tablet by mouth daily.   Yes Historical Provider, MD  omega-3 acid ethyl esters (LOVAZA) 1 G capsule Take 1 g by mouth 2 (two) times daily.   Yes Historical Provider, MD  Potassium Gluconate 550 MG TABS Take 1 tablet by mouth every morning.   Yes Historical Provider, MD  sodium chloride (OCEAN) 0.65 % SOLN nasal spray Place 1 spray into both nostrils as needed for congestion.   Yes Historical Provider, MD  cloNIDine (CATAPRES) 0.3 MG tablet Take 1 tablet (0.3 mg total) by mouth 2 (two) times daily. Take if BP is 180/110. If BP is still >180/110 after 2 hours, repeat dose. Patient not taking: Reported on 03/15/2016 02/18/16   [name], MD  levocetirizine (XYZAL) 5 MG tablet Take 5 mg by mouth every evening.    Historical Provider, MD  lisinopril (PRINIVIL,ZESTRIL) 20 MG tablet Take 1 tablet (20 mg total) by mouth daily. Patient not taking: Reported on 03/15/2016 02/18/16   [name], MD    Medications:  I have reviewed the patient's current medications. Scheduled: . calcium-vitamin D  1 tablet Oral  Q breakfast  . cloNIDine  0.2 mg Oral Once  . lisinopril  20 mg Oral Daily  . loratadine  10 mg Oral Daily  . magnesium oxide  200 mg Oral Daily  . multivitamin with minerals  1 tablet Oral Daily  . omega-3 acid ethyl esters  1 g Oral BID  . phenytoin  300 mg Oral QHS  . sodium chloride flush  3 mL Intravenous Q12H   XLK:[id number] **OR** acetaminophen, clonazePAM, hydrALAZINE, LORazepam, magnesium citrate, ondansetron **OR** ondansetron (ZOFRAN) IV, senna-docusate, sodium chloride, sorbitol, traMADol  Results for orders placed or performed during the hospital encounter of 03/15/16 (from the past 48 hour(s))  Basic metabolic panel     Status: None   Collection Time: 03/15/16   3:49 PM  Result Value Ref Range   Sodium 137 135 - 145 mmol/L   Potassium 3.5 3.5 - 5.1 mmol/L   Chloride 103 101 - 111 mmol/L   CO2 24 22 - 32 mmol/L   Glucose, Bld 96 65 - 99 mg/dL   BUN 14 6 - 20 mg/dL   Creatinine, Ser 1.07 0.61 - 1.24 mg/dL   Calcium 9.0 8.9 - 10.3 mg/dL   GFR calc non Af Amer >60 >60 mL/min   GFR calc Af Amer >60 >60 mL/min    Comment: (NOTE) The eGFR has been calculated using the CKD EPI equation. This calculation has not been validated in all clinical situations. eGFR's persistently <60 mL/min signify possible Chronic Kidney Disease.    Anion gap 10 5 - 15  CBC     Status: Abnormal   Collection Time: 03/15/16  3:49 PM  Result Value Ref Range   WBC 11.0 (H) 4.0 - 10.5 K/uL   RBC 5.46 4.22 - 5.81 MIL/uL   Hemoglobin 15.0 13.0 - 17.0 g/dL   HCT 45.4 39.0 - 52.0 %   MCV 83.2 78.0 - 100.0 fL   MCH 27.5 26.0 - 34.0 pg   MCHC 33.0 30.0 - 36.0 g/dL   RDW 13.9 11.5 - 15.5 %   Platelets 211 150 - 400 K/uL  Protime-INR     Status: None   Collection Time: 03/15/16  3:49 PM  Result Value Ref Range   Prothrombin Time 14.8 11.4 - 15.2 seconds   INR 1.15   Troponin I     Status: None   Collection Time: 03/15/16  3:49 PM  Result Value Ref Range   Troponin I <0.03 <0.03 ng/mL  Urinalysis, Routine w reflex microscopic (not at [facility])     Status: None   Collection Time: 03/15/16  8:30 PM  Result Value Ref Range   Color, Urine YELLOW YELLOW   APPearance CLEAR CLEAR   Specific Gravity, Urine 1.006 1.005 - 1.030   pH 7.5 5.0 - 8.0   Glucose, UA NEGATIVE NEGATIVE mg/dL   Hgb urine dipstick NEGATIVE NEGATIVE   Bilirubin Urine NEGATIVE NEGATIVE   Ketones, ur NEGATIVE NEGATIVE mg/dL   Protein, ur NEGATIVE NEGATIVE mg/dL   Nitrite NEGATIVE NEGATIVE   Leukocytes, UA NEGATIVE NEGATIVE    Comment: MICROSCOPIC NOT DONE ON URINES WITH NEGATIVE PROTEIN, BLOOD, LEUKOCYTES, NITRITE, OR GLUCOSE <1000 mg/dL.  MRSA PCR Screening     Status: None   Collection Time: 03/15/16   9:30 PM  Result Value Ref Range   MRSA by PCR NEGATIVE NEGATIVE    Comment:        The GeneXpert MRSA Assay (FDA approved for NASAL specimens only), is one component of a comprehensive MRSA  colonization surveillance program. It is not intended to diagnose MRSA infection nor to guide or monitor treatment for MRSA infections.   Magnesium     Status: None   Collection Time: 03/16/16 12:10 AM  Result Value Ref Range   Magnesium 2.1 1.7 - 2.4 mg/dL  Comprehensive metabolic panel     Status: Abnormal   Collection Time: 03/16/16  5:26 AM  Result Value Ref Range   Sodium 137 135 - 145 mmol/L   Potassium 3.3 (L) 3.5 - 5.1 mmol/L   Chloride 105 101 - 111 mmol/L   CO2 28 22 - 32 mmol/L   Glucose, Bld 111 (H) 65 - 99 mg/dL   BUN 10 6 - 20 mg/dL   Creatinine, Ser 0.95 0.61 - 1.24 mg/dL   Calcium 8.8 (L) 8.9 - 10.3 mg/dL   Total Protein 6.4 (L) 6.5 - 8.1 g/dL   Albumin 3.8 3.5 - 5.0 g/dL   AST 29 15 - 41 U/L   ALT 30 17 - 63 U/L   Alkaline Phosphatase 72 38 - 126 U/L   Total Bilirubin 1.0 0.3 - 1.2 mg/dL   GFR calc non Af Amer >60 >60 mL/min   GFR calc Af Amer >60 >60 mL/min    Comment: (NOTE) The eGFR has been calculated using the CKD EPI equation. This calculation has not been validated in all clinical situations. eGFR's persistently <60 mL/min signify possible Chronic Kidney Disease.    Anion gap 4 (L) 5 - 15  CBC     Status: None   Collection Time: 03/16/16  5:26 AM  Result Value Ref Range   WBC 8.1 4.0 - 10.5 K/uL   RBC 5.37 4.22 - 5.81 MIL/uL   Hemoglobin 14.6 13.0 - 17.0 g/dL   HCT 45.0 39.0 - 52.0 %   MCV 83.8 78.0 - 100.0 fL   MCH 27.2 26.0 - 34.0 pg   MCHC 32.4 30.0 - 36.0 g/dL   RDW 14.2 11.5 - 15.5 %   Platelets 192 150 - 400 K/uL    Ct Head Wo Contrast  Result Date: 03/15/2016 CLINICAL DATA:  Fall.  Right orbital swelling EXAM: CT HEAD AND ORBITS WITHOUT CONTRAST TECHNIQUE: Contiguous axial images were obtained from the base of the skull through the vertex  without contrast. Multidetector CT imaging of the orbits was performed using the standard protocol without intravenous contrast. COMPARISON:  CT head 02/17/2016 FINDINGS: CT HEAD FINDINGS Brain: Image quality degraded by motion. Allowing for this, no acute intracranial abnormality. Mild atrophy. No acute infarct, hemorrhage, or mass lesion. Vascular: No hyperdense vessel or unexpected calcification. Skull: Negative for skull fracture. Right facial fractures as described below. Sinuses/Orbits: Air-fluid level right maxillary sinus. Other: Soft tissue swelling lateral to the right orbit. CT ORBITS FINDINGS Multiple fractures involving the right face and orbit. Nondisplaced fracture of the right orbital roof. Nondisplaced fracture right orbital floor. Slightly displaced fracture right lateral orbit. Mildly displaced fracture right zygomatic arch. Mildly displaced fracture right anterior and lateral wall of the maxillary sinus. The entire maxillary sinus was not included on the study. Air-fluid level right maxillary sinus compatible with blood. No fracture of the pterygoid plates. No fracture in the left face. Mandible incompletely imaged on this study. Soft tissue swelling anterior and lateral to the right orbit. No edema within the orbit. IMPRESSION: No acute intracranial abnormality.  CT head degraded by motion Multiple fractures in the right face and orbit as described above. Air-fluid level right maxillary sinus compatible with blood.  Electronically Signed   By: Franchot Gallo M.D.   On: 03/15/2016 14:53   Mr Jeri Cos EQ Contrast  Result Date: 03/16/2016 CLINICAL DATA:  Initial evaluation for acute seizure. History of seizures. EXAM: MRI HEAD WITHOUT AND WITH CONTRAST TECHNIQUE: Multiplanar, multiecho pulse sequences of the brain and surrounding structures were obtained without and with intravenous contrast. CONTRAST:  74m MULTIHANCE GADOBENATE DIMEGLUMINE 529 MG/ML IV SOLN COMPARISON:  Prior CT from  03/15/2016. Comparison also made with prior MRI from 11/10/2006. FINDINGS: Brain: Study mildly degraded by motion artifact. Cerebral volume within normal limits for patient age. Mild T2/FLAIR hyperintensity within the periventricular and deep white matter both cerebral hemispheres present, mild for age, and most likely related to chronic microvascular ischemic changes. No abnormal foci of restricted diffusion to suggest acute or subacute ischemia. Gray-white matter differentiation maintained. No evidence for acute or chronic intracranial hemorrhage. No areas of chronic infarction identified. Thin section imaging through the hippocampi demonstrates a normal appearance with normal morphology and signal intensity bilaterally. No mass lesion midline shift, or mass effect. No hydrocephalus. No extra-axial fluid collection. Major dural sinuses are grossly patent. No abnormal enhancement. Vascular: Major intracranial vascular flow voids are well maintained. Skull and upper cervical spine: Craniocervical junction normal. Partially visualized upper cervical spine unremarkable without significant degenerative changes are stenosis. Bone marrow signal intensity within normal limits. No scalp soft tissue abnormality. Sinuses/Orbits: Globes and orbits within normal limits. Scattered mucosal thickening within the ethmoidal air cells, frontal sinuses, and sphenoid sinuses. Mucosal thickening within the max O sinuses. Fluid level present within the right maxillary sinus. No mastoid effusion. Inner ear structures grossly unremarkable. Other: No other significant finding. IMPRESSION: 1. Normal brain MRI for patient age. No acute intracranial abnormality identified. 2. Moderate paranasal sinus disease as above. Air-fluid level within the right maxillary sinus suggestive of acute sinusitis. Electronically Signed   By: BJeannine BogaM.D.   On: 03/16/2016 04:47   Dg Chest Port 1 View  Result Date: 03/15/2016 CLINICAL DATA:   Acute onset of leukocytosis.  Initial encounter. EXAM: PORTABLE CHEST 1 VIEW COMPARISON:  Chest radiograph performed 07/27/2012 FINDINGS: The lungs are mildly hypoexpanded. Mild bibasilar opacities may reflect atelectasis or possibly mild infection. Calcified granulomata are noted at the left lung. No pleural effusion or pneumothorax is seen. The cardiomediastinal silhouette is within normal limits. No acute osseous abnormalities are seen. IMPRESSION: Lungs mildly hypoexpanded. Mild bibasilar opacities may reflect atelectasis or possibly mild infection, depending on the patient's symptoms. Electronically Signed   By: JGarald BaldingM.D.   On: 03/15/2016 20:14   Ct Orbitss W/o Cm  Result Date: 03/15/2016 CLINICAL DATA:  Fall.  Right orbital swelling EXAM: CT HEAD AND ORBITS WITHOUT CONTRAST TECHNIQUE: Contiguous axial images were obtained from the base of the skull through the vertex without contrast. Multidetector CT imaging of the orbits was performed using the standard protocol without intravenous contrast. COMPARISON:  CT head 02/17/2016 FINDINGS: CT HEAD FINDINGS Brain: Image quality degraded by motion. Allowing for this, no acute intracranial abnormality. Mild atrophy. No acute infarct, hemorrhage, or mass lesion. Vascular: No hyperdense vessel or unexpected calcification. Skull: Negative for skull fracture. Right facial fractures as described below. Sinuses/Orbits: Air-fluid level right maxillary sinus. Other: Soft tissue swelling lateral to the right orbit. CT ORBITS FINDINGS Multiple fractures involving the right face and orbit. Nondisplaced fracture of the right orbital roof. Nondisplaced fracture right orbital floor. Slightly displaced fracture right lateral orbit. Mildly displaced fracture right zygomatic arch. Mildly displaced  fracture right anterior and lateral wall of the maxillary sinus. The entire maxillary sinus was not included on the study. Air-fluid level right maxillary sinus compatible with  blood. No fracture of the pterygoid plates. No fracture in the left face. Mandible incompletely imaged on this study. Soft tissue swelling anterior and lateral to the right orbit. No edema within the orbit. IMPRESSION: No acute intracranial abnormality.  CT head degraded by motion Multiple fractures in the right face and orbit as described above. Air-fluid level right maxillary sinus compatible with blood. Electronically Signed   By: Franchot Gallo M.D.   On: 03/15/2016 14:53   Review of System (from chart review): General ROS: negative for - chills, fatigue, fever, night sweats, weight gain or weight loss Psychological ROS: negative for - behavioral disorder, hallucinations, memory difficulties, mood swings or suicidal ideation Ophthalmic ROS: negative for - blurry vision, double vision, eye pain or loss of vision ENT ROS: negative for - epistaxis, nasal discharge, oral lesions, sore throat, tinnitus or vertigo Allergy and Immunology ROS: negative for - hives or itchy/watery eyes Hematological and Lymphatic ROS: negative for - bleeding problems, bruising or swollen lymph nodes Endocrine ROS: negative for - galactorrhea, hair pattern changes, polydipsia/polyuria or temperature intolerance Respiratory ROS: negative for - cough, hemoptysis, shortness of breath or wheezing Cardiovascular ROS: negative for - chest pain, dyspnea on exertion, edema or irregular heartbeat Gastrointestinal ROS: negative for - abdominal pain, diarrhea, hematemesis, nausea/vomiting or stool incontinence Genito-Urinary ROS: negative for - dysuria, hematuria, incontinence or urinary frequency/urgency Musculoskeletal ROS: negative for - joint swelling or muscular weakness Neurological ROS: as noted in HPI Dermatological ROS: negative for rash and skin lesion changes  Blood pressure (!) 146/97, pulse 87, temperature 98.9 F (37.2 C), temperature source Oral, resp. rate 15, weight 177 lb (80.3 kg), SpO2 96 %. Physical  Exam: General: Sleepy but responded appropriately Head: Right facial ecchymosis and edema.  Eyes: PERRL EOMI. No entrapment. Vision grossly intact.   Ears: Normal TM's bilaterally.  Normal auditory canals and external ears. Nose/Mouth: Normal external nose, mucus membranes and septum.  Normal pharynx. Cardiovascular- regular rate and rhythm. Lungs- chest clear, no wheezing, no distress. Abdomen- soft, non-tender.  Assessment/Plan: Minimally displaced right tripod fractures. No entrapment, diplopia, or significant trismus. Likely will not need surgical intervention. Pt may follow up with me as an outpatient in 1 week to reassess.  Leanda Padmore,SUI W 03/16/2016, 7:43 AM

## 2016-03-16 NOTE — Procedures (Signed)
ELECTROENCEPHALOGRAM REPORT  Date of Study: 03/16/2016  Patient's Name: Noah Rose MRN: [id number] Date of Birth: 07-13-1950  Referring Provider: Dr. Irine Seal  Clinical History: This is a 65 year old man with a history of seizures, admitted for fall, question of possible seizure prior to his fall.  Medications: clonazePAM (KLONOPIN) tablet 0.5 mgacetaminophen (TYLENOL) tablet 650 mg  phenytoin (DILANTIN) ER capsule 300 mg  amoxicillin-clavulanate (AUGMENTIN) 875-125 MG per tablet 1 tablet  calcium-vitamin D (OSCAL WITH D) 500-200 MG-UNIT per tablet 1 tablet  cloNIDine (CATAPRES) tablet 0.2 mg  hydrALAZINE (APRESOLINE) injection 10 mg  lisinopril (PRINIVIL,ZESTRIL) tablet 20 mg  loratadine (CLARITIN) tablet 10 mg  LORazepam (ATIVAN) injection 1 mg  magnesium citrate solution 1 Bottle  magnesium oxide (MAG-OX) tablet 200 mg  multivitamin with minerals tablet 1 tablet  traMADol (ULTRAM) tablet 50 mg   Technical Summary: A multichannel digital EEG recording measured by the international 10-20 system with electrodes applied with paste and impedances below 5000 ohms performed as portable with EKG monitoring in a predominantly drowsy and asleep patient.  Hyperventilation and photic stimulation were not performed.  The digital EEG was referentially recorded, reformatted, and digitally filtered in a variety of bipolar and referential montages for optimal display.   Description: The patient is predominantly drowsy and asleep during the recording.  During brief period of wakefulness, there is a symmetric, medium voltage 8 Hz posterior dominant rhythm that poorly attenuates with eye opening and eye closure. During drowsiness and sleep, there is an increase in theta slowing of the background.  Vertex waves and symmetric sleep spindles were seen. There is occasional independent focal 4-5 Hz theta slowing seen over the bilateral temporal region. Hyperventilation and photic stimulation were  not performed. There were frequent left frontotemporal epileptiform discharges, as well as occasional independent right mid-temporal epileptiform discharges. There were no electrographic seizures seen.    EKG lead was unremarkable.  Impression: This predominantly drowsy and asleep EEG is abnormal due to the presence of: 1. Occasional focal slowing over the bilateral temporal regions 2. Frequent left frontotemporal epileptiform discharges 3. Occasional right mid-temporal epileptiform discharges  Clinical Correlation of the above findings indicates focal cerebral dysfunction over the bilateral temporal regions suggestive of underlying structural or physiologic abnormality. There is a tendency for seizures to arise from the bilateral temporal regions. Clinical correlation is advised.   Ellouise Newer, M.D.

## 2016-03-16 NOTE — Progress Notes (Signed)
Patient refused flomax.  Stating that he has allergies to so many things

## 2016-03-16 NOTE — Progress Notes (Signed)
PROGRESS NOTE    Noah Rose  [id number] DOB: February 28, 1951 DOA: 03/15/2016 PCP: Elyn Peers, MD    Assessment & Plan:   Principal Problem:   Seizure ([place]) Active Problems:   Hypertensive urgency   OSA (obstructive sleep apnea)   Chronic allergic rhinitis   Chronic anxiety   Orbital fracture (HCC)   Facial fracture (HCC)   Leukocytosis   Sinusitis, acute, maxillary: Right   Seizures ([place])  #1 seizures Patient with known prior history of seizures. Patient presented with seizures which led to facial bone and orbital bone fractures. Patient currently seizure-free. MRI head negative. EEG predominantly drowsy and asleep EEG however occasional focal slowing over bilateral temporal regions, frequent left frontotemporal epileptiform discharges, occasional right mid temporal epileptiform discharges. Patient was loaded with Dilantin on admission per neurology recommendations and patient currently on Dilantin 300 mg daily at bedtime. Check a Dilantin level tomorrow morning. Neurology following.  #2 hypertensive urgency Secondary to medical noncompliance. Patient has been resumed back on home regimen of lisinopril 20 mg daily with significant improvement with blood pressure. Follow.  #3 obstructive sleep apnea  #4 right facial fractures/right orbital fracture Secondary to problem #1. Patient has been assessed by ENT, Dr.Teoh who is recommended outpatient follow-up in 1 week to reassess minimally displaced right tripod fractures with no entrapment, diplopia or significant trismus. The ENT.  #5 acute right maxillary sinusitis Per CT head. Patient also had some complaints of some facial pain on admission and a leukocytosis. Will place patient on Augmentin.  #6 leukocytosis Likely secondary to problem #5 versus problem #1. WBC trending down. Patient has been started on Augmentin. Follow.  #7 hypokalemia Replete.  #8 chronic allergic rhinitis Xyzal  #9 urinary frequency Concern  for urinary retention. Urinalysis negative for acute UTI. Hemoglobin A1c pending. Patient with 250 mL postvoid residual. Will place on Flomax.   DVT prophylaxis: SCDs Code Status: Full Family Communication: Updated patient and wife at bedside. Disposition Plan: Home once seizure workup is completed, no further seizures and recommendations of anti-seizure medications made per neurology.   Consultants:   Neurology: Dr. Cristobal Goldmann 03/15/2016  ENT: Dr. Benjamine Mola 03/16/2016  PCCM: Dr Lake Bells 03/15/2016  Procedures:   MRI head 03/16/2016  Chest x-ray 03/15/2016  2-D echo 03/16/2016  CT head CT orbits 03/15/2016  EEG 03/16/2016  Antimicrobials:   None   Subjective: Patient with no further syncopal episodes. No chest pain. No shortness of breath. Blood pressure improved.  Objective: Vitals:   03/16/16 0529 03/16/16 0737 03/16/16 1126 03/16/16 1135  BP: (!) 146/97   (!) 150/95  Pulse:  87 81   Resp: (!) 22 15 16    Temp:  98.9 F (37.2 C) 99 F (37.2 C)   TempSrc:  Oral Oral   SpO2:  96% 97%   Weight:        Intake/Output Summary (Last 24 hours) at 03/16/16 1918 Last data filed at 03/16/16 1851  Gross per 24 hour  Intake              660 ml  Output              400 ml  Net              260 ml   Filed Weights   03/15/16 1653 03/15/16 2125  Weight: 83.9 kg (185 lb) 80.3 kg (177 lb)    Examination:  General exam: Appears calm and comfortable  Respiratory system: Clear to auscultation. Respiratory effort normal. Cardiovascular system:  S1 & S2 heard, RRR. No JVD, murmurs, rubs, gallops or clicks. No pedal edema. Gastrointestinal system: Abdomen is nondistended, soft and nontender. No organomegaly or masses felt. Normal bowel sounds heard. Central nervous system: Alert and oriented. No focal neurological deficits. Extremities: Symmetric 5 x 5 power. Skin: No rashes, lesions or ulcers Psychiatry: Judgement and insight appear normal. Mood & affect appropriate.      Data Reviewed: I have personally reviewed following labs and imaging studies  CBC:  Recent Labs Lab 03/15/16 1549 03/16/16 0526  WBC 11.0* 8.1  HGB 15.0 14.6  HCT 45.4 45.0  MCV 83.2 83.8  PLT 211 [id number]   Basic Metabolic Panel:  Recent Labs Lab 03/15/16 1549 03/16/16 0010 03/16/16 0526  NA 137  --  137  K 3.5  --  3.3*  CL 103  --  105  CO2 24  --  28  GLUCOSE 96  --  111*  BUN 14  --  10  CREATININE 1.07  --  0.95  CALCIUM 9.0  --  8.8*  MG  --  2.1  --    GFR: CrCl cannot be calculated (Unknown ideal weight.). Liver Function Tests:  Recent Labs Lab 03/16/16 0526  AST 29  ALT 30  ALKPHOS 72  BILITOT 1.0  PROT 6.4*  ALBUMIN 3.8   No results for input(s): LIPASE, AMYLASE in the last 168 hours. No results for input(s): AMMONIA in the last 168 hours. Coagulation Profile:  Recent Labs Lab 03/15/16 1549  INR 1.15   Cardiac Enzymes:  Recent Labs Lab 03/15/16 1549  TROPONINI <0.03   BNP (last 3 results) No results for input(s): PROBNP in the last 8760 hours. HbA1C: No results for input(s): HGBA1C in the last 72 hours. CBG: No results for input(s): GLUCAP in the last 168 hours. Lipid Profile: No results for input(s): CHOL, HDL, LDLCALC, TRIG, CHOLHDL, LDLDIRECT in the last 72 hours. Thyroid Function Tests: No results for input(s): TSH, T4TOTAL, FREET4, T3FREE, THYROIDAB in the last 72 hours. Anemia Panel: No results for input(s): VITAMINB12, FOLATE, FERRITIN, TIBC, IRON, RETICCTPCT in the last 72 hours. Sepsis Labs: No results for input(s): PROCALCITON, LATICACIDVEN in the last 168 hours.  Recent Results (from the past 240 hour(s))  MRSA PCR Screening     Status: None   Collection Time: 03/15/16  9:30 PM  Result Value Ref Range Status   MRSA by PCR NEGATIVE NEGATIVE Final    Comment:        The GeneXpert MRSA Assay (FDA approved for NASAL specimens only), is one component of a comprehensive MRSA colonization surveillance program. It  is not intended to diagnose MRSA infection nor to guide or monitor treatment for MRSA infections.          Radiology Studies: Ct Head Wo Contrast  Result Date: 03/15/2016 CLINICAL DATA:  Fall.  Right orbital swelling EXAM: CT HEAD AND ORBITS WITHOUT CONTRAST TECHNIQUE: Contiguous axial images were obtained from the base of the skull through the vertex without contrast. Multidetector CT imaging of the orbits was performed using the standard protocol without intravenous contrast. COMPARISON:  CT head 02/17/2016 FINDINGS: CT HEAD FINDINGS Brain: Image quality degraded by motion. Allowing for this, no acute intracranial abnormality. Mild atrophy. No acute infarct, hemorrhage, or mass lesion. Vascular: No hyperdense vessel or unexpected calcification. Skull: Negative for skull fracture. Right facial fractures as described below. Sinuses/Orbits: Air-fluid level right maxillary sinus. Other: Soft tissue swelling lateral to the right orbit. CT ORBITS FINDINGS Multiple fractures  involving the right face and orbit. Nondisplaced fracture of the right orbital roof. Nondisplaced fracture right orbital floor. Slightly displaced fracture right lateral orbit. Mildly displaced fracture right zygomatic arch. Mildly displaced fracture right anterior and lateral wall of the maxillary sinus. The entire maxillary sinus was not included on the study. Air-fluid level right maxillary sinus compatible with blood. No fracture of the pterygoid plates. No fracture in the left face. Mandible incompletely imaged on this study. Soft tissue swelling anterior and lateral to the right orbit. No edema within the orbit. IMPRESSION: No acute intracranial abnormality.  CT head degraded by motion Multiple fractures in the right face and orbit as described above. Air-fluid level right maxillary sinus compatible with blood. Electronically Signed   By: Franchot Gallo M.D.   On: 03/15/2016 14:53   Mr Jeri Cos [id number] Contrast  Result Date:  03/16/2016 CLINICAL DATA:  Initial evaluation for acute seizure. History of seizures. EXAM: MRI HEAD WITHOUT AND WITH CONTRAST TECHNIQUE: Multiplanar, multiecho pulse sequences of the brain and surrounding structures were obtained without and with intravenous contrast. CONTRAST:  41mL MULTIHANCE GADOBENATE DIMEGLUMINE 529 MG/ML IV SOLN COMPARISON:  Prior CT from 03/15/2016. Comparison also made with prior MRI from 11/10/2006. FINDINGS: Brain: Study mildly degraded by motion artifact. Cerebral volume within normal limits for patient age. Mild T2/FLAIR hyperintensity within the periventricular and deep white matter both cerebral hemispheres present, mild for age, and most likely related to chronic microvascular ischemic changes. No abnormal foci of restricted diffusion to suggest acute or subacute ischemia. Gray-white matter differentiation maintained. No evidence for acute or chronic intracranial hemorrhage. No areas of chronic infarction identified. Thin section imaging through the hippocampi demonstrates a normal appearance with normal morphology and signal intensity bilaterally. No mass lesion midline shift, or mass effect. No hydrocephalus. No extra-axial fluid collection. Major dural sinuses are grossly patent. No abnormal enhancement. Vascular: Major intracranial vascular flow voids are well maintained. Skull and upper cervical spine: Craniocervical junction normal. Partially visualized upper cervical spine unremarkable without significant degenerative changes are stenosis. Bone marrow signal intensity within normal limits. No scalp soft tissue abnormality. Sinuses/Orbits: Globes and orbits within normal limits. Scattered mucosal thickening within the ethmoidal air cells, frontal sinuses, and sphenoid sinuses. Mucosal thickening within the max O sinuses. Fluid level present within the right maxillary sinus. No mastoid effusion. Inner ear structures grossly unremarkable. Other: No other significant finding.  IMPRESSION: 1. Normal brain MRI for patient age. No acute intracranial abnormality identified. 2. Moderate paranasal sinus disease as above. Air-fluid level within the right maxillary sinus suggestive of acute sinusitis. Electronically Signed   By: Jeannine Boga M.D.   On: 03/16/2016 04:47   Dg Chest Port 1 View  Result Date: 03/15/2016 CLINICAL DATA:  Acute onset of leukocytosis.  Initial encounter. EXAM: PORTABLE CHEST 1 VIEW COMPARISON:  Chest radiograph performed 07/27/2012 FINDINGS: The lungs are mildly hypoexpanded. Mild bibasilar opacities may reflect atelectasis or possibly mild infection. Calcified granulomata are noted at the left lung. No pleural effusion or pneumothorax is seen. The cardiomediastinal silhouette is within normal limits. No acute osseous abnormalities are seen. IMPRESSION: Lungs mildly hypoexpanded. Mild bibasilar opacities may reflect atelectasis or possibly mild infection, depending on the patient's symptoms. Electronically Signed   By: Garald Balding M.D.   On: 03/15/2016 20:14   Ct Orbitss W/o Cm  Result Date: 03/15/2016 CLINICAL DATA:  Fall.  Right orbital swelling EXAM: CT HEAD AND ORBITS WITHOUT CONTRAST TECHNIQUE: Contiguous axial images were obtained from the base of  the skull through the vertex without contrast. Multidetector CT imaging of the orbits was performed using the standard protocol without intravenous contrast. COMPARISON:  CT head 02/17/2016 FINDINGS: CT HEAD FINDINGS Brain: Image quality degraded by motion. Allowing for this, no acute intracranial abnormality. Mild atrophy. No acute infarct, hemorrhage, or mass lesion. Vascular: No hyperdense vessel or unexpected calcification. Skull: Negative for skull fracture. Right facial fractures as described below. Sinuses/Orbits: Air-fluid level right maxillary sinus. Other: Soft tissue swelling lateral to the right orbit. CT ORBITS FINDINGS Multiple fractures involving the right face and orbit. Nondisplaced  fracture of the right orbital roof. Nondisplaced fracture right orbital floor. Slightly displaced fracture right lateral orbit. Mildly displaced fracture right zygomatic arch. Mildly displaced fracture right anterior and lateral wall of the maxillary sinus. The entire maxillary sinus was not included on the study. Air-fluid level right maxillary sinus compatible with blood. No fracture of the pterygoid plates. No fracture in the left face. Mandible incompletely imaged on this study. Soft tissue swelling anterior and lateral to the right orbit. No edema within the orbit. IMPRESSION: No acute intracranial abnormality.  CT head degraded by motion Multiple fractures in the right face and orbit as described above. Air-fluid level right maxillary sinus compatible with blood. Electronically Signed   By: Franchot Gallo M.D.   On: 03/15/2016 14:53        Scheduled Meds: . amoxicillin-clavulanate  1 tablet Oral Q12H  . calcium-vitamin D  1 tablet Oral Q breakfast  . cloNIDine  0.2 mg Oral Once  . lisinopril  20 mg Oral Daily  . loratadine  10 mg Oral Daily  . magnesium oxide  200 mg Oral Daily  . multivitamin with minerals  1 tablet Oral Daily  . omega-3 acid ethyl esters  1 g Oral BID  . phenytoin  300 mg Oral QHS  . sodium chloride flush  3 mL Intravenous Q12H  . tamsulosin  0.4 mg Oral Daily   Continuous Infusions:    LOS: 1 day    Time spent: 40 minutes    Codie Hainer, MD [facility] Pager [phone]  If 7PM-7AM, please contact night-coverage [email or web address] Password [facility] 03/16/2016, 7:18 PM

## 2016-03-16 NOTE — Evaluation (Signed)
Occupational Therapy Evaluation Patient Details Name: Noah Rose MRN: [id number] DOB: 03-Jul-1950 Today's Date: 03/16/2016    History of Present Illness Noah Rose is a 65 y.o. male with medical history significant of hypertension, medication noncompliance, allergies, seizure disorder, obstructive sleep apnea not on a sleep apnea machine due to intolerance presented to the ED with probable seizure episode. Right tripod fractures, R eye injury   Clinical Impression   Pt with decline in function and safety with ADLs and ADL mobility with decreased balance and acitivty tolerance. Pt would benefit from acute OT services to address impairments to increase level of function and safety    Follow Up Recommendations  No OT follow up;Supervision/Assistance - 24 hour (initially, 24 hour sup)    Equipment Recommendations  None recommended by OT    Recommendations for Other Services       Precautions / Restrictions Precautions Precautions: Fall Restrictions Weight Bearing Restrictions: No      Mobility Bed Mobility Overal bed mobility: Needs Assistance Bed Mobility: Supine to Sit     Supine to sit: Supervision     General bed mobility comments: pt initiating standing before instructed by therapy, impulsive  Transfers Overall transfer level: Needs assistance Equipment used: None Transfers: Sit to/from Stand Sit to Stand: Min guard         General transfer comment: cues for safety to slow pace of speed    Balance Overall balance assessment: Needs assistance   Sitting balance-Leahy Scale: Good     Standing balance support: During functional activity Standing balance-Leahy Scale: Poor Standing balance comment: decreased dynamic standing balance                            ADL Overall ADL's : Needs assistance/impaired     Grooming: Wash/dry hands;Wash/dry face;Min guard;Standing   Upper Body Bathing: Standing;Min guard   Lower Body Bathing:  Min guard;Sit to/from stand   Upper Body Dressing : Min guard;Standing   Lower Body Dressing: Sit to/from stand;Min guard   Toilet Transfer: Min guard;Grab bars;Regular Toilet;Cueing for safety   Toileting- Clothing Manipulation and Hygiene: Min guard;Sit to/from stand   Tub/ Banker: Grab bars;Ambulation;Cueing for safety   Functional mobility during ADLs: Min guard;Cueing for safety General ADL Comments: pt with decreased standing dynamic balance, cues for safety and speed control     Vision  R eye periphery impaired          Pertinent Vitals/Pain Pain Assessment: No/denies pain     Hand Dominance Right   Extremity/Trunk Assessment Upper Extremity Assessment Upper Extremity Assessment: Overall WFL for tasks assessed   Lower Extremity Assessment Lower Extremity Assessment: Defer to PT evaluation       Communication     Cognition Arousal/Alertness: Awake/alert Behavior During Therapy: Impulsive Overall Cognitive Status: Within Functional Limits for tasks assessed                     General Comments   pt  Pleasant and cooperative, impulsive/decreased safety awareness                 Home Living Family/patient expects to be discharged to:: Private residence Living Arrangements: Spouse/significant other   Type of Home: House Home Access: Stairs to enter [employer] of Steps: 4 Entrance Stairs-Rails: Right;Left;Can reach both Home Layout: Two level;Able to live on main level with bedroom/bathroom;Bed/bath upstairs     Bathroom Shower/Tub: Tub/shower unit;Walk-in shower  Bathroom Toilet: Standard     Home Equipment: None          Prior Functioning/Environment Level of Independence: Independent                 OT Problem List: Impaired balance (sitting and/or standing);Decreased activity tolerance;Decreased knowledge of use of DME or AE;Decreased safety awareness   OT Treatment/Interventions: Self-care/ADL  training;DME and/or AE instruction;Therapeutic activities;Balance training;Patient/family education    OT Goals(Current goals can be found in the care plan section) Acute Rehab OT Goals OT Goal Formulation: With patient/family Time For Goal Achievement: 03/23/16 Potential to Achieve Goals: Good ADL Goals Pt Will Perform Grooming: with supervision;with set-up;with caregiver independent in assisting;standing Pt Will Perform Upper Body Bathing: with set-up;with supervision;with caregiver independent in assisting;standing Pt Will Perform Lower Body Bathing: with set-up;with supervision;with caregiver independent in assisting;sit to/from stand Pt Will Perform Upper Body Dressing: with set-up;with supervision;standing;with caregiver independent in assisting Pt Will Perform Lower Body Dressing: with supervision;with set-up;with caregiver independent in assisting;sit to/from stand Pt Will Transfer to Toilet: with supervision;regular height toilet;ambulating Pt Will Perform Toileting - Clothing Manipulation and hygiene: with supervision;with caregiver independent in assisting;sit to/from stand Pt Will Perform Tub/Shower Transfer: with supervision;with caregiver independent in assisting;ambulating Additional ADL Goal #1: Pt will gather ADL and toiletry items from closet and drawers with sup  OT Frequency: Min 2X/week   Barriers to D/C:    no barriers                     End of Session Equipment Utilized During Treatment: Gait belt  Activity Tolerance: Patient tolerated treatment well Patient left: in chair;with call bell/phone within reach;with chair alarm set;with family/visitor present   Time: 1352-1417 OT Time Calculation (min): 25 min Charges:  OT General Charges $OT Visit: 1 Procedure OT Evaluation $OT Eval Moderate Complexity: 1 Procedure G-Codes: OT G-codes **NOT FOR INPATIENT CLASS** Functional Assessment Tool Used: clinical judgement Functional Limitation: Self care Self  Care Current Status [id number]): At least 20 percent but less than 40 percent impaired, limited or restricted Self Care Goal Status [id number]): At least 1 percent but less than 20 percent impaired, limited or restricted  Noah Rose 03/16/2016, 3:25 PM

## 2016-03-16 NOTE — Progress Notes (Signed)
  Echocardiogram 2D Echocardiogram has been performed.  Diamond Nickel 03/16/2016, 12:51 PM

## 2016-03-16 NOTE — Progress Notes (Signed)
PT Cancellation Note  Patient Details Name: Noah Rose MRN: [id number] DOB: 08/13/1950   Cancelled Treatment:    Reason Eval/Treat Not Completed: Patient at procedure or test/unavailable   Duncan Dull 03/16/2016, 12:09 PM Alben Deeds, [place] DPT  [phone]

## 2016-03-16 NOTE — Care Management Obs Status (Signed)
[place] NOTIFICATION   Patient Details  Name: Noah Rose MRN: [id number] Date of Birth: 11/22/1950   Medicare Observation Status Notification Given:  Yes    Bethena Roys, RN 03/16/2016, 2:49 PM

## 2016-03-16 NOTE — Care Management CC44 (Signed)
Condition Code 44 Documentation Completed  Patient Details  Name: AERIK BANDURA MRN: [id number] Date of Birth: 08-Jan-1951   Condition Code 44 given:  Yes Patient signature on Condition Code 44 notice:  Yes Documentation of 2 MD's agreement:  Yes Code 44 added to claim:  Yes    Bethena Roys, RN 03/16/2016, 2:49 PM

## 2016-03-16 NOTE — Progress Notes (Signed)
Patient voided 100cc.  Post void bladder scan showed 255cc.  Dr. Grandville Silos notified via [place].  Will continue to monitor

## 2016-03-17 DIAGNOSIS — J309 Allergic rhinitis, unspecified: Secondary | ICD-10-CM | POA: Diagnosis not present

## 2016-03-17 DIAGNOSIS — G40209 Localization-related (focal) (partial) symptomatic epilepsy and epileptic syndromes with complex partial seizures, not intractable, without status epilepticus: Secondary | ICD-10-CM | POA: Diagnosis not present

## 2016-03-17 DIAGNOSIS — R569 Unspecified convulsions: Secondary | ICD-10-CM | POA: Diagnosis not present

## 2016-03-17 DIAGNOSIS — S02402D Zygomatic fracture, unspecified, subsequent encounter for fracture with routine healing: Secondary | ICD-10-CM | POA: Diagnosis not present

## 2016-03-17 DIAGNOSIS — R339 Retention of urine, unspecified: Secondary | ICD-10-CM

## 2016-03-17 DIAGNOSIS — S0280XA Fracture of other specified skull and facial bones, unspecified side, initial encounter for closed fracture: Secondary | ICD-10-CM | POA: Diagnosis not present

## 2016-03-17 LAB — URINE CULTURE

## 2016-03-17 LAB — COMPREHENSIVE METABOLIC PANEL
ALBUMIN: 3.6 g/dL (ref 3.5–5.0)
ALK PHOS: 69 U/L (ref 38–126)
ALT: 25 U/L (ref 17–63)
AST: 23 U/L (ref 15–41)
Anion gap: 6 (ref 5–15)
BILIRUBIN TOTAL: 0.8 mg/dL (ref 0.3–1.2)
BUN: 15 mg/dL (ref 6–20)
CALCIUM: 8.9 mg/dL (ref 8.9–10.3)
CO2: 25 mmol/L (ref 22–32)
Chloride: 107 mmol/L (ref 101–111)
Creatinine, Ser: 1.11 mg/dL (ref 0.61–1.24)
GFR calc Af Amer: >60 mL/min (ref >60)
GFR calc non Af Amer: >60 mL/min (ref >60)
GLUCOSE: 93 mg/dL (ref 65–99)
Potassium: 3.7 mmol/L (ref 3.5–5.1)
Sodium: 138 mmol/L (ref 135–145)
TOTAL PROTEIN: 6.3 g/dL — AB (ref 6.5–8.1)

## 2016-03-17 LAB — CBC
HEMATOCRIT: 43.8 % (ref 39.0–52.0)
HEMOGLOBIN: 14.2 g/dL (ref 13.0–17.0)
MCH: 27.3 pg (ref 26.0–34.0)
MCHC: 32.4 g/dL (ref 30.0–36.0)
MCV: 84.2 fL (ref 78.0–100.0)
Platelets: 183 10*3/uL (ref 150–400)
RBC: 5.2 MIL/uL (ref 4.22–5.81)
RDW: 14.3 % (ref 11.5–15.5)
WBC: 7.3 10*3/uL (ref 4.0–10.5)

## 2016-03-17 LAB — HEMOGLOBIN A1C
Hgb A1c MFr Bld: 5.2 % (ref 4.8–5.6)
Mean Plasma Glucose: 103 mg/dL

## 2016-03-17 LAB — PHENYTOIN LEVEL, TOTAL: PHENYTOIN LVL: 16.7 ug/mL (ref 10.0–20.0)

## 2016-03-17 LAB — GLUCOSE, CAPILLARY: GLUCOSE-CAPILLARY: 94 mg/dL (ref 65–99)

## 2016-03-17 MED ORDER — AMOXICILLIN-POT CLAVULANATE 875-125 MG PO TABS: 1.0000 | ORAL_TABLET | Freq: Two times a day (BID) | ORAL | 0 refills | Status: AC

## 2016-03-17 MED ORDER — LISINOPRIL 20 MG PO TABS: 20.0000 mg | ORAL_TABLET | Freq: Every day | ORAL | 2 refills | Status: DC

## 2016-03-17 MED ORDER — PHENYTOIN SODIUM EXTENDED 300 MG PO CAPS: 300.0000 mg | ORAL_CAPSULE | Freq: Every day | ORAL | 4 refills | Status: DC

## 2016-03-17 MED ORDER — TAMSULOSIN HCL 0.4 MG PO CAPS: 0.4000 mg | ORAL_CAPSULE | Freq: Every day | ORAL | 3 refills | Status: DC

## 2016-03-17 NOTE — Consult Note (Addendum)
NEURO HOSPITALIST CONSULT NOTE   Requestig physician: Dr. Johnney Killian   Reason for Consult: Seizure?   History obtained from:  Patient  And family  Subjective:  Doing well this am, no complaints  Past Medical History:  Diagnosis Date  . Anxiety   . Dizziness   . High blood pressure   . Hypertension   . Multiple allergies   . Seizures ([place])     History reviewed. No pertinent surgical history.  Family History  Problem Relation Age of Onset  . Dementia Mother   . Heart disease Father   . Parkinson's disease Sister   . Diabetes Sister   . Pancreatic cancer Sister   . Leukemia Sister       Social History:  reports that he has never smoked. He has never used smokeless tobacco. He reports that he does not drink alcohol or use drugs.  Allergies  Allergen Reactions  . Almond Oil Shortness Of Breath  . Corn Oil Shortness Of Breath  . Dust Mite Mixed Allergen Ext [Mite (D. Farinae)] Shortness Of Breath  . Salicylates Shortness Of Breath and Other (See Comments)  . Amlodipine     dizziness  . Amphetamines     dizziness  . Aspirin     dizziness  . Beet [Beta Vulgaris]   . Cantaloupe (Diagnostic)   . Carrot [Daucus Carota]   . Cheese     American cheddar  . Corn-Containing Products   . Dextroamphetamine     dizziness  . Felodipine     dizziness  . Fructose   . Green Dyes     #3   . Indapamide     dizziness  . Iodinated Diagnostic Agents Other (See Comments)    Uncoded Allergy. Allergen: Preservatives, Other Reaction: HTN CRISIS Uncoded Allergy. Allergen: ANTICONVULSANTS  . Lambs Quarters     lamb  . Milk-Related Compounds   . Madelaine Bhat Isothiocyanate]   . Pineapple   . Plum Pulp   . Raspberry   . Red Dye     #40  . Scallops [Shellfish Allergy]   . Tea   . Vanilla   . Whey     MEDICATIONS:                                                                                                                     I have reviewed  the patient's current medications.   ROS:  History obtained from chart review and the patient  General ROS: negative for - chills, fatigue, fever, night sweats, weight gain or weight loss Psychological ROS: negative for - behavioral disorder, hallucinations, memory difficulties, mood swings or suicidal ideation Ophthalmic ROS: negative for - blurry vision, double vision, eye pain or loss of vision ENT ROS: negative for - epistaxis, nasal discharge, oral lesions, sore throat, tinnitus or vertigo Allergy and Immunology ROS: negative for - hives or itchy/watery eyes Hematological and Lymphatic ROS: negative for - bleeding problems, bruising or swollen lymph nodes Endocrine ROS: negative for - galactorrhea, hair pattern changes, polydipsia/polyuria or temperature intolerance Respiratory ROS: negative for - cough, hemoptysis, shortness of breath or wheezing Cardiovascular ROS: negative for - chest pain, dyspnea on exertion, edema or irregular heartbeat Gastrointestinal ROS: negative for - abdominal pain, diarrhea, hematemesis, nausea/vomiting or stool incontinence Genito-Urinary ROS: negative for - dysuria, hematuria, incontinence or urinary frequency/urgency Musculoskeletal ROS: negative for - joint swelling or muscular weakness Neurological ROS: as noted in HPI Dermatological ROS: negative for rash and skin lesion changes   Blood pressure (!) 149/91, pulse 72, temperature 98.8 F (37.1 C), temperature source Oral, resp. rate 15, weight 79.7 kg (175 lb 9.6 oz), SpO2 97 %.   Neurologic Examination:                                                                                                      HEENT-  Normocephalic, no lesions, without obvious abnormality.  Normal external eye and conjunctiva.  Normal TM's bilaterally.  Normal auditory canals and external  ears. Normal external nose, mucus membranes and septum.  Normal pharynx. Cardiovascular- regular rate and rhythm, S1, S2 normal, no murmur, click, rub or gallop, pulses palpable throughout   Lungs- chest clear, no wheezing, rales, normal symmetric air entry, Heart exam - S1, S2 normal, no murmur, no gallop, rate regular Abdomen- soft, non-tender; bowel sounds normal; no masses,  no organomegaly   Neurological Examination Mental Status: Alert, oriented, thought content appropriate.  Speech fluent without evidence of aphasia.  Able to follow 3 step commands without difficulty. Cranial Nerves: II:  Visual fields grossly normal, pupils equal, round, reactive to light and accommodation III,IV, VI: ptosis not present, extra-ocular motions intact bilaterally V,VII: smile symmetric, facial light touch sensation normal bilaterally VIII: hearing normal bilaterally IX,X: uvula rises symmetrically XI: bilateral shoulder shrug XII: midline tongue extension Motor: Right : Upper extremity   5/5    Left:     Upper extremity   5/5  Lower extremity   5/5     Lower extremity   5/5 Tone and bulk:normal tone throughout; no atrophy noted Sensory: Pinprick and light touch intact throughout, bilaterally Cerebellar: normal finger-to-nose,      Lab Results: Basic Metabolic Panel:  Recent Labs Lab 03/15/16 1549 03/16/16 0010 03/16/16 0526 03/17/16 0409  NA 137  --  137 138  K 3.5  --  3.3* 3.7  CL 103  --  105 107  CO2 24  --  28 25  GLUCOSE 96  --  111* 93  BUN  14  --  10 15  CREATININE 1.07  --  0.95 1.11  CALCIUM 9.0  --  8.8* 8.9  MG  --  2.1  --   --     Liver Function Tests:  Recent Labs Lab 03/16/16 0526 03/17/16 0409  AST 29 23  ALT 30 25  ALKPHOS 72 69  BILITOT 1.0 0.8  PROT 6.4* 6.3*  ALBUMIN 3.8 3.6   No results for input(s): LIPASE, AMYLASE in the last 168 hours. No results for input(s): AMMONIA in the last 168 hours.  CBC:  Recent Labs Lab 03/15/16 1549  03/16/16 0526 03/17/16 0409  WBC 11.0* 8.1 7.3  HGB 15.0 14.6 14.2  HCT 45.4 45.0 43.8  MCV 83.2 83.8 84.2  PLT 211 192 183    Cardiac Enzymes:  Recent Labs Lab 03/15/16 1549  TROPONINI <0.03    Lipid Panel: No results for input(s): CHOL, TRIG, HDL, CHOLHDL, VLDL, LDLCALC in the last 168 hours.  CBG:  Recent Labs Lab 03/17/16 [id number]    Microbiology: Results for orders placed or performed during the hospital encounter of 03/15/16  Urine culture     Status: Abnormal   Collection Time: 03/15/16  8:30 PM  Result Value Ref Range Status   Specimen Description URINE, CATHETERIZED  Final   Special Requests NONE  Final   Culture MULTIPLE SPECIES PRESENT, SUGGEST RECOLLECTION (A)  Final   Report Status 03/17/2016 FINAL  Final  MRSA PCR Screening     Status: None   Collection Time: 03/15/16  9:30 PM  Result Value Ref Range Status   MRSA by PCR NEGATIVE NEGATIVE Final    Comment:        The GeneXpert MRSA Assay (FDA approved for NASAL specimens only), is one component of a comprehensive MRSA colonization surveillance program. It is not intended to diagnose MRSA infection nor to guide or monitor treatment for MRSA infections.     Coagulation Studies:  Recent Labs  03/15/16 1549  LABPROT 14.8  INR 1.15    Imaging: Ct Head Wo Contrast  Result Date: 03/15/2016 CLINICAL DATA:  Fall.  Right orbital swelling EXAM: CT HEAD AND ORBITS WITHOUT CONTRAST TECHNIQUE: Contiguous axial images were obtained from the base of the skull through the vertex without contrast. Multidetector CT imaging of the orbits was performed using the standard protocol without intravenous contrast. COMPARISON:  CT head 02/17/2016 FINDINGS: CT HEAD FINDINGS Brain: Image quality degraded by motion. Allowing for this, no acute intracranial abnormality. Mild atrophy. No acute infarct, hemorrhage, or mass lesion. Vascular: No hyperdense vessel or unexpected calcification. Skull: Negative for  skull fracture. Right facial fractures as described below. Sinuses/Orbits: Air-fluid level right maxillary sinus. Other: Soft tissue swelling lateral to the right orbit. CT ORBITS FINDINGS Multiple fractures involving the right face and orbit. Nondisplaced fracture of the right orbital roof. Nondisplaced fracture right orbital floor. Slightly displaced fracture right lateral orbit. Mildly displaced fracture right zygomatic arch. Mildly displaced fracture right anterior and lateral wall of the maxillary sinus. The entire maxillary sinus was not included on the study. Air-fluid level right maxillary sinus compatible with blood. No fracture of the pterygoid plates. No fracture in the left face. Mandible incompletely imaged on this study. Soft tissue swelling anterior and lateral to the right orbit. No edema within the orbit. IMPRESSION: No acute intracranial abnormality.  CT head degraded by motion Multiple fractures in the right face and orbit as described above. Air-fluid level right maxillary sinus compatible with  blood. Electronically Signed   By: Franchot Gallo M.D.   On: 03/15/2016 14:53   Mr Jeri Cos [id number] Contrast  Result Date: 03/16/2016 CLINICAL DATA:  Initial evaluation for acute seizure. History of seizures. EXAM: MRI HEAD WITHOUT AND WITH CONTRAST TECHNIQUE: Multiplanar, multiecho pulse sequences of the brain and surrounding structures were obtained without and with intravenous contrast. CONTRAST:  67mL MULTIHANCE GADOBENATE DIMEGLUMINE 529 MG/ML IV SOLN COMPARISON:  Prior CT from 03/15/2016. Comparison also made with prior MRI from 11/10/2006. FINDINGS: Brain: Study mildly degraded by motion artifact. Cerebral volume within normal limits for patient age. Mild T2/FLAIR hyperintensity within the periventricular and deep white matter both cerebral hemispheres present, mild for age, and most likely related to chronic microvascular ischemic changes. No abnormal foci of restricted diffusion to suggest acute or  subacute ischemia. Gray-white matter differentiation maintained. No evidence for acute or chronic intracranial hemorrhage. No areas of chronic infarction identified. Thin section imaging through the hippocampi demonstrates a normal appearance with normal morphology and signal intensity bilaterally. No mass lesion midline shift, or mass effect. No hydrocephalus. No extra-axial fluid collection. Major dural sinuses are grossly patent. No abnormal enhancement. Vascular: Major intracranial vascular flow voids are well maintained. Skull and upper cervical spine: Craniocervical junction normal. Partially visualized upper cervical spine unremarkable without significant degenerative changes are stenosis. Bone marrow signal intensity within normal limits. No scalp soft tissue abnormality. Sinuses/Orbits: Globes and orbits within normal limits. Scattered mucosal thickening within the ethmoidal air cells, frontal sinuses, and sphenoid sinuses. Mucosal thickening within the max O sinuses. Fluid level present within the right maxillary sinus. No mastoid effusion. Inner ear structures grossly unremarkable. Other: No other significant finding. IMPRESSION: 1. Normal brain MRI for patient age. No acute intracranial abnormality identified. 2. Moderate paranasal sinus disease as above. Air-fluid level within the right maxillary sinus suggestive of acute sinusitis. Electronically Signed   By: Jeannine Boga M.D.   On: 03/16/2016 04:47   Dg Chest Port 1 View  Result Date: 03/15/2016 CLINICAL DATA:  Acute onset of leukocytosis.  Initial encounter. EXAM: PORTABLE CHEST 1 VIEW COMPARISON:  Chest radiograph performed 07/27/2012 FINDINGS: The lungs are mildly hypoexpanded. Mild bibasilar opacities may reflect atelectasis or possibly mild infection. Calcified granulomata are noted at the left lung. No pleural effusion or pneumothorax is seen. The cardiomediastinal silhouette is within normal limits. No acute osseous abnormalities  are seen. IMPRESSION: Lungs mildly hypoexpanded. Mild bibasilar opacities may reflect atelectasis or possibly mild infection, depending on the patient's symptoms. Electronically Signed   By: Garald Balding M.D.   On: 03/15/2016 20:14   Ct Orbitss W/o Cm  Result Date: 03/15/2016 CLINICAL DATA:  Fall.  Right orbital swelling EXAM: CT HEAD AND ORBITS WITHOUT CONTRAST TECHNIQUE: Contiguous axial images were obtained from the base of the skull through the vertex without contrast. Multidetector CT imaging of the orbits was performed using the standard protocol without intravenous contrast. COMPARISON:  CT head 02/17/2016 FINDINGS: CT HEAD FINDINGS Brain: Image quality degraded by motion. Allowing for this, no acute intracranial abnormality. Mild atrophy. No acute infarct, hemorrhage, or mass lesion. Vascular: No hyperdense vessel or unexpected calcification. Skull: Negative for skull fracture. Right facial fractures as described below. Sinuses/Orbits: Air-fluid level right maxillary sinus. Other: Soft tissue swelling lateral to the right orbit. CT ORBITS FINDINGS Multiple fractures involving the right face and orbit. Nondisplaced fracture of the right orbital roof. Nondisplaced fracture right orbital floor. Slightly displaced fracture right lateral orbit. Mildly displaced fracture right zygomatic arch. Mildly  displaced fracture right anterior and lateral wall of the maxillary sinus. The entire maxillary sinus was not included on the study. Air-fluid level right maxillary sinus compatible with blood. No fracture of the pterygoid plates. No fracture in the left face. Mandible incompletely imaged on this study. Soft tissue swelling anterior and lateral to the right orbit. No edema within the orbit. IMPRESSION: No acute intracranial abnormality.  CT head degraded by motion Multiple fractures in the right face and orbit as described above. Air-fluid level right maxillary sinus compatible with blood. Electronically Signed    By: Franchot Gallo M.D.   On: 03/15/2016 14:53       Assessment/Plan:  65 y.o. male with hx as below presents with hypertensive urgency, fall orbital fx and possible seizure like activity. Patient has hx of complex partial seizure but is non compliant with his medication as per his wife.  Patient doing well this am, appears to be tolerating Dilantin well  - Level pending this am, will follow and adjust as necessary - gave patient the number to a epilepsy doctor in [place], Dr.[facility] for him to call and establish a follow up - MRI brain negative for anything acute  No further neurological workup at this time, please call back with any questions  Addendum  Dilantin level is 16.7, continue current dose

## 2016-03-17 NOTE — Plan of Care (Signed)
Problem: Health Behavior: Goal: Compliance with prescribed medication regimen will improve Outcome: Not Progressing Pt displaying some non compliance with taking medications. Pt believes that his medications are making him worse and has refused to take them. Pt only took 200mg  of the prescribed 300mg  dose of dilantin even after much education of the importance of taking this med.

## 2016-03-17 NOTE — Care Management Note (Signed)
Case Management Note  Patient Details  Name: Noah Rose MRN: [id number] Date of Birth: 02-27-1951  Subjective/Objective:   Pt presented for Seizure. Pt is from home with wife. Plan to be d/c home 03-17-16.                 Action/Plan: CM did receive referral for [place]. CM did offer choice to patient and wife. Pt chose [facility] for services. SOC to begin within 24-48 hours post d/c. No further needs from CM at this time.   Expected Discharge Date:                  Expected Discharge Plan:  [place]  In-House Referral:  NA  Discharge planning Services  CM Consult  Post Acute Care Choice:  Home Health Choice offered to:  Patient  DME Arranged:  N/A DME Agency:  NA  HH Arranged:  RN, PT [place] Agency:  [facility]  Status of Service:  Completed, signed off  If discussed at [place] of Stay Meetings, dates discussed:    Additional Comments:  Bethena Roys, RN 03/17/2016, 2:38 PM

## 2016-03-17 NOTE — Progress Notes (Signed)
Patient being discharged home per MD order. All discharge instructions given to patient in written form along with 4 Prescriptions.

## 2016-03-17 NOTE — Progress Notes (Signed)
PT Cancellation Note  Patient Details Name: Noah Rose MRN: [id number] DOB: 09/25/1950   Cancelled Treatment:    Reason Eval/Treat Not Completed: Other (comment) (Declined as he is leaving today).  Invited pt and his wife to call back to hospital if they have questions after leaving today.   Ramond Dial 03/17/2016, 1:33 PM    Mee Hives, PT MS Acute Rehab Dept. Number: [place]

## 2016-03-17 NOTE — Discharge Summary (Signed)
Physician Discharge Summary  Noah Rose [id number] DOB: [birth year] DOA: 03/15/2016  PCP: Idelle Crouch, MD  Admit date: 03/15/2016 Discharge date: 03/17/2016  Time spent: 65 minutes  Recommendations for Outpatient Follow-up:  1. Follow-up with SPARKS,JEFFREY D, MD in 1 week. On follow-up patient's blood pressure will need to be reassessed. Patient will need a basic metabolic profile done to follow-up on electrolytes and renal function. Patient also will need a Dilantin level checked. Patient's urinary frequency/probable urinary retention will also need to be followed up upon. 2. Follow-up with Dr. Thea Gist in one week. 3. Follow-up with Dr. Lindell Spar, neurology in 1-2 weeks for follow-up on seizures. Patient may follow up with neurologist of choice. Patient will likely need a Dilantin level checked on follow-up.   Discharge Diagnoses:  Principal Problem:   Seizure  [facility]) Active Problems:   Hypertensive urgency   Orbital fracture (HCC)   Facial fracture (HCC)   Sinusitis, acute, maxillary: Right   OSA (obstructive sleep apnea)   Chronic allergic rhinitis   Chronic anxiety   Leukocytosis   Seizures ([facility])   Urinary retention: Probable   Discharge Condition: Stable and improved  Diet recommendation: Regular  Filed Weights   03/15/16 1653 03/15/16 2125 03/17/16 0510  Weight: 83.9 kg (185 lb) 80.3 kg (177 lb) 79.7 kg (175 lb 9.6 oz)    History of present illness:  Noah Rose is a 65 y.o. male with medical history significant of hypertension, medication noncompliance, allergies, seizure disorder, obstructive sleep apnea not on a sleep apnea machine due to intolerance presented to the ED with probable seizure episode. Patient's wife stated that for several months now patient has had some increased urination from time to time where he has increased urinary urgency and also noted that he's been hypertensive. Patient not taking his medications as prescribed due to  complaints of various intolerances to medications. Per patient's wife patient was changing ink cartridge on the printer the morning Of admission, when she heard a loud crash and came running from the kitchen and noted that patient was facedown in the pool of blood and initially lethargic but slowly became more responsive with associated bladder incontinence. Patient's wife and her daughter since the patient and it was recommended the patient be brought to the emergency room. Patient denied any recent fevers, no chills, no nausea, no vomiting, no chest, no shortness of breath, no abdominal pain, no diarrhea, no constipation, no dysuria, no weakness, no cough, no melena, no hematemesis, no hematochezia. Patient was seen in the ED and per ED physician en route to CT, patient was noted to have another seizure. Patient was given some Ativan with improvement. ED physician stated neurology was consulted on the patient had initially recommended that nicardipine drip. Pulmonary was consulted who felt that patient's hypertensive urgency was more chronic in nature and no need for admission to the ICU and as such [facility] were called to admit the patient.  ED Course: the ED CThead and orbits showed no acute intracranial abnormality. Multiple fractures in the right face and orbits. Air-fluid level right maxillary sinus compatible with blood.basic metabolic profile unremarkable. Troponin less than 0.03. CBC with a white count of 11 otherwise within normal limits.urinalysis was not done. Chest x-ray not done.   Hospital Course:  #1 seizures Patient with known prior history of seizures. Patient presented with seizures which led to facial bone and orbital bone fractures. Patient currently seizure-free. MRI head negative. EEG predominantly drowsy and asleep EEG however occasional focal  slowing over bilateral temporal regions, frequent left frontotemporal epileptiform discharges, occasional right mid temporal  epileptiform discharges. Patient was loaded with Dilantin on admission per neurology recommendations and patient currently on Dilantin 300 mg daily at bedtime. Check a Dilantin level tomorrow morning. Neurology following.  #2 hypertensive urgency Secondary to medical noncompliance. Patient has been resumed back on home regimen of lisinopril 20 mg daily with significant improvement with blood pressure. 2-D echo was obtained with EF of 55-60% with no wall motion abnormalities. Grade 2 diastolic dysfunction. Right atrium was mildly dilated. The importance of medication compliance was stressed to patient and patient expressed understanding. Patient be discharged home in stable and improved condition. On day of discharge patient's blood pressure was 138/88. Outpatient follow-up.  #3 obstructive sleep apnea  #4 right facial fractures/right orbital fracture Secondary to problem #1. Patient has been assessed by ENT, Dr.Teoh who is recommended outpatient follow-up in 1 week to reassess minimally displaced right tripod fractures with no entrapment, diplopia or significant trismus.   #5 acute right maxillary sinusitis Per CT head. Patient also had some complaints of some facial pain on admission and a leukocytosis. Patient was started on Augmentin with some clinical improvement. Patient be discharged on 5 more days of oral Augmentin to complete a course of antibiotic treatment.   #6 leukocytosis Likely secondary to problem #5 versus problem #1. WBC trended down. Patient has been started on Augmentin for acute sinusitis. Outpatient follow-up.  #7 hypokalemia Repleted.  #8 chronic allergic rhinitis Maintained on home regimen of Xyzal  #9 urinary frequency/probable urinary retention Concern for urinary retention. Urinalysis negative for acute UTI. Hemoglobin A1c 5.2. Patient with 250 mL postvoid residual. Patient was started on Flomax with some improvement. Outpatient follow-up.     Procedures:  MRI head 03/16/2016  Chest x-ray 03/15/2016  2-D echo 03/16/2016  CT head CT orbits 03/15/2016  EEG 03/16/2016  Consultations:  Neurology: Dr. Cristobal Goldmann 03/15/2016  ENT: Dr. Benjamine Mola 03/16/2016  PCCM: Dr Lake Bells 03/15/2016   Discharge Exam: Vitals:   03/17/16 0747 03/17/16 1211  BP: (!) 149/91 138/88  Pulse: 72 73  Resp:  15  Temp: 98.8 F (37.1 C) 98.2 F (36.8 C)    General: NAD Cardiovascular: RRR Respiratory: CTAB  Discharge Instructions   Discharge Instructions    Diet general    Complete by:  As directed    Discharge instructions    Complete by:  As directed    PLEASE TAKE ALL MEDICATIONS PRESCRIBED. Follow up with SPARKS,JEFFREY D, MD in 1 week. Follow up with Dr Benjamine Mola, ENT in 1 week.   Increase activity slowly    Complete by:  As directed      Current Discharge Medication List    START taking these medications   Details  amoxicillin-clavulanate (AUGMENTIN) 875-125 MG tablet Take 1 tablet by mouth every 12 (twelve) hours. Qty: 10 tablet, Refills: 0    phenytoin (DILANTIN) 300 MG ER capsule Take 1 capsule (300 mg total) by mouth at bedtime. Qty: 30 capsule, Refills: 4    tamsulosin (FLOMAX) 0.4 MG CAPS capsule Take 1 capsule (0.4 mg total) by mouth daily. Qty: 30 capsule, Refills: 3      CONTINUE these medications which have CHANGED   Details  lisinopril (PRINIVIL,ZESTRIL) 20 MG tablet Take 1 tablet (20 mg total) by mouth daily. Qty: 30 tablet, Refills: 2      CONTINUE these medications which have NOT CHANGED   Details  calcium-vitamin D (OSCAL WITH D) 500-200 MG-UNIT per tablet  Take 1 tablet by mouth daily with breakfast.    clonazePAM (KLONOPIN) 0.5 MG tablet Take 0.5 mg by mouth 2 (two) times daily as needed. For anxiety.     MAGNESIUM PO Take 1 tablet by mouth daily.    Multiple Vitamin (MULTIVITAMIN WITH MINERALS) TABS tablet Take 1 tablet by mouth daily.    omega-3 acid ethyl esters (LOVAZA) 1 G capsule Take 1  g by mouth 2 (two) times daily.    sodium chloride (OCEAN) 0.65 % SOLN nasal spray Place 1 spray into both nostrils as needed for congestion.    levocetirizine (XYZAL) 5 MG tablet Take 5 mg by mouth every evening.      STOP taking these medications     cloNIDine (CATAPRES) 0.2 MG tablet      Potassium Gluconate 550 MG TABS      cloNIDine (CATAPRES) 0.3 MG tablet        Allergies  Allergen Reactions  . Almond Oil Shortness Of Breath  . Corn Oil Shortness Of Breath  . Dust Mite Mixed Allergen Ext [Mite (D. Farinae)] Shortness Of Breath  . Salicylates Shortness Of Breath and Other (See Comments)  . Amlodipine     dizziness  . Amphetamines     dizziness  . Aspirin     dizziness  . Beet [Beta Vulgaris]   . Cantaloupe (Diagnostic)   . Carrot [Daucus Carota]   . Cheese     American cheddar  . Corn-Containing Products   . Dextroamphetamine     dizziness  . Felodipine     dizziness  . Fructose   . Green Dyes     #3   . Indapamide     dizziness  . Iodinated Diagnostic Agents Other (See Comments)    Uncoded Allergy. Allergen: Preservatives, Other Reaction: HTN CRISIS Uncoded Allergy. Allergen: ANTICONVULSANTS  . Lambs Quarters     lamb  . Milk-Related Compounds   . Madelaine Bhat Isothiocyanate]   . Pineapple   . Plum Pulp   . Raspberry   . Red Dye     #40  . Scallops [Shellfish Allergy]   . Tea   . Vanilla   . Whey    Follow-up Information    Encarnacion Slates, MD. Schedule an appointment as soon as possible for a visit in 2 week(s).   Specialty:  Neurology Why:  f/u in 1-2 weeks  Contact information: [address] [place] [address] [phone]        Idelle Crouch, MD. Go on 03/24/2016.   Specialty:  Internal Medicine Why:  hospital follow up @ 10am Contact information: [place] [address] [phone]        Ascencion Dike, MD. Schedule an appointment as soon as possible for a  visit in 1 week.   Specialty:  Otolaryngology Contact information: [address] [place] [address] [phone]            The results of significant diagnostics from this hospitalization (including imaging, microbiology, ancillary and laboratory) are listed below for reference.    Significant Diagnostic Studies: Ct Head Wo Contrast  Result Date: 03/15/2016 CLINICAL DATA:  Fall.  Right orbital swelling EXAM: CT HEAD AND ORBITS WITHOUT CONTRAST TECHNIQUE: Contiguous axial images were obtained from the base of the skull through the vertex without contrast. Multidetector CT imaging of the orbits was performed using the standard protocol without intravenous contrast. COMPARISON:  CT head 02/17/2016  FINDINGS: CT HEAD FINDINGS Brain: Image quality degraded by motion. Allowing for this, no acute intracranial abnormality. Mild atrophy. No acute infarct, hemorrhage, or mass lesion. Vascular: No hyperdense vessel or unexpected calcification. Skull: Negative for skull fracture. Right facial fractures as described below. Sinuses/Orbits: Air-fluid level right maxillary sinus. Other: Soft tissue swelling lateral to the right orbit. CT ORBITS FINDINGS Multiple fractures involving the right face and orbit. Nondisplaced fracture of the right orbital roof. Nondisplaced fracture right orbital floor. Slightly displaced fracture right lateral orbit. Mildly displaced fracture right zygomatic arch. Mildly displaced fracture right anterior and lateral wall of the maxillary sinus. The entire maxillary sinus was not included on the study. Air-fluid level right maxillary sinus compatible with blood. No fracture of the pterygoid plates. No fracture in the left face. Mandible incompletely imaged on this study. Soft tissue swelling anterior and lateral to the right orbit. No edema within the orbit. IMPRESSION: No acute intracranial abnormality.  CT head degraded by motion Multiple fractures in the right face and  orbit as described above. Air-fluid level right maxillary sinus compatible with blood. Electronically Signed   By: Franchot Gallo M.D.   On: 03/15/2016 14:53   Ct Head Wo Contrast  Result Date: 02/17/2016 CLINICAL DATA:  65 y/o M; two false today with possible seizures. Injury to the right forehead. EXAM: CT HEAD WITHOUT CONTRAST TECHNIQUE: Contiguous axial images were obtained from the base of the skull through the vertex without intravenous contrast. COMPARISON:  CT head 10/24/2013 and MR brain 11/10/2006 FINDINGS: Brain: No evidence of acute infarction, hemorrhage, hydrocephalus, extra-axial collection or mass lesion/mass effect. No significant interval change. Vascular: No hyperdense vessel. Mild calcifications of cavernous internal carotid arteries. Skull: Small contusion to the right frontal scalp. No skull fracture is identified. Sinuses/Orbits: No acute finding. Other: None. IMPRESSION: No acute intracranial abnormality is identified. No significant interval change. Electronically Signed   By: Kristine Garbe M.D.   On: 02/17/2016 23:28   Ct Cervical Spine Wo Contrast  Result Date: 02/18/2016 CLINICAL DATA:  Fall with neck soreness.  Initial encounter. EXAM: CT CERVICAL SPINE WITHOUT CONTRAST TECHNIQUE: Multidetector CT imaging of the cervical spine was performed without intravenous contrast. Multiplanar CT image reconstructions were also generated. COMPARISON:  None. FINDINGS: Alignment: Normal Skull base and vertebrae: No acute fracture. A lucency in the C4 left articular process on sagittal reformats is a small channel on axial images. No aggressive process. Incidental pseudoarticulation between the C1 transverse processes and the occiput. Soft tissues and canal: No prevertebral fluid. No gross canal hematoma. Degenerative: Negative for age. IMPRESSION: No evidence of cervical spine injury. Electronically Signed   By: Monte Fantasia M.D.   On: 02/18/2016 00:31   Mr Jeri Cos [id number]  Contrast  Result Date: 03/16/2016 CLINICAL DATA:  Initial evaluation for acute seizure. History of seizures. EXAM: MRI HEAD WITHOUT AND WITH CONTRAST TECHNIQUE: Multiplanar, multiecho pulse sequences of the brain and surrounding structures were obtained without and with intravenous contrast. CONTRAST:  77mL MULTIHANCE GADOBENATE DIMEGLUMINE 529 MG/ML IV SOLN COMPARISON:  Prior CT from 03/15/2016. Comparison also made with prior MRI from 11/10/2006. FINDINGS: Brain: Study mildly degraded by motion artifact. Cerebral volume within normal limits for patient age. Mild T2/FLAIR hyperintensity within the periventricular and deep white matter both cerebral hemispheres present, mild for age, and most likely related to chronic microvascular ischemic changes. No abnormal foci of restricted diffusion to suggest acute or subacute ischemia. Gray-white matter differentiation maintained. No evidence for acute or chronic intracranial hemorrhage.  No areas of chronic infarction identified. Thin section imaging through the hippocampi demonstrates a normal appearance with normal morphology and signal intensity bilaterally. No mass lesion midline shift, or mass effect. No hydrocephalus. No extra-axial fluid collection. Major dural sinuses are grossly patent. No abnormal enhancement. Vascular: Major intracranial vascular flow voids are well maintained. Skull and upper cervical spine: Craniocervical junction normal. Partially visualized upper cervical spine unremarkable without significant degenerative changes are stenosis. Bone marrow signal intensity within normal limits. No scalp soft tissue abnormality. Sinuses/Orbits: Globes and orbits within normal limits. Scattered mucosal thickening within the ethmoidal air cells, frontal sinuses, and sphenoid sinuses. Mucosal thickening within the max O sinuses. Fluid level present within the right maxillary sinus. No mastoid effusion. Inner ear structures grossly unremarkable. Other: No other  significant finding. IMPRESSION: 1. Normal brain MRI for patient age. No acute intracranial abnormality identified. 2. Moderate paranasal sinus disease as above. Air-fluid level within the right maxillary sinus suggestive of acute sinusitis. Electronically Signed   By: Jeannine Boga M.D.   On: 03/16/2016 04:47   Dg Chest Port 1 View  Result Date: 03/15/2016 CLINICAL DATA:  Acute onset of leukocytosis.  Initial encounter. EXAM: PORTABLE CHEST 1 VIEW COMPARISON:  Chest radiograph performed 07/27/2012 FINDINGS: The lungs are mildly hypoexpanded. Mild bibasilar opacities may reflect atelectasis or possibly mild infection. Calcified granulomata are noted at the left lung. No pleural effusion or pneumothorax is seen. The cardiomediastinal silhouette is within normal limits. No acute osseous abnormalities are seen. IMPRESSION: Lungs mildly hypoexpanded. Mild bibasilar opacities may reflect atelectasis or possibly mild infection, depending on the patient's symptoms. Electronically Signed   By: Garald Balding M.D.   On: 03/15/2016 20:14   Ct Orbitss W/o Cm  Result Date: 03/15/2016 CLINICAL DATA:  Fall.  Right orbital swelling EXAM: CT HEAD AND ORBITS WITHOUT CONTRAST TECHNIQUE: Contiguous axial images were obtained from the base of the skull through the vertex without contrast. Multidetector CT imaging of the orbits was performed using the standard protocol without intravenous contrast. COMPARISON:  CT head 02/17/2016 FINDINGS: CT HEAD FINDINGS Brain: Image quality degraded by motion. Allowing for this, no acute intracranial abnormality. Mild atrophy. No acute infarct, hemorrhage, or mass lesion. Vascular: No hyperdense vessel or unexpected calcification. Skull: Negative for skull fracture. Right facial fractures as described below. Sinuses/Orbits: Air-fluid level right maxillary sinus. Other: Soft tissue swelling lateral to the right orbit. CT ORBITS FINDINGS Multiple fractures involving the right face and  orbit. Nondisplaced fracture of the right orbital roof. Nondisplaced fracture right orbital floor. Slightly displaced fracture right lateral orbit. Mildly displaced fracture right zygomatic arch. Mildly displaced fracture right anterior and lateral wall of the maxillary sinus. The entire maxillary sinus was not included on the study. Air-fluid level right maxillary sinus compatible with blood. No fracture of the pterygoid plates. No fracture in the left face. Mandible incompletely imaged on this study. Soft tissue swelling anterior and lateral to the right orbit. No edema within the orbit. IMPRESSION: No acute intracranial abnormality.  CT head degraded by motion Multiple fractures in the right face and orbit as described above. Air-fluid level right maxillary sinus compatible with blood. Electronically Signed   By: Franchot Gallo M.D.   On: 03/15/2016 14:53    Microbiology: Recent Results (from the past 240 hour(s))  Urine culture     Status: Abnormal   Collection Time: 03/15/16  8:30 PM  Result Value Ref Range Status   Specimen Description URINE, CATHETERIZED  Final   Special Requests  NONE  Final   Culture MULTIPLE SPECIES PRESENT, SUGGEST RECOLLECTION (A)  Final   Report Status 03/17/2016 FINAL  Final  MRSA PCR Screening     Status: None   Collection Time: 03/15/16  9:30 PM  Result Value Ref Range Status   MRSA by PCR NEGATIVE NEGATIVE Final    Comment:        The GeneXpert MRSA Assay (FDA approved for NASAL specimens only), is one component of a comprehensive MRSA colonization surveillance program. It is not intended to diagnose MRSA infection nor to guide or monitor treatment for MRSA infections.      Labs: Basic Metabolic Panel:  Recent Labs Lab 03/15/16 1549 03/16/16 0010 03/16/16 0526 03/17/16 0409  NA 137  --  137 138  K 3.5  --  3.3* 3.7  CL 103  --  105 107  CO2 24  --  28 25  GLUCOSE 96  --  111* 93  BUN 14  --  10 15  CREATININE 1.07  --  0.95 1.11  CALCIUM  9.0  --  8.8* 8.9  MG  --  2.1  --   --    Liver Function Tests:  Recent Labs Lab 03/16/16 0526 03/17/16 0409  AST 29 23  ALT 30 25  ALKPHOS 72 69  BILITOT 1.0 0.8  PROT 6.4* 6.3*  ALBUMIN 3.8 3.6   No results for input(s): LIPASE, AMYLASE in the last 168 hours. No results for input(s): AMMONIA in the last 168 hours. CBC:  Recent Labs Lab 03/15/16 1549 03/16/16 0526 03/17/16 0409  WBC 11.0* 8.1 7.3  HGB 15.0 14.6 14.2  HCT 45.4 45.0 43.8  MCV 83.2 83.8 84.2  PLT 211 192 183   Cardiac Enzymes:  Recent Labs Lab 03/15/16 1549  TROPONINI <0.03   BNP: BNP (last 3 results) No results for input(s): BNP in the last 8760 hours.  ProBNP (last 3 results) No results for input(s): PROBNP in the last 8760 hours.  CBG:  Recent Labs Lab 03/17/16 0757  GLUCAP 94       Signed:  Keva Darty MD.  [facility] 03/17/2016, 12:29 PM

## 2016-03-17 NOTE — Progress Notes (Signed)
Pt at this time will only take 200 mg (2 tablets) of the prescribed 300 mg (3 tablets) of dilantin. After much education from this RN pt states "too much medication makes me sick". Pt refuses to take third tablet.

## 2016-03-24 ENCOUNTER — Ambulatory Visit (INDEPENDENT_AMBULATORY_CARE_PROVIDER_SITE_OTHER): Payer: Medicare Other | Admitting: Otolaryngology

## 2016-03-24 DIAGNOSIS — S0231XA Fracture of orbital floor, right side, initial encounter for closed fracture: Secondary | ICD-10-CM | POA: Diagnosis not present

## 2016-03-24 DIAGNOSIS — H6122 Impacted cerumen, left ear: Secondary | ICD-10-CM | POA: Diagnosis not present

## 2016-03-24 DIAGNOSIS — D487 Neoplasm of uncertain behavior of other specified sites: Secondary | ICD-10-CM | POA: Diagnosis not present

## 2016-03-25 ENCOUNTER — Other Ambulatory Visit (INDEPENDENT_AMBULATORY_CARE_PROVIDER_SITE_OTHER): Payer: Self-pay | Admitting: Otolaryngology

## 2016-03-25 DIAGNOSIS — R221 Localized swelling, mass and lump, neck: Secondary | ICD-10-CM

## 2016-03-30 ENCOUNTER — Encounter: Payer: Self-pay | Admitting: Urology

## 2016-03-30 ENCOUNTER — Ambulatory Visit (INDEPENDENT_AMBULATORY_CARE_PROVIDER_SITE_OTHER): Payer: Medicare Other | Admitting: Urology

## 2016-03-30 VITALS — BP 134/79 | HR 67 | Ht 69.0 in | Wt 188.2 lb

## 2016-03-30 DIAGNOSIS — N138 Other obstructive and reflux uropathy: Secondary | ICD-10-CM

## 2016-03-30 DIAGNOSIS — R339 Retention of urine, unspecified: Secondary | ICD-10-CM | POA: Diagnosis not present

## 2016-03-30 DIAGNOSIS — N401 Enlarged prostate with lower urinary tract symptoms: Secondary | ICD-10-CM

## 2016-03-30 DIAGNOSIS — R3129 Other microscopic hematuria: Secondary | ICD-10-CM

## 2016-03-30 LAB — MICROSCOPIC EXAMINATION
Bacteria, UA: NONE SEEN
Epithelial Cells (non renal): NONE SEEN /hpf (ref 0–10)

## 2016-03-30 LAB — URINALYSIS, COMPLETE
Bilirubin, UA: NEGATIVE
GLUCOSE, UA: NEGATIVE
KETONES UA: NEGATIVE
Leukocytes, UA: NEGATIVE
NITRITE UA: NEGATIVE
PROTEIN UA: NEGATIVE
SPEC GRAV UA: 1.015 (ref 1.005–1.030)
UUROB: 0.2 mg/dL (ref 0.2–1.0)
pH, UA: 7 (ref 5.0–7.5)

## 2016-03-30 LAB — BLADDER SCAN AMB NON-IMAGING: SCAN RESULT: 157

## 2016-03-30 MED ORDER — FINASTERIDE 5 MG PO TABS: 5.0000 mg | ORAL_TABLET | Freq: Every day | ORAL | 11 refills | Status: DC

## 2016-03-30 MED ORDER — TAMSULOSIN HCL 0.4 MG PO CAPS: 0.4000 mg | ORAL_CAPSULE | Freq: Every day | ORAL | 11 refills | Status: DC

## 2016-03-30 NOTE — Progress Notes (Signed)
03/30/2016 10:59 AM   Noah Rose 12/06/1950 [id number]  Referring provider: Idelle Crouch, MD [facility] [place], [place] [address]  Chief Complaint  Patient presents with  . Benign Prostatic Hypertrophy    New Patient    HPI: 65 yo M to establish care fur severe urinary symptoms.    He reports that he has heen having issues for year but has gotten worse over the past 6 months.  He is mostly bothered by urinary frequency and urgency.  He reports hey he also notes that his urine as more yellow than it used to be.  No dysuria or gross hematuria.  He has a weak urinary stream especially at night.    He was started on Flomax 0.4 mg two weeks ago during a hospital admission for seizures.   During that admission, he was noted to have elevated PVRs 250 cc.   He did have a Foley briefly during that admission.  UA/UCx was negative.    UA today with incidental microscopic hematuria, 3-10 RBC/ HPF in absence of infection.  There is also amorphous sediment.    IPSS today terrible 32/ 4.   PVR today 176 cc.   Cr 1.11.  His last HgbA1c was been low, well controlled.    Most recent PSA 1.7 on 08/2015.  No family history of prostate cancer.  This is up from 1.17 in 3/87/16 and 0.8 in 0.8 04/2010.  No recent rectal exam.        IPSS    Row Name 03/30/16 1000         International Prostate Symptom Score   How often have you had the sensation of not emptying your bladder? Almost always     How often have you had to urinate less than every two hours? Almost always     How often have you found you stopped and started again several times when you urinated? Almost always     How often have you found it difficult to postpone urination? Almost always     How often have you had a weak urinary stream? Almost always     How often have you had to strain to start urination? Almost always     How many times did you typically get up at night to urinate? 4 Times     Total IPSS Score 34       Quality of Life due to urinary symptoms   If you were to spend the rest of your life with your urinary condition just the way it is now how would you feel about that? Mostly Disatisfied        Score:  1-7 Mild 8-19 Moderate 20-35 Severe  PMH: Past Medical History:  Diagnosis Date  . Anxiety   . Dizziness   . High blood pressure   . Hypertension   . Multiple allergies   . Seizures ([place])     Surgical History: No past surgical history on file.  Home Medications:    Medication List       Accurate as of 03/30/16 11:59 PM. Always use your most recent med list.          calcium-vitamin D 500-200 MG-UNIT tablet Commonly known as:  OSCAL WITH D Take 1 tablet by mouth daily with breakfast.   clonazePAM 0.5 MG tablet Commonly known as:  KLONOPIN Take 0.5 mg by mouth 2 (two) times daily as needed. For anxiety.   finasteride 5  MG tablet Commonly known as:  PROSCAR Take 1 tablet (5 mg total) by mouth daily.   levocetirizine 5 MG tablet Commonly known as:  XYZAL Take 5 mg by mouth every evening.   lisinopril 20 MG tablet Commonly known as:  PRINIVIL,ZESTRIL Take by mouth.   MAGNESIUM PO Take 1 tablet by mouth daily.   multivitamin with minerals Tabs tablet Take 1 tablet by mouth daily.   omega-3 acid ethyl esters 1 g capsule Commonly known as:  LOVAZA Take 1 g by mouth 2 (two) times daily.   phenytoin 300 MG ER capsule Commonly known as:  DILANTIN Take 1 capsule (300 mg total) by mouth at bedtime.   potassium chloride SA 20 MEQ tablet Commonly known as:  K-DUR,KLOR-CON Take by mouth.   sodium chloride 0.65 % Soln nasal spray Commonly known as:  OCEAN Place 1 spray into both nostrils as needed for congestion.   tamsulosin 0.4 MG Caps capsule Commonly known as:  FLOMAX Take 1 capsule (0.4 mg total) by mouth daily.       Allergies:  Allergies  Allergen Reactions  . Almond Oil Shortness Of Breath  . Corn Oil Shortness Of  Breath  . Dextroamphetamine Shortness Of Breath    dizziness  . Dust Mite Mixed Allergen Ext [Mite (D. Farinae)] Shortness Of Breath  . Salicylates Shortness Of Breath and Other (See Comments)    Other reaction(s): Other (See Comments), Syncope  . Amlodipine     Other reaction(s): Other (See Comments) Other Reaction: PATIENT STATES HE CANNOT TOLER dizziness  . Amphetamines     dizziness  . Aspirin     Other reaction(s): Unknown dizziness  . Beet [Beta Vulgaris]   . Cantaloupe (Diagnostic)   . Carrot [Daucus Carota]   . Cheese     American cheddar  . Corn-Containing Products   . Felodipine     Other reaction(s): Other (See Comments) Other Reaction: PATIENT STATES HE CANNOT TOLER dizziness  . Fructose   . Green Dyes     #3   . Indapamide     Other reaction(s): Other (See Comments) Other Reaction: PATIENT STATES HE CANNOT TOLER dizziness  . Iodinated Diagnostic Agents Other (See Comments)    Other reaction(s): Unknown Uncoded Allergy. Allergen: Preservatives, Other Reaction: HTN CRISIS Uncoded Allergy. Allergen: ANTICONVULSANTS Uncoded Allergy. Allergen: Preservatives, Other Reaction: HTN CRISIS Uncoded Allergy. Allergen: ANTICONVULSANTS  . Lambs Quarters     lamb  . Milk-Related Compounds   . Madelaine Bhat Isothiocyanate]   . Other Other (See Comments)    Other reaction(s): Unknown Uncoded Allergy. Allergen: ANTICONVULSANTS dizziness Other reaction(s): Unknown Uncoded Allergy. Allergen: ALCOHOL COUGH MEDICINE Other reaction(s): Unknown Numerous food allergies A lot of preservatives and medicine ingredients  . Pineapple   . Plum Pulp   . Raspberry   . Red Dye     #40  . Salicylic Acid     Other reaction(s): Other (See Comments) Many issues  . Scallops [Shellfish Allergy]   . Tea   . Vanilla   . Whey     Family History: Family History  Problem Relation Age of Onset  . Dementia Mother   . Heart disease Father   . Parkinson's disease Sister   .  Diabetes Sister   . Pancreatic cancer Sister   . Leukemia Sister     Social History:  reports that he has never smoked. He has never used smokeless tobacco. He reports that he does not drink alcohol or use drugs.  ROS:  UROLOGY Frequent Urination?: Yes Hard to postpone urination?: Yes Burning/pain with urination?: No Get up at night to urinate?: Yes Leakage of urine?: Yes Urine stream starts and stops?: No Trouble starting stream?: No Do you have to strain to urinate?: No Blood in urine?: No Urinary tract infection?: No Sexually transmitted disease?: No Injury to kidneys or bladder?: No Painful intercourse?: No Weak stream?: No Erection problems?: No Penile pain?: No  Gastrointestinal Nausea?: No Vomiting?: No Indigestion/heartburn?: No Diarrhea?: No Constipation?: No  Constitutional Fever: No Night sweats?: No Weight loss?: No Fatigue?: No  Skin Skin rash/lesions?: No Itching?: No  Eyes Blurred vision?: No Double vision?: No  Ears/Nose/Throat Sore throat?: No Sinus problems?: No  Hematologic/Lymphatic Swollen glands?: Yes Easy bruising?: No  Cardiovascular Leg swelling?: No Chest pain?: No  Respiratory Cough?: Yes Shortness of breath?: Yes  Endocrine Excessive thirst?: No  Musculoskeletal Back pain?: No Joint pain?: No  Neurological Headaches?: No Dizziness?: No  Psychologic Depression?: No Anxiety?: No  Physical Exam: BP 134/79   Pulse 67   Ht 5\' 9"  (1.753 m)   Wt 188 lb 3.2 oz (85.4 kg)   BMI 27.79 kg/m   Constitutional:  Alert and oriented, No acute distress.  Accompanied by wife today. HEENT: [place] AT, moist mucus membranes.  Trachea midline, no masses. Cardiovascular: No clubbing, cyanosis, or edema. Respiratory: Normal respiratory effort, no increased work of breathing. GI: Abdomen is soft, nontender, nondistended, no abdominal masses GU: No CVA tenderness. Normal circumcised phallus. Orthotopic patent meatus. Bilateral  testicles descended, nontender, mildly atrophic bilaterally, no masses. Rectal: Normal sphincter tone.   Enlarged 50+ cc prostate, nontender, no obvious nodules. Skin: No rashes, bruises or suspicious lesions. Lymph: No cervical or inguinal adenopathy. Neurologic: Grossly intact, no focal deficits, moving all 4 extremities. Psychiatric: Flat affect.  Laboratory Data: Lab Results  Component Value Date   WBC 7.3 03/17/2016   HGB 14.2 03/17/2016   HCT 43.8 03/17/2016   MCV 84.2 03/17/2016   PLT 183 03/17/2016    Lab Results  Component Value Date   CREATININE 1.11 03/17/2016   PSA as above  Lab Results  Component Value Date   HGBA1C 5.2 03/16/2016    Urinalysis  UA today is 3-10 red blood cells and amorphous sediment, otherwise negative.  Pertinent Imaging: Results for orders placed or performed in visit on 03/30/16  Microscopic Examination  Result Value Ref Range   WBC, UA 0-5 0 - 5 /hpf   RBC, UA 3-10 (A) 0 - 2 /hpf   Epithelial Cells (non renal) None seen 0 - 10 /hpf   Crystals Present (A) N/A   Crystal Type Amorphous Sediment N/A   Bacteria, UA None seen None seen/Few  Urinalysis, Complete  Result Value Ref Range   Specific Gravity, UA 1.015 1.005 - 1.030   pH, UA 7.0 5.0 - 7.5   Color, UA Yellow Yellow   Appearance Ur Hazy (A) Clear   Leukocytes, UA Negative Negative   Protein, UA Negative Negative/Trace   Glucose, UA Negative Negative   Ketones, UA Negative Negative   RBC, UA Trace (A) Negative   Bilirubin, UA Negative Negative   Urobilinogen, Ur 0.2 0.2 - 1.0 mg/dL   Nitrite, UA Negative Negative   Microscopic Examination See below:   BLADDER SCAN AMB NON-IMAGING  Result Value Ref Range   Scan Result 157      Assessment & Plan:    1. BPH with obstruction/lower urinary tract symptoms Long-standing acute on chronic urinary issues Large  prostate on exam with both obstructive and irritative voiding symptoms Recommend continuation of Flomax (started 2  weeks ago) and adding finasteride for optimization Reassess in 3 months or sooner as needed If no improvement, may consider outlet procedure - Urinalysis, Complete - BLADDER SCAN AMB NON-IMAGING  2. Incomplete bladder emptying Elevated postvoid residuals both in the hospital and here today. Discussed double voiding and timed voiding today We'll reassess in the near future, advised to contact our office if having worsening difficulty emptying  3. Microscopic hematuria Incidental microscopic hematuria today, not appreciated on previous UAs Suspect this may be related to recent Foley catheterization We'll recheck at next visit  Return in about 3 months (around 06/30/2016) for IPSS, PVR./ UA  Hollice Espy, MD  [facility] [address] [place], [place] [address] [phone]

## 2016-04-05 ENCOUNTER — Other Ambulatory Visit: Payer: Self-pay

## 2016-04-07 ENCOUNTER — Encounter: Payer: Self-pay | Admitting: Neurology

## 2016-04-07 ENCOUNTER — Ambulatory Visit (INDEPENDENT_AMBULATORY_CARE_PROVIDER_SITE_OTHER): Payer: Self-pay | Admitting: Neurology

## 2016-04-07 VITALS — BP 153/96 | HR 63 | Ht 69.0 in | Wt 185.8 lb

## 2016-04-07 DIAGNOSIS — R569 Unspecified convulsions: Secondary | ICD-10-CM

## 2016-04-07 NOTE — Progress Notes (Signed)
appt was canceled, patient was seen by neurology at [facility] on the 9th of thsi month 3 days ago. He will follow up with [facility] neurology.

## 2016-04-23 ENCOUNTER — Encounter (HOSPITAL_COMMUNITY): Payer: Self-pay | Admitting: *Deleted

## 2016-04-23 ENCOUNTER — Emergency Department (HOSPITAL_COMMUNITY): Payer: Medicare Other

## 2016-04-23 ENCOUNTER — Observation Stay (HOSPITAL_COMMUNITY)
Admission: EM | Admit: 2016-04-23 | Discharge: 2016-04-24 | Disposition: A | Discharge status: Home / Self Care | Payer: Medicare Other | Attending: Internal Medicine | Admitting: Internal Medicine

## 2016-04-23 DIAGNOSIS — Z886 Allergy status to analgesic agent status: Secondary | ICD-10-CM | POA: Diagnosis not present

## 2016-04-23 DIAGNOSIS — R410 Disorientation, unspecified: Secondary | ICD-10-CM | POA: Diagnosis present

## 2016-04-23 DIAGNOSIS — Z91011 Allergy to milk products: Secondary | ICD-10-CM | POA: Diagnosis not present

## 2016-04-23 DIAGNOSIS — R569 Unspecified convulsions: Secondary | ICD-10-CM | POA: Diagnosis present

## 2016-04-23 DIAGNOSIS — Z91018 Allergy to other foods: Secondary | ICD-10-CM | POA: Insufficient documentation

## 2016-04-23 DIAGNOSIS — R3129 Other microscopic hematuria: Secondary | ICD-10-CM | POA: Diagnosis not present

## 2016-04-23 DIAGNOSIS — Z9114 Patient's other noncompliance with medication regimen: Secondary | ICD-10-CM | POA: Diagnosis not present

## 2016-04-23 DIAGNOSIS — Z87891 Personal history of nicotine dependence: Secondary | ICD-10-CM | POA: Insufficient documentation

## 2016-04-23 DIAGNOSIS — Z820 Family history of epilepsy and other diseases of the nervous system: Secondary | ICD-10-CM | POA: Insufficient documentation

## 2016-04-23 DIAGNOSIS — Z91013 Allergy to seafood: Secondary | ICD-10-CM | POA: Diagnosis not present

## 2016-04-23 DIAGNOSIS — I10 Essential (primary) hypertension: Secondary | ICD-10-CM | POA: Diagnosis present

## 2016-04-23 DIAGNOSIS — F419 Anxiety disorder, unspecified: Secondary | ICD-10-CM | POA: Insufficient documentation

## 2016-04-23 DIAGNOSIS — G40909 Epilepsy, unspecified, not intractable, without status epilepticus: Principal | ICD-10-CM | POA: Insufficient documentation

## 2016-04-23 DIAGNOSIS — Z888 Allergy status to other drugs, medicaments and biological substances status: Secondary | ICD-10-CM | POA: Diagnosis not present

## 2016-04-23 DIAGNOSIS — R5383 Other fatigue: Secondary | ICD-10-CM | POA: Diagnosis not present

## 2016-04-23 DIAGNOSIS — Z833 Family history of diabetes mellitus: Secondary | ICD-10-CM | POA: Insufficient documentation

## 2016-04-23 DIAGNOSIS — E871 Hypo-osmolality and hyponatremia: Secondary | ICD-10-CM | POA: Diagnosis present

## 2016-04-23 DIAGNOSIS — Z79899 Other long term (current) drug therapy: Secondary | ICD-10-CM | POA: Diagnosis not present

## 2016-04-23 DIAGNOSIS — Z800 Family history of malignant neoplasm of digestive organs: Secondary | ICD-10-CM | POA: Diagnosis not present

## 2016-04-23 DIAGNOSIS — Z806 Family history of leukemia: Secondary | ICD-10-CM | POA: Insufficient documentation

## 2016-04-23 DIAGNOSIS — G4733 Obstructive sleep apnea (adult) (pediatric): Secondary | ICD-10-CM | POA: Diagnosis present

## 2016-04-23 DIAGNOSIS — N400 Enlarged prostate without lower urinary tract symptoms: Secondary | ICD-10-CM | POA: Diagnosis not present

## 2016-04-23 DIAGNOSIS — Z8249 Family history of ischemic heart disease and other diseases of the circulatory system: Secondary | ICD-10-CM | POA: Insufficient documentation

## 2016-04-23 LAB — CBC WITH DIFFERENTIAL/PLATELET
BASOS ABS: 0 10*3/uL (ref 0.0–0.1)
BASOS PCT: 1 %
EOS PCT: 1 %
Eosinophils Absolute: 0.1 10*3/uL (ref 0.0–0.7)
HEMATOCRIT: 42.5 % (ref 39.0–52.0)
Hemoglobin: 14.2 g/dL (ref 13.0–17.0)
LYMPHS PCT: 13 %
Lymphs Abs: 1.1 10*3/uL (ref 0.7–4.0)
MCH: 27.3 pg (ref 26.0–34.0)
MCHC: 33.4 g/dL (ref 30.0–36.0)
MCV: 81.7 fL (ref 78.0–100.0)
MONO ABS: 0.5 10*3/uL (ref 0.1–1.0)
Monocytes Relative: 6 %
NEUTROS ABS: 6.5 10*3/uL (ref 1.7–7.7)
Neutrophils Relative %: 79 %
PLATELETS: 164 10*3/uL (ref 150–400)
RBC: 5.2 MIL/uL (ref 4.22–5.81)
RDW: 13.5 % (ref 11.5–15.5)
WBC: 8.3 10*3/uL (ref 4.0–10.5)

## 2016-04-23 LAB — URINE MICROSCOPIC-ADD ON: SQUAMOUS EPITHELIAL / LPF: NONE SEEN

## 2016-04-23 LAB — BASIC METABOLIC PANEL
ANION GAP: 8 (ref 5–15)
BUN: 25 mg/dL — ABNORMAL HIGH (ref 6–20)
CALCIUM: 8.6 mg/dL — AB (ref 8.9–10.3)
CO2: 23 mmol/L (ref 22–32)
Chloride: 100 mmol/L — ABNORMAL LOW (ref 101–111)
Creatinine, Ser: 1.03 mg/dL (ref 0.61–1.24)
GLUCOSE: 105 mg/dL — AB (ref 65–99)
POTASSIUM: 4.2 mmol/L (ref 3.5–5.1)
Sodium: 131 mmol/L — ABNORMAL LOW (ref 135–145)

## 2016-04-23 LAB — URINALYSIS, ROUTINE W REFLEX MICROSCOPIC
Bilirubin Urine: NEGATIVE
Glucose, UA: NEGATIVE mg/dL
KETONES UR: NEGATIVE mg/dL
LEUKOCYTES UA: NEGATIVE
NITRITE: NEGATIVE
PROTEIN: NEGATIVE mg/dL
Specific Gravity, Urine: 1.012 (ref 1.005–1.030)
pH: 8 (ref 5.0–8.0)

## 2016-04-23 LAB — PHENYTOIN LEVEL, TOTAL

## 2016-04-23 MED ORDER — SODIUM CHLORIDE 0.9 % IV SOLN: 400.0000 mg | Freq: Once | INTRAVENOUS | Status: DC

## 2016-04-23 MED ORDER — LORAZEPAM 2 MG/ML IJ SOLN
1.0000 mg | Freq: Once | INTRAMUSCULAR | Status: AC
  Administered 2016-04-23: 1 mg via INTRAVENOUS

## 2016-04-23 MED ORDER — SODIUM CHLORIDE 0.9 % IV SOLN
1250.0000 mg | Freq: Once | INTRAVENOUS | Status: AC
  Administered 2016-04-23: 1250 mg via INTRAVENOUS
  Filled 2016-04-23: qty 25

## 2016-04-23 MED ORDER — LORAZEPAM 2 MG/ML IJ SOLN
INTRAMUSCULAR | Status: AC
  Filled 2016-04-23: qty 1

## 2016-04-23 NOTE — H&P (Signed)
History and Physical  Patient Name: Noah Rose     [id number]    DOB: Oct 14, 1950    DOA: 04/23/2016 PCP: Idelle Crouch, MD  Primary Neurologist: Encarnacion Slates, MD      Patient coming from: Home  Chief Complaint: Seizures  HPI: Noah Rose is a 65 y.o. male with a past medical history significant for epilepsy on Dilantin, HTN, and OSA not on CPAP who presents with seizures.  Caveat that patient is sedated and unable to provide history, all history collected from wife at bedside.  She believes he stopped taking his Dilantin 10 days ago.  Evidently, 10 days ago Friday he was very drowsy, nodding off all day.  The next day, she thinks he stopped his Dilantin.  Monday and Tuesday of this past week to breakthrough seizures (his typical seizure is a staring episode, accompanied by left arm flexion and unresponsiveness). They called his new neurologist who recommended he stay on Dilantin and schedule closer follow-up appointment to discuss medication adjustments.  Then today, the patient had 4 breakthrough seizures, so his wife brought him to the ER.  ED course: -Temp 90 9.61F, heart rate 90s, respirations 20, blood pressure 192/113, pulse oximetry normal -Na 131, K 4.2, Cr 1.03, WBC 8.3K, Hgb 14.2, UA with mild hematuria -He was given clonazepam and lorazepam before EMS arrival and/or by EMS -He was given Dilantin IV load here -Neurology were called who recommended overnight observation until the patient is able to discuss his home AED regimen and any changes    The patient was admitted in September 2017 for breakthrough seizures.  At the time he was taking only clonazepam PRN for seizures (per wife, for several years he was without maintenance AED, but would take clonazepam BID during weeks when he was having more spells, however, she noticed before his admission that he was having more and more frequent spells).  He was started on Dilantin and it was since then (in fact, two  weeks ago) that he established care with Dr. Dellis Filbert at [facility].    ROS: Review of Systems  Unable to perform ROS: Patient unresponsive          Past Medical History:  Diagnosis Date  . Anxiety   . Dizziness   . High blood pressure   . Hypertension   . Multiple allergies   . Seizures ([place])     History reviewed. No pertinent surgical history.  Social History: Patient lives with his wife.  The patient walks unassisted.  He drives less and his wife has started to take over finances.  He has a minimal remote smoking history.  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  He is from [facility].     Allergies  Allergen Reactions  . Almond Oil Shortness Of Breath  . Corn Oil Shortness Of Breath  . Dextroamphetamine Shortness Of Breath    dizziness  . Dust Mite Mixed Allergen Ext [Mite (D. Farinae)] Shortness Of Breath  . Salicylates Shortness Of Breath and Other (See Comments)    Other reaction(s): Other (See Comments), Syncope  . Amlodipine     Other reaction(s): Other (See Comments) Other Reaction: PATIENT STATES HE CANNOT TOLER dizziness  . Amphetamines     dizziness  . Aspirin     Other reaction(s): Unknown dizziness  . Beet [Beta Vulgaris]   . Cantaloupe (Diagnostic)   . Carrot [Daucus Carota]   . Cheese     American cheddar  .  Corn-Containing Products   . Felodipine     Other reaction(s): Other (See Comments) Other Reaction: PATIENT STATES HE CANNOT TOLER dizziness  . Fructose   . Green Dyes     #3   . Indapamide     Other reaction(s): Other (See Comments) Other Reaction: PATIENT STATES HE CANNOT TOLER dizziness  . Iodinated Diagnostic Agents Other (See Comments)    Other reaction(s): Unknown Uncoded Allergy. Allergen: Preservatives, Other Reaction: HTN CRISIS Uncoded Allergy. Allergen: ANTICONVULSANTS Uncoded Allergy. Allergen: Preservatives, Other Reaction: HTN CRISIS Uncoded Allergy. Allergen: ANTICONVULSANTS  . Lambs Quarters     lamb  .  Milk-Related Compounds   . Madelaine Bhat Isothiocyanate]   . Other Other (See Comments)    Other reaction(s): Unknown Uncoded Allergy. Allergen: ANTICONVULSANTS dizziness Other reaction(s): Unknown Uncoded Allergy. Allergen: ALCOHOL COUGH MEDICINE Other reaction(s): Unknown Numerous food allergies A lot of preservatives and medicine ingredients  . Pineapple   . Plum Pulp   . Raspberry   . Red Dye     #40  . Salicylic Acid     Other reaction(s): Other (See Comments) Many issues  . Scallops [Shellfish Allergy]   . Tea   . Vanilla   . Whey     Family history:family history includes Dementia in his mother; Diabetes in his sister; Heart disease in his father; Leukemia in his sister; Pancreatic cancer in his sister; Parkinson's disease in his sister.  Prior to Admission medications   Medication Sig Start Date End Date Taking? Authorizing Provider  clonazePAM (KLONOPIN) 0.5 MG tablet Take 0.5 mg by mouth 2 (two) times daily as needed. For anxiety.    Yes Historical Provider, MD  cloNIDine (CATAPRES) 0.1 MG tablet Take 0.1 mg by mouth 2 (two) times daily.   Yes Historical Provider, MD  finasteride (PROSCAR) 5 MG tablet Take 1 tablet (5 mg total) by mouth daily. 03/30/16  Yes Hollice Espy, MD  lisinopril (PRINIVIL,ZESTRIL) 20 MG tablet Take by mouth. 03/01/16  Yes Historical Provider, MD  phenytoin (DILANTIN) 300 MG ER capsule Take 1 capsule (300 mg total) by mouth at bedtime. 03/17/16  Yes Eugenie Filler, MD  tamsulosin (FLOMAX) 0.4 MG CAPS capsule Take 1 capsule (0.4 mg total) by mouth daily. 03/30/16  Yes Hollice Espy, MD       Physical Exam: BP 138/91   Pulse 94   Temp 99.6 F (37.6 C) (Oral)   Resp 14   Wt 83.9 kg (185 lb)   SpO2 100%   BMI 27.32 kg/m  General appearance: Well-developed, adult male, sedated, withdraws from noxious stimuli, does not rouse or make purposeful movements.  Eyes: Anicteric, conjunctiva pink, lids and lashes normal. Pupils equal, left pupil  reactive, will not open right eye enough to check reflexes.    ENT: No nasal deformity, discharge, epistaxis. OP moist without lesions.   Neck: No neck masses.  Trachea midline.  No thyromegaly/tenderness. Lymph: No cervical or supraclavicular lymphadenopathy. Skin: Warm and dry.  No jaundice.  No suspicious rashes or lesions. Cardiac: RRR, nl S1-S2, no murmurs appreciated.  Capillary refill is brisk.  JVP normal.  No LE edema.  Radial and DP pulses 2+ and symmetric. Respiratory: Normal respiratory rate and rhythm.  CTAB without rales or wheezes. Abdomen: Abdomen soft.  No ascites, distension, hepatosplenomegaly.   MSK: No deformities or effusions.  No cyanosis or clubbing. Neuro: Sedated.  Strength seems normal.  Does not follow commands. Psych: Unable to asses.     Labs on Admission:  I  have personally reviewed following labs and imaging studies: CBC:  Recent Labs Lab 04/23/16 1942  WBC 8.3  NEUTROABS 6.5  HGB 14.2  HCT 42.5  MCV 81.7  PLT 123456   Basic Metabolic Panel:  Recent Labs Lab 04/23/16 1942  NA 131*  K 4.2  CL 100*  CO2 23  GLUCOSE 105*  BUN 25*  CREATININE 1.03  CALCIUM 8.6*   GFR: Estimated Creatinine Clearance: 69.6 mL/min (by C-G formula based on SCr of 1.03 mg/dL).  Liver Function Tests: No results for input(s): AST, ALT, ALKPHOS, BILITOT, PROT, ALBUMIN in the last 168 hours. No results for input(s): LIPASE, AMYLASE in the last 168 hours. No results for input(s): AMMONIA in the last 168 hours. Coagulation Profile: No results for input(s): INR, PROTIME in the last 168 hours. Cardiac Enzymes: No results for input(s): CKTOTAL, CKMB, CKMBINDEX, TROPONINI in the last 168 hours. BNP (last 3 results) No results for input(s): PROBNP in the last 8760 hours. HbA1C: No results for input(s): HGBA1C in the last 72 hours. CBG: No results for input(s): GLUCAP in the last 168 hours. Lipid Profile: No results for input(s): CHOL, HDL, LDLCALC, TRIG, CHOLHDL,  LDLDIRECT in the last 72 hours. Thyroid Function Tests: No results for input(s): TSH, T4TOTAL, FREET4, T3FREE, THYROIDAB in the last 72 hours. Anemia Panel: No results for input(s): VITAMINB12, FOLATE, FERRITIN, TIBC, IRON, RETICCTPCT in the last 72 hours. Sepsis Labs: Invalid input(s): PROCALCITONIN, LACTICIDVEN No results found for this or any previous visit (from the past 240 hour(s)).       Radiological Exams on Admission: Personally reviewed: Ct Head Wo Contrast  Result Date: 04/23/2016 CLINICAL DATA:  65 year old male with altered mental status and seizure like activity. EXAM: CT HEAD WITHOUT CONTRAST TECHNIQUE: Contiguous axial images were obtained from the base of the skull through the vertex without intravenous contrast. COMPARISON:  Brain MRI dated 03/16/2016 and CT dated 03/14/2016 FINDINGS: Brain: No evidence of acute infarction, hemorrhage, hydrocephalus, extra-axial collection or mass lesion/mass effect. Vascular: No hyperdense vessel or unexpected calcification. Skull: Normal. Negative for fracture or focal lesion. Sinuses/Orbits: Mild mucoperiosteal thickening of paranasal sinuses. No air-fluid levels. The mastoid air cells are clear. The globes and retro-orbital fat are preserved. Other: None IMPRESSION: No acute intracranial pathology. Electronically Signed   By: Anner Crete M.D.   On: 04/23/2016 21:15    EKG: Independently reviewed. Rate 100, QTc 460, sinus tachycardia, no ST changes.           Assessment/Plan  1. Seizure:  Dilantin loaded in ER.  Neurology will discuss possible adjustments to his AED regimen when he is more awake tomorrow.  There is a question by his wife if he has cognitive impairment or early dementia and that this interferes with his medications, and she would like this conveyed to his primary neurologist if possible. -Continue Dilantin ER 300 nightly tomorrow   -Consult to Neurology, appreciate cares -Lorazepam PRN for  seizures -Seizure precautions     2. Hyponatremia:  -Check UNa, uOsm, sOsm -Fluids overnight -Repeat BMP tomorrow  3. OSA:  Does not tolerate CPAP  4. HTN:  -Continue lisinopril and clonidine  5. Microscopic hematuria:  Incidental finding.  Was noted earlier this month at Urology appt, and per their note, will be followed up at his next appt in 3 months. -Follow up with Urologist in 2 months            DVT prophylaxis: Lovenox  Code Status: FULL  Family Communication: Wife at bedside  Disposition Plan: Anticipate OBS overnight until more awake and able to discuss AED regimen with Neurology, then discharge to home with Neuro follow up. Consults called: Neuro Admission status: OBS, med surg At the point of initial evaluation, it is my clinical opinion that admission for OBSERVATION is reasonable and necessary because the patient's presenting complaints in the context of their chronic conditions represent sufficient risk of deterioration or significant morbidity to constitute reasonable grounds for close observation in the hospital setting, but that the patient may be medically stable for discharge from the hospital within 24 to 48 hours.    Medical decision making: Patient seen at 10:40 PM on 04/23/2016.  The patient was discussed with Dr. Ellene Route and Dr. Leonel Ramsay.  What exists of the patient's chart was reviewed in depth and outside records from [facility] were requested and summarized above.  Clinical condition: stable.        Edwin Dada [facility] Pager [phone]

## 2016-04-23 NOTE — ED Triage Notes (Signed)
Wife states he has not been taking his seizure meds because it makes him feel weak.  Today had several postural seizures and did not return to normal baseline for some time

## 2016-04-23 NOTE — ED Provider Notes (Signed)
[facility] DEPT Provider Note   CSN: [id number] Arrival date & time: 04/23/16  1912     History   Chief Complaint Chief Complaint  Patient presents with  . Seizures    HPI Noah Rose is a 65 y.o. male.  Patient presents from home after three partial seizures today at [facility], 3:30PM, and 5:45PM that were similar to his prior seizures. Wife reports he has not been taking his dilantin the last few days as he felt it was making him "feel worse". No recent head injuries. No recent fevers or cough. Has reportedly tired multiple seizure medications in the past and had poor tolerance to all of them. Wife reports he has not returned to baseline after his seizures today and has been "incoherent". She witnessed all seizures.   The history is provided by the spouse. The history is limited by the condition of the patient. No language interpreter was used.  Seizures   This is a chronic problem. The current episode started 3 to 5 hours ago. The problem has not changed since onset.There were 2 to 3 seizures. The most recent episode lasted 2 to 5 minutes. Associated symptoms include confusion. Pertinent negatives include no headaches, no speech difficulty, no chest pain, no vomiting, no diarrhea and no muscle weakness. Characteristics include bladder incontinence and rhythmic jerking. The episode was witnessed. The seizures did not continue in the ED. Possible causes include missed seizure meds. There has been no fever. There were no medications administered prior to arrival.    Past Medical History:  Diagnosis Date  . Anxiety   . Dizziness   . High blood pressure   . Hypertension   . Multiple allergies   . Seizures [facility])     Patient Active Problem List   Diagnosis Date Noted  . Hyponatremia 04/23/2016  . Microscopic hematuria 04/23/2016  . Urinary retention: Probable 03/17/2016  . Sinusitis, acute, maxillary: Right 03/16/2016  . Seizures ([place]) 03/16/2016  . Facial fracture ([place])  03/15/2016  . Leukocytosis 03/15/2016  . Hypertensive urgency   . Orbital fracture ([place])   . Seizure ([place])   . Syncope 07/28/2012  . Essential hypertension 07/28/2012  . OSA (obstructive sleep apnea) 07/28/2012  . Chronic allergic rhinitis 07/28/2012  . Chronic anxiety 07/28/2012  . 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 No pertinent surgical history.     Home Medications    Prior to Admission medications   Medication Sig Start Date End Date Taking? Authorizing Provider  clonazePAM (KLONOPIN) 0.5 MG tablet Take 0.5 mg by mouth 2 (two) times daily as needed. For anxiety.    Yes Historical Provider, MD  cloNIDine (CATAPRES) 0.1 MG tablet Take 0.1 mg by mouth 2 (two) times daily.   Yes Historical Provider, MD  finasteride (PROSCAR) 5 MG tablet Take 1 tablet (5 mg total) by mouth daily. 03/30/16  Yes Hollice Espy, MD  lisinopril (PRINIVIL,ZESTRIL) 20 MG tablet Take by mouth. 03/01/16  Yes Historical Provider, MD  phenytoin (DILANTIN) 300 MG ER capsule Take 1 capsule (300 mg total) by mouth at bedtime. 03/17/16  Yes Eugenie Filler, MD  tamsulosin (FLOMAX) 0.4 MG CAPS capsule Take 1 capsule (0.4 mg total) by mouth daily. 03/30/16  Yes Hollice Espy, MD    Family History Family History  Problem Relation Age of Onset  . Dementia Mother   . Heart disease Father   . Parkinson's disease Sister   . Diabetes Sister   . Pancreatic cancer Sister   . Leukemia Sister  Social History Social History  Substance Use Topics  . Smoking status: Never Smoker  . Smokeless tobacco: Never Used  . Alcohol use No     Allergies   Almond oil; Corn oil; Dextroamphetamine; Dust mite mixed allergen ext [mite (d. farinae)]; Salicylates; Amlodipine; Amphetamines; Aspirin; Beet [beta vulgaris]; Cantaloupe (diagnostic); Carrot [daucus carota]; Cheese; Corn-containing products; Felodipine; Fructose; Green dyes; Indapamide; Iodinated diagnostic agents; Lambs quarters; Milk-related compounds;  Mustard [allyl isothiocyanate]; Other; Pineapple; Plum pulp; Raspberry; Red dye; Salicylic acid; Scallops [shellfish allergy]; Tea; Vanilla; and Whey   Review of Systems Review of Systems  Constitutional: Negative for fever.  HENT: Positive for congestion.   Respiratory: Negative for shortness of breath.   Cardiovascular: Negative for chest pain.  Gastrointestinal: Negative for diarrhea and vomiting.  Genitourinary: Positive for bladder incontinence. Negative for difficulty urinating.  Musculoskeletal: Negative.   Skin: Negative.   Allergic/Immunologic: Negative for immunocompromised state.  Neurological: Positive for seizures. Negative for speech difficulty and headaches.  Hematological: Does not bruise/bleed easily.  Psychiatric/Behavioral: Positive for confusion.     Physical Exam Updated Vital Signs BP (!) 135/104   Pulse 86   Temp 99.6 F (37.6 C) (Oral)   Resp 15   Wt 83.9 kg   SpO2 99%   BMI 27.32 kg/m   Physical Exam  Constitutional: He appears well-developed and well-nourished. No distress.  HENT:  Head: Normocephalic and atraumatic.  Mouth/Throat: Oropharynx is clear and moist.  Eyes: Conjunctivae and EOM are normal. Pupils are equal, round, and reactive to light. Right eye exhibits no discharge. Left eye exhibits no discharge. No scleral icterus.  Neck: Normal range of motion. Neck supple.  Cardiovascular: Regular rhythm, S1 normal, S2 normal, normal heart sounds and intact distal pulses.  Tachycardia present.   Pulmonary/Chest: Effort normal and breath sounds normal. No respiratory distress. He has no wheezes. He has no rales.  Abdominal: Soft. He exhibits no distension and no mass. There is no tenderness. There is no rebound and no guarding.  Musculoskeletal: He exhibits no edema.  Neurological: He is alert.  Alert, oriented to person and place but not to time or event. CN II-XII intact without facial droop, no aphasia or dysarthria. 5/5 strength of shoulder  and elbow flexion/extension bilaterally, normal grip strength bilaterally. 5/5 strength of hip flexion/extension and dorsiflexion/plarflexion bilaterally. Sensation intact in all extremities.  Skin: Skin is warm and dry. No rash noted. He is not diaphoretic. No pallor.     ED Treatments / Results  Labs (all labs ordered are listed, but only abnormal results are displayed) Labs Reviewed  BASIC METABOLIC PANEL - Abnormal; Notable for the following:       Result Value   Sodium 131 (*)    Chloride 100 (*)    Glucose, Bld 105 (*)    BUN 25 (*)    Calcium 8.6 (*)    All other components within normal limits  URINALYSIS, ROUTINE W REFLEX MICROSCOPIC (NOT AT [facility]) - Abnormal; Notable for the following:    Hgb urine dipstick MODERATE (*)    All other components within normal limits  PHENYTOIN LEVEL, TOTAL - Abnormal; Notable for the following:    Phenytoin Lvl <2.5 (*)    All other components within normal limits  URINE MICROSCOPIC-ADD ON - Abnormal; Notable for the following:    Bacteria, UA RARE (*)    All other components within normal limits  URINE CULTURE  CBC WITH DIFFERENTIAL/PLATELET    EKG  EKG Interpretation  Date/Time:  Saturday  April 23 2016 19:47:58 EDT Ventricular Rate:  100 PR Interval:    QRS Duration: 99 QT Interval:  356 QTC Calculation: 460 R Axis:   41 Text Interpretation:  Sinus tachycardia Probable left atrial enlargement Benign early repolarization Since last tracing rate faster Otherwise no significant change Confirmed by KNOTT MD, DANIEL [id number]) on 04/23/2016 7:54:01 PM       Radiology Ct Head Wo Contrast  Result Date: 04/23/2016 CLINICAL DATA:  65 year old male with altered mental status and seizure like activity. EXAM: CT HEAD WITHOUT CONTRAST TECHNIQUE: Contiguous axial images were obtained from the base of the skull through the vertex without intravenous contrast. COMPARISON:  Brain MRI dated 03/16/2016 and CT dated 03/14/2016 FINDINGS: Brain: No  evidence of acute infarction, hemorrhage, hydrocephalus, extra-axial collection or mass lesion/mass effect. Vascular: No hyperdense vessel or unexpected calcification. Skull: Normal. Negative for fracture or focal lesion. Sinuses/Orbits: Mild mucoperiosteal thickening of paranasal sinuses. No air-fluid levels. The mastoid air cells are clear. The globes and retro-orbital fat are preserved. Other: None IMPRESSION: No acute intracranial pathology. Electronically Signed   By: Anner Crete M.D.   On: 04/23/2016 21:15    Procedures Procedures (including critical care time)  Medications Ordered in ED Medications  LORazepam (ATIVAN) injection 1 mg (1 mg Intravenous Given 04/23/16 2016)  phenytoin (DILANTIN) 1,250 mg in sodium chloride 0.9 % 250 mL IVPB (0 mg Intravenous Stopped 04/23/16 2238)     Initial Impression / Assessment and Plan / ED Course  I have reviewed the triage vital signs and the nursing notes.  Pertinent labs & imaging results that were available during my care of the patient were reviewed by me and considered in my medical decision making (see chart for details).  Clinical Course    Patient presents with concern for status epilepticus versus seizure cluster in the setting of four days' medication noncompliance with dilantin. Wife reports 3 partial seizures today consistent with typical seizure episodes, lasting 2-3 minutes each. Has been disoriented between episodes and confused, has not returned to baseline. Was alert and oriented x 2 on arrival, following commands. Had witnessed partial seizure in the ED of around 30 seconds. Received 1 mg of ativan and was loaded with dilantin for an undetectable level. Labs and CT head otherwise unremarkable without signs of any intracranial lesion, no history of head trauma, no recent infection. Neurology was consulted who recommended admission for medication management. He will be admitted to hospitalist service for further management. He is  stable and appropriate for floor admission.  Final Clinical Impressions(s) / ED Diagnoses   Final diagnoses:  Seizure [facility])  Delirium    New Prescriptions New Prescriptions   No medications on file     Harlin Heys, MD 04/23/16 [id number]    Leo Grosser, MD 04/25/16 [phone]

## 2016-04-23 NOTE — Consult Note (Signed)
Neurology Consultation Reason for Consult: Seizures Referring Physician: Laneta Simmers, D  CC: Seizures  History is obtained from: Patient  HPI: Noah Rose is a 65 y.o. male history of seizure disorder currently on Dilantin, the per his wife he has tried others in the past. Per his recent neurologists note( Dr. Lindell Spar in [place]) he has tried Keppra, onfi, Depakote, Vimpat, phenobarbital, Valium and Klonopin, possibly others. He has either not head control or had side effects resulting in discontinuation. He apparently was not liking the way that Dilantin was making him feel and therefore stopped taking it 4 days ago. He has now had multiple breakthrough seizures today, most recently in the ER. He was given Ativan and Klonopin prior to arrival and is currently very sedated and unable to participate in giving any history.   ROS: Unable to obtain due to altered mental status.  Past Medical History:  Diagnosis Date  . Anxiety   . Dizziness   . High blood pressure   . Hypertension   . Multiple allergies   . Seizures ([place])      Family History  Problem Relation Age of Onset  . Dementia Mother   . Heart disease Father   . Parkinson's disease Sister   . Diabetes Sister   . Pancreatic cancer Sister   . Leukemia Sister      Social History:  reports that he has never smoked. He has never used smokeless tobacco. He reports that he does not drink alcohol or use drugs.   Exam: Current vital signs: BP 156/95   Pulse 93   Temp 99.6 F (37.6 C) (Oral)   Resp 18   Wt 83.9 kg (185 lb)   SpO2 98%   BMI 27.32 kg/m  Vital signs in last 24 hours: Temp:  [99.6 F (37.6 C)] 99.6 F (37.6 C) (10/28 1924) Pulse Rate:  [88-105] 93 (10/28 2140) Resp:  [15-22] 18 (10/28 2140) BP: (142-192)/(84-113) 156/95 (10/28 2140) SpO2:  [95 %-100 %] 98 % (10/28 2140) Weight:  [83.9 kg (185 lb)] 83.9 kg (185 lb) (10/28 1925)   Physical Exam  Constitutional: Appears well-developed and  well-nourished.  Psych: Affect appropriate to situation Eyes: No scleral injection HENT: No OP obstrucion Head: Normocephalic.  Cardiovascular: Normal rate and regular rhythm.  Respiratory: Effort normal and breath sounds normal to anterior ascultation GI: Soft.  No distension. There is no tenderness.  Skin: WDI  Neuro: Mental Status: Patient is lethargic, but arousable. He is only partially cooperative with examination. With repeated stimulation he does tell me his name is Noah Rose, and follows commands in all 4 extremities. Cranial Nerves: II: He counts fingers in both hemifields, but is not very cooperative. Pupils are equal, round, and reactive to light.   III,IV, VI: EOMI without ptosis or diploplia.  V: Facial sensation is symmetric to temperature VII: Facial movement is symmetric.  VIII: hearing is intact to voice He does not comply with saying "ah" or sticking out his tongue. Motor: He moves all extremities to command, does cooperate with formal testing in his arms and has 5 out of 5 strength, but does not comply with testing in his legs. He does wiggle toes to command bilaterally. Sensory: He responds to noxious stimuli in all 4 extremities Cerebellar: He does not comply     I have reviewed labs in epic and the results pertinent to this consultation are: Mild hyponatremia  I have reviewed the images obtained: CT head-unremarkable  Impression: 65 year old male with seizures  in the setting of medication noncompliance. He is currently being loaded with dilantin. I think that a lot of his sedation currently is the combination of klonopin he took earlier and ativan he received in the ED.   Recommendations: 1) restart home dilantin 300mg  qhs starting tomorrow 2) would discuss with patient once more awake if he would be willing to try another agent. If not tried previously and sodium normalizes trileptal might be a consideration. Also lamictal is generally well tolerated if he  was willing to take keppra or dilantin (though more difficult due to interactions)as a bridge until theraputic on lamictal.   3) neurology will follow.    Roland Rack, MD [facility] [phone]  If 7pm- 7am, please page neurology on call as listed in [place].

## 2016-04-23 NOTE — ED Notes (Signed)
Patient arrived from home with complaint of 4 seizures today. Wife reports history of the same with poor compliance in taking Dilantin at home. Recently patient stopped taking medication because "he doesn't like the way it makes him feel". Upon arrival here patient had seizure again in triage. Was transported to this RNs care, and subsequently had 1 more seizure @ 2010. Lasted approx 1 min. Noted to be tachycardic with diffuse muscle contraction and urinary incontinence. After seizure resolution patient not redirectable for a few minutes and was attempting to get out of bed. Wife at bedside explained that this was typical for his seizures. 1mg  Ativan given at that time. No further seizure activity. Currently patient is obtunded but responsive and able to state name.

## 2016-04-24 ENCOUNTER — Encounter (HOSPITAL_COMMUNITY): Payer: Self-pay

## 2016-04-24 DIAGNOSIS — N400 Enlarged prostate without lower urinary tract symptoms: Secondary | ICD-10-CM

## 2016-04-24 DIAGNOSIS — R410 Disorientation, unspecified: Secondary | ICD-10-CM | POA: Diagnosis not present

## 2016-04-24 DIAGNOSIS — G40909 Epilepsy, unspecified, not intractable, without status epilepticus: Secondary | ICD-10-CM | POA: Diagnosis not present

## 2016-04-24 DIAGNOSIS — R569 Unspecified convulsions: Secondary | ICD-10-CM | POA: Diagnosis not present

## 2016-04-24 DIAGNOSIS — I10 Essential (primary) hypertension: Secondary | ICD-10-CM | POA: Diagnosis not present

## 2016-04-24 LAB — BASIC METABOLIC PANEL
ANION GAP: 7 (ref 5–15)
BUN: 22 mg/dL — ABNORMAL HIGH (ref 6–20)
CALCIUM: 8.6 mg/dL — AB (ref 8.9–10.3)
CO2: 25 mmol/L (ref 22–32)
CREATININE: 0.95 mg/dL (ref 0.61–1.24)
Chloride: 105 mmol/L (ref 101–111)
GFR calc Af Amer: >60 mL/min (ref >60)
GFR calc non Af Amer: >60 mL/min (ref >60)
GLUCOSE: 115 mg/dL — AB (ref 65–99)
Potassium: 3.9 mmol/L (ref 3.5–5.1)
Sodium: 137 mmol/L (ref 135–145)

## 2016-04-24 LAB — URINE CULTURE: CULTURE: NO GROWTH

## 2016-04-24 LAB — OSMOLALITY: OSMOLALITY: 296 mosm/kg — AB (ref 275–295)

## 2016-04-24 LAB — OSMOLALITY, URINE: Osmolality, Ur: 428 mOsm/kg (ref 300–900)

## 2016-04-24 LAB — SODIUM, URINE, RANDOM: Sodium, Ur: 18 mmol/L

## 2016-04-24 MED ORDER — ONDANSETRON HCL 4 MG PO TABS: 4.0000 mg | ORAL_TABLET | Freq: Four times a day (QID) | ORAL | Status: DC | PRN

## 2016-04-24 MED ORDER — CLONIDINE HCL 0.1 MG PO TABS
0.1000 mg | ORAL_TABLET | Freq: Two times a day (BID) | ORAL | Status: DC
  Administered 2016-04-24: 0.1 mg via ORAL
  Filled 2016-04-24: qty 1

## 2016-04-24 MED ORDER — ACETAMINOPHEN 650 MG RE SUPP: 650.0000 mg | Freq: Four times a day (QID) | RECTAL | Status: DC | PRN

## 2016-04-24 MED ORDER — ONDANSETRON HCL 4 MG/2ML IJ SOLN: 4.0000 mg | Freq: Four times a day (QID) | INTRAMUSCULAR | Status: DC | PRN

## 2016-04-24 MED ORDER — PHENYTOIN SODIUM EXTENDED 100 MG PO CAPS: 300.0000 mg | ORAL_CAPSULE | Freq: Every day | ORAL | Status: DC

## 2016-04-24 MED ORDER — TAMSULOSIN HCL 0.4 MG PO CAPS
0.4000 mg | ORAL_CAPSULE | Freq: Every day | ORAL | Status: DC
  Administered 2016-04-24: 0.4 mg via ORAL
  Filled 2016-04-24: qty 1

## 2016-04-24 MED ORDER — ACETAMINOPHEN 325 MG PO TABS: 650.0000 mg | ORAL_TABLET | Freq: Four times a day (QID) | ORAL | Status: DC | PRN

## 2016-04-24 MED ORDER — SODIUM CHLORIDE 0.9 % IV SOLN: INTRAVENOUS | Status: AC

## 2016-04-24 MED ORDER — CLONAZEPAM 0.5 MG PO TABS: 0.5000 mg | ORAL_TABLET | Freq: Two times a day (BID) | ORAL | Status: DC | PRN

## 2016-04-24 MED ORDER — LISINOPRIL 20 MG PO TABS
20.0000 mg | ORAL_TABLET | Freq: Every day | ORAL | Status: DC
  Administered 2016-04-24: 20 mg via ORAL
  Filled 2016-04-24: qty 1

## 2016-04-24 MED ORDER — LORAZEPAM 2 MG/ML IJ SOLN: 1.0000 mg | INTRAMUSCULAR | Status: DC | PRN

## 2016-04-24 MED ORDER — ENOXAPARIN SODIUM 40 MG/0.4ML ~~LOC~~ SOLN
40.0000 mg | SUBCUTANEOUS | Status: DC
  Filled 2016-04-24: qty 0.4

## 2016-04-24 MED ORDER — FINASTERIDE 5 MG PO TABS
5.0000 mg | ORAL_TABLET | Freq: Every day | ORAL | Status: DC
  Administered 2016-04-24: 5 mg via ORAL
  Filled 2016-04-24: qty 1

## 2016-04-24 NOTE — Progress Notes (Signed)
Neurology Progress Notes  My visit today was limited to discussion with the patient and his wife about seizure medications. The patient reports that he feels much improved and his wife indicates that he is essentially back to his normal baseline.   Conversation with the patient was notable for the fact that he is very perseverative. He has a tendency to repeat the same things over and over again. Specifically, he talks about the fact that he has been on multiple seizure medications since the age of 51 years and he always has dizziness associated with them. This segues to discussion about his multiple allergies, and he states that he has 4-5 pages worth of allergies which always lead to shortness of breath and decreased oxygen which then makes his dizziness worse. He had the same conversation several times while I was in the room with him. As a result, it is difficult to really ascertain what specific problems patient has had with any particular seizure medication over the years. He says everything makes him dizzy and unsteady but again this transitions into a discussion about his allergies and chronic sinus problems.  He stopped his Dilantin last week because he was having some increased dizziness and unsteadiness. However, his wife reports that the dizziness didn't start until he was placed on some new medications for his prostate. She is wondering if these could be to blame for the dizziness. He was placed on both Flomax and finasteride recently. She insists that he did not develop dizziness until he was placed on finasteride.  I briefly discussed alternate seizure medications with the patient's wife. However, they have advised me that they have an upcoming appointment with his treating neurologist on 04/27/16. As such, I have recommended that he continue taking phenytoin until they meet with her neurologist and I will defer any medication changes at this time.  His wife spoke with me separately outside  the room. She voices concerns about the patient's cognition. He has had significant memory trouble at home and she would like to see if cognitive testing could be arranged. Given my encounter with him today and the circular conversation, I do suspect that there is some underlying cognitive impairment or and perhaps even frank dementia.  This could be undermining attempts to stabilize medications and contribute to noncompliance.  Impression: 1. Epilepsy 2. Breakthrough seizures in the setting of medication noncompliance 3. Medication noncompliance 4. Probable cognitive impairment  Recommendations:  Continue phenytoin 300 mg daily at bedtime  Follow-up with neurologist 04/27/16 as scheduled  Recommend outpatient neuropsychological testing, encouraged his wife to discuss concerns about cognition with outpatient neurologist  Hold finasteride and discuss with her prescribing urologist as this was the last medication added before his dizziness developed  At this time, I have no additional recommendations and will sign off. Please feel free to call if there are any new questions or concerns.  A total of 35 minutes was spent, greater than 90% of which was spent counseling the patient and his wife.

## 2016-04-24 NOTE — Progress Notes (Signed)
Pt being discharged from hospital per orders from MD. Pt and spouse educated on discharge instructions. Pt and family verbalized understanding of instructions. All questions and concerns were addressed. Pt's IV was removed prior to discharge. Pt exited hospital via wheelchair.

## 2016-04-24 NOTE — Progress Notes (Signed)
[facility] paged

## 2016-04-24 NOTE — Discharge Summary (Signed)
Physician Discharge Summary  Noah Rose [id number] DOB: 06-Oct-1950 DOA: 04/23/2016  PCP: Idelle Crouch, MD  Admit date: 04/23/2016 Discharge date: 04/24/2016  Admitted From: Home Disposition:  Home  Recommendations for Outpatient Follow-up:  1. Follow up with neurologist Dr Dellis Filbert on 04/27/2016. ( 10:30 AM) 2. Please obtain cognition testing/ Neuropsychological testing during outpatient follow-up.  Home Health: None Equipment/Devices: None    Discharge Condition:stable CODE STATUS: full code Diet recommendation: regular   Discharge Diagnoses:  Principal Problem:   Seizure ([place])   Active Problems:   Essential hypertension   OSA (obstructive sleep apnea)   Hyponatremia   Microscopic hematuria   BPH (benign prostatic hyperplasia)  Brief narrative 65 y.o. male with a past medical history significant for epilepsy on Dilantin, HTN, and OSA not on CPAP who presents with seizures.  History provided by wife. She believes he stopped taking his Dilantin 10 days ago.  Evidently, 10 days ago he was very drowsy, nodding off all day.  The next day, she thinks he stopped his Dilantin.  he had breakthrough 2 days later of 2 episodes. They called his new neurologist who recommended he stay on Dilantin and schedule closer follow-up appointment to discuss medication adjustments.  Then on the day of admission the patient had 4 breakthrough seizures, so his wife brought him to the ER. Vitals in the ED showed elevated blood pressure, mild hyponatremia. Dilantin level < 2.5. Patient was given clonazepam and lorazepam by the EMS and loaded with IV Dilantin in the ED. Head CT unremarkable.   Hospital course Breakthrough seizures Loaded with IV Dilantin in the ED. Seen by neurologist who recommended to continue home dose Dilantin and wants stable follow-up with his neurologist. No further seizure activity. Patient is alert and oriented. Discussed and emphasized on being adherent to his  Dilantin. Patient informs that it makes him sick and dizzy. He has follow-up with his neurologist on 11/1 and again emphasized on continuing Dilantin.  Hyponatremia Possibly with mild dehydration. Prove with IV fluids.  Dizziness  Patient complains of being dizzy and stopped his Dilantin for that reason. He is on both Flomax and finasteride and I believe that might be causing him orthostatic. I have held his finasteride and may continue Flomax until he sees his urologist. Encouraged on hydrating adequately.  OSA Not tolerating CPAP  Uncontrolled hypertension Continue clonidine and lisinopril for now. Per wife he stopped taking all his medications 10 days back.  Microscopic hematuria Follow-up with urologist.   ? Progressive cognitive impairment Wife is concerned about patient having impaired cognition for some time. Recommend outpatient workup including cognition testing.   Patient stable to be discharged home.  Family communication: Discussed with wife in detail on the phone.  Consults: Neurology    Discharge Instructions     Medication List    STOP taking these medications   finasteride 5 MG tablet Commonly known as:  PROSCAR     TAKE these medications   clonazePAM 0.5 MG tablet Commonly known as:  KLONOPIN Take 0.5 mg by mouth 2 (two) times daily as needed. For anxiety.   cloNIDine 0.1 MG tablet Commonly known as:  CATAPRES Take 0.1 mg by mouth 2 (two) times daily.   lisinopril 20 MG tablet Commonly known as:  PRINIVIL,ZESTRIL Take by mouth.   phenytoin 300 MG ER capsule Commonly known as:  DILANTIN Take 1 capsule (300 mg total) by mouth at bedtime.   tamsulosin 0.4 MG Caps capsule Commonly known as:  FLOMAX Take  1 capsule (0.4 mg total) by mouth daily.      Follow-up Information    Encarnacion Slates, MD Follow up on 04/27/2016.   Specialty:  Neurology Contact information: [address] [place] [address] [phone]           Allergies  Allergen Reactions  . Almond Oil Shortness Of Breath  . Corn Oil Shortness Of Breath  . Dextroamphetamine Shortness Of Breath    dizziness  . Dust Mite Mixed Allergen Ext [Mite (D. Farinae)] Shortness Of Breath  . Salicylates Shortness Of Breath and Other (See Comments)    Other reaction(s): Other (See Comments), Syncope  . Amlodipine     Other reaction(s): Other (See Comments) Other Reaction: PATIENT STATES HE CANNOT TOLER dizziness  . Amphetamines     dizziness  . Aspirin     Other reaction(s): Unknown dizziness  . Beet [Beta Vulgaris]   . Cantaloupe (Diagnostic)   . Carrot [Daucus Carota]   . Cheese     American cheddar  . Corn-Containing Products   . Felodipine     Other reaction(s): Other (See Comments) Other Reaction: PATIENT STATES HE CANNOT TOLER dizziness  . Fructose   . Green Dyes     #3   . Indapamide     Other reaction(s): Other (See Comments) Other Reaction: PATIENT STATES HE CANNOT TOLER dizziness  . Iodinated Diagnostic Agents Other (See Comments)    Other reaction(s): Unknown Uncoded Allergy. Allergen: Preservatives, Other Reaction: HTN CRISIS Uncoded Allergy. Allergen: ANTICONVULSANTS Uncoded Allergy. Allergen: Preservatives, Other Reaction: HTN CRISIS Uncoded Allergy. Allergen: ANTICONVULSANTS  . Lambs Quarters     lamb  . Milk-Related Compounds   . Madelaine Bhat Isothiocyanate]   . Other Other (See Comments)    Other reaction(s): Unknown Uncoded Allergy. Allergen: ANTICONVULSANTS dizziness Other reaction(s): Unknown Uncoded Allergy. Allergen: ALCOHOL COUGH MEDICINE Other reaction(s): Unknown Numerous food allergies A lot of preservatives and medicine ingredients  . Pineapple   . Plum Pulp   . Raspberry   . Red Dye     #40  . Salicylic Acid     Other reaction(s): Other (See Comments) Many issues  . Scallops [Shellfish Allergy]   . Tea   . Vanilla   . Whey      Procedures/Studies: Ct Head Wo Contrast  Result  Date: 04/23/2016 CLINICAL DATA:  65 year old male with altered mental status and seizure like activity. EXAM: CT HEAD WITHOUT CONTRAST TECHNIQUE: Contiguous axial images were obtained from the base of the skull through the vertex without intravenous contrast. COMPARISON:  Brain MRI dated 03/16/2016 and CT dated 03/14/2016 FINDINGS: Brain: No evidence of acute infarction, hemorrhage, hydrocephalus, extra-axial collection or mass lesion/mass effect. Vascular: No hyperdense vessel or unexpected calcification. Skull: Normal. Negative for fracture or focal lesion. Sinuses/Orbits: Mild mucoperiosteal thickening of paranasal sinuses. No air-fluid levels. The mastoid air cells are clear. The globes and retro-orbital fat are preserved. Other: None IMPRESSION: No acute intracranial pathology. Electronically Signed   By: Anner Crete M.D.   On: 04/23/2016 21:15       Subjective: Further seizure activity. Denies any symptoms.  Discharge Exam: Vitals:   04/24/16 0500 04/24/16 1020  BP: (!) 149/96 (!) 166/90  Pulse: 88 77  Resp: 18 18  Temp: 99.3 F (37.4 C) 98 F (36.7 C)   Vitals:   04/24/16 0018 04/24/16 0100 04/24/16 0500 04/24/16 1020  BP: 169/98 (!) 149/101 (!) 149/96 (!) 166/90  Pulse: 89 81 88 77  Resp:  16 17 18 18   Temp: 100.7 F (38.2 C) 99.5 F (37.5 C) 99.3 F (37.4 C) 98 F (36.7 C)  TempSrc:  Axillary Axillary Oral  SpO2: 99% 100% 99% 99%  Weight: 79.6 kg (175 lb 7.8 oz)     Height: 5\' 9"  (1.753 m)       General: Elderly male not in distress HEENT: No pallor, moist because of a supple neck Cardiovascular: RRR, S1/S2 +, no rubs, no gallops Chest: CTA bilaterally, no wheezing, no rhonchi GI: Soft, NT, ND, bowel sounds + Musculoskeletal: Warm, no edema CNS: Alert and oriented    The results of significant diagnostics from this hospitalization (including imaging, microbiology, ancillary and laboratory) are listed below for reference.     Microbiology: No results  found for this or any previous visit (from the past 240 hour(s)).   Labs: BNP (last 3 results) No results for input(s): BNP in the last 8760 hours. Basic Metabolic Panel:  Recent Labs Lab 04/23/16 1942 04/24/16 0101  NA 131* 137  K 4.2 3.9  CL 100* 105  CO2 23 25  GLUCOSE 105* 115*  BUN 25* 22*  CREATININE 1.03 0.95  CALCIUM 8.6* 8.6*   Liver Function Tests: No results for input(s): AST, ALT, ALKPHOS, BILITOT, PROT, ALBUMIN in the last 168 hours. No results for input(s): LIPASE, AMYLASE in the last 168 hours. No results for input(s): AMMONIA in the last 168 hours. CBC:  Recent Labs Lab 04/23/16 1942  WBC 8.3  NEUTROABS 6.5  HGB 14.2  HCT 42.5  MCV 81.7  PLT 164   Cardiac Enzymes: No results for input(s): CKTOTAL, CKMB, CKMBINDEX, TROPONINI in the last 168 hours. BNP: Invalid input(s): POCBNP CBG: No results for input(s): GLUCAP in the last 168 hours. D-Dimer No results for input(s): DDIMER in the last 72 hours. Hgb A1c No results for input(s): HGBA1C in the last 72 hours. Lipid Profile No results for input(s): CHOL, HDL, LDLCALC, TRIG, CHOLHDL, LDLDIRECT in the last 72 hours. Thyroid function studies No results for input(s): TSH, T4TOTAL, T3FREE, THYROIDAB in the last 72 hours.  Invalid input(s): FREET3 Anemia work up No results for input(s): VITAMINB12, FOLATE, FERRITIN, TIBC, IRON, RETICCTPCT in the last 72 hours. Urinalysis    Component Value Date/Time   COLORURINE YELLOW 04/23/2016 1921   APPEARANCEUR CLEAR 04/23/2016 1921   APPEARANCEUR Hazy (A) 03/30/2016 0955   LABSPEC 1.012 04/23/2016 1921   PHURINE 8.0 04/23/2016 1921   GLUCOSEU NEGATIVE 04/23/2016 1921   HGBUR MODERATE (A) 04/23/2016 1921   BILIRUBINUR NEGATIVE 04/23/2016 1921   BILIRUBINUR Negative 03/30/2016 [place] 04/23/2016 1921   PROTEINUR NEGATIVE 04/23/2016 1921   UROBILINOGEN 0.2 01/01/2015 0415   NITRITE NEGATIVE 04/23/2016 1921   LEUKOCYTESUR NEGATIVE  04/23/2016 1921   LEUKOCYTESUR Negative 03/30/2016 0955   Sepsis Labs Invalid input(s): PROCALCITONIN,  WBC,  LACTICIDVEN Microbiology No results found for this or any previous visit (from the past 240 hour(s)).   Time coordinating discharge: <30 minutes  SIGNED:   Louellen Molder, MD  [facility] 04/24/2016, 11:35 AM Pager   If 7PM-7AM, please contact night-coverage [email or web address] Password [email or web address]

## 2016-04-24 NOTE — Progress Notes (Signed)
Patient arrived to unit alert and orient to person and time, lethargic, intermittent snoring. Patient unable to follow commands due to lethargy. Seizure precautions initiated. Patient allergies, medications, and past medical history reviewed and confirmed by patients wife at bedside. All medications and orders reviewed to patient and patients wife. Condum catheter placed due to incontinent episodes, urgent=cy, and frequency. RN will continue to monitor patient.

## 2016-04-24 NOTE — Discharge Instructions (Signed)
Epilepsy °People with epilepsy have times when they shake and jerk uncontrollably (seizures). This happens when there is a sudden change in brain function. Epilepsy may have many possible causes. Anything that disturbs the normal pattern of brain cell activity can lead to seizures. °HOME CARE  °· Follow your doctor's instructions about driving and safety during normal activities. °· Get enough sleep. °· Only take medicine as told by your doctor. °· Avoid things that you know can cause you to have seizures (triggers). °· Write down when your seizures happen and what you remember about each seizure. Write down anything you think may have caused the seizure to happen. °· Tell the people you live and work with that you have seizures. Make sure they know how to help you. They should: °¨ Cushion your head and body. °¨ Turn you on your side. °¨ Not restrain you. °¨ Not place anything inside your mouth. °¨ Call for local emergency medical help if there is any question about what has happened. °· Keep all follow-up visits with your doctor. This is very important. °GET HELP IF: °· You get an infection or start to feel sick. You may have more seizures when you are sick. °· You are having seizures more often. °· Your seizure pattern is changing. °GET HELP RIGHT AWAY IF:  °· A seizure does not stop after a few seconds or minutes. °· A seizure causes you to have trouble breathing. °· A seizure gives you a very bad headache. °· A seizure makes you unable to speak or use a part of your body. °  °This information is not intended to replace advice given to you by your health care provider. Make sure you discuss any questions you have with your health care provider. °  °Document Released: 04/10/2009 Document Revised: 04/03/2013 Document Reviewed: 01/23/2013 °Elsevier Interactive Patient Education ©2016 [employer] ° °

## 2016-05-04 ENCOUNTER — Telehealth: Payer: Self-pay | Admitting: Urology

## 2016-05-04 NOTE — Telephone Encounter (Signed)
Spoke with pt in reference to taking finasteride and stopping flomax. Pt voiced understanding.

## 2016-05-04 NOTE — Telephone Encounter (Signed)
Patient needs a call back regarding his medication. He may be having some kind of reaction to his meds. He ended up at [facility] and the ER doctor stopped his medications not sure which one's. Please call the patient back to discuss this At [phone]  Or [phone]    His was on Flomax and they said he had some kind of reaction with this and his other meds. It was making him drowsy? The ER doctor told them to contact our office.   Sharyn Lull

## 2016-05-04 NOTE — Telephone Encounter (Signed)
Please have him stop Flomax only. The finasteride should not be affecting his blood pressure. We'll reevaluate at his next appointment.  Hollice Espy, MD

## 2016-05-04 NOTE — Telephone Encounter (Signed)
Spoke with pt and wife in reference to medication issues. Wife stated that ER physician advised pt to stop taking flomax and finasteride due to lowering of BP and dizziness. Pt stated that he would like to try another medication if possible due to flomax working well for him. Please advise.

## 2016-06-08 DIAGNOSIS — Z9119 Patient's noncompliance with other medical treatment and regimen: Secondary | ICD-10-CM | POA: Insufficient documentation

## 2016-06-08 DIAGNOSIS — Z91199 Patient's noncompliance with other medical treatment and regimen due to unspecified reason: Secondary | ICD-10-CM | POA: Insufficient documentation

## 2016-07-01 ENCOUNTER — Ambulatory Visit (INDEPENDENT_AMBULATORY_CARE_PROVIDER_SITE_OTHER): Payer: Medicare Other | Admitting: Urology

## 2016-07-01 VITALS — BP 163/89 | HR 72 | Ht 69.0 in | Wt 196.5 lb

## 2016-07-01 DIAGNOSIS — R339 Retention of urine, unspecified: Secondary | ICD-10-CM

## 2016-07-01 DIAGNOSIS — N401 Enlarged prostate with lower urinary tract symptoms: Secondary | ICD-10-CM | POA: Diagnosis not present

## 2016-07-01 DIAGNOSIS — R3915 Urgency of urination: Secondary | ICD-10-CM

## 2016-07-01 DIAGNOSIS — N138 Other obstructive and reflux uropathy: Secondary | ICD-10-CM

## 2016-07-01 DIAGNOSIS — R3129 Other microscopic hematuria: Secondary | ICD-10-CM | POA: Diagnosis not present

## 2016-07-01 LAB — URINALYSIS, COMPLETE
BILIRUBIN UA: NEGATIVE
Glucose, UA: NEGATIVE
KETONES UA: NEGATIVE
LEUKOCYTES UA: NEGATIVE
Nitrite, UA: NEGATIVE
SPEC GRAV UA: 1.015 (ref 1.005–1.030)
Urobilinogen, Ur: 0.2 mg/dL (ref 0.2–1.0)
pH, UA: 8.5 — ABNORMAL HIGH (ref 5.0–7.5)

## 2016-07-01 LAB — MICROSCOPIC EXAMINATION: Epithelial Cells (non renal): NONE SEEN /hpf (ref 0–10)

## 2016-07-01 LAB — BLADDER SCAN AMB NON-IMAGING: Scan Result: 116

## 2016-07-01 NOTE — Progress Notes (Signed)
07/01/2016 10:58 AM   Noah Rose [birth year] [id number]  Referring provider: Idelle Crouch, MD [facility] [place], [place] [address]  Chief Complaint  Patient presents with  . Benign Prostatic Hypertrophy    HPI: 66 yo M With history of BPH/incomplete bladder emptying who was initially evaluated in 03/2016 with worsened urinary symptoms.  He is placed on Flomax in addition to finasteride at that time.  He had an emergency room visit for hypotension and stopped taking his Flomax thereafter. He continued finasteride but he is only taking it every other day.    He notes that his urinary symptoms have improved since last visit.  He is mostly bothered by urinary frequency and urgency which is the same from previous visit primarily during the day.  He does also have occasional urge incontinence which is rare.  No dysuria or gross hematuria.  He has a weak urinary stream especially at night.  He gets up 0-2x times nightly to void.  IPSS improved today.  See below.  History of  elevated PVRs 175- 250 rage cc.  PVR today 116 cc.     Most recent Cr 0.95.  His last HgbA1c was been low, well controlled.    Most recent PSA 1.7 on 08/2015.  No family history of prostate cancer.  This is up from 1.17 in 3/87/16 and 0.8 in 0.8 04/2010.    Rectal exam 10/17 showed 50+ cc prostate with no nodules.  He drinks mostly water.  He does not drink any caffeine.    Incidental microscopic hematuria last visit. Recheck today shows persistent micro-heme.      IPSS    Row Name 07/01/16 1000         International Prostate Symptom Score   How often have you had the sensation of not emptying your bladder? Not at All     How often have you had to urinate less than every two hours? Not at All     How often have you found you stopped and started again several times when you urinated? Almost always     How often have you found it difficult to postpone urination? Almost  always     How often have you had a weak urinary stream? Less than 1 in 5 times     How often have you had to strain to start urination? Not at All     How many times did you typically get up at night to urinate? 1 Time     Total IPSS Score 12       Quality of Life due to urinary symptoms   If you were to spend the rest of your life with your urinary condition just the way it is now how would you feel about that? Mixed        Score:  1-7 Mild 8-19 Moderate 20-35 Severe  PMH: Past Medical History:  Diagnosis Date  . Anxiety   . Dizziness   . High blood pressure   . Hypertension   . Multiple allergies   . Seizures ([place])     Surgical History: No past surgical history on file.  Home Medications:  Allergies as of 07/01/2016      Reactions   Almond Oil Shortness Of Breath   Corn Oil Shortness Of Breath   Dextroamphetamine Shortness Of Breath   dizziness   Dust Mite Mixed Allergen Ext [mite (d. Farinae)] Shortness Of Breath   Salicylates Shortness Of  Breath, Other (See Comments)   Other reaction(s): Other (See Comments), Syncope   Amlodipine    Other reaction(s): Other (See Comments) Other Reaction: PATIENT STATES HE CANNOT TOLER dizziness   Amphetamines    dizziness   Aspirin    Other reaction(s): Unknown dizziness   Beet [beta Vulgaris]    Cantaloupe (diagnostic)    Carrot [daucus Carota]    Cheese    American cheddar   Corn-containing Products    Felodipine    Other reaction(s): Other (See Comments) Other Reaction: PATIENT STATES HE CANNOT TOLER dizziness   Fructose    Green Dyes    #3   Indapamide    Other reaction(s): Other (See Comments) Other Reaction: PATIENT STATES HE CANNOT TOLER dizziness   Iodinated Diagnostic Agents Other (See Comments)   Other reaction(s): Unknown Uncoded Allergy. Allergen: Preservatives, Other Reaction: HTN CRISIS Uncoded Allergy. Allergen: ANTICONVULSANTS Uncoded Allergy. Allergen: Preservatives, Other Reaction: HTN  CRISIS Uncoded Allergy. Allergen: ANTICONVULSANTS   Lambs Quarters    lamb   Milk-related Compounds    Madelaine Bhat Isothiocyanate]    Other Other (See Comments)   Other reaction(s): Unknown Uncoded Allergy. Allergen: ANTICONVULSANTS dizziness Other reaction(s): Unknown Uncoded Allergy. Allergen: ALCOHOL COUGH MEDICINE Other reaction(s): Unknown Numerous food allergies A lot of preservatives and medicine ingredients   Pineapple    Plum Pulp    Raspberry    Red Dye    [id number]   Salicylic Acid    Other reaction(s): Other (See Comments) Many issues   Scallops [shellfish Allergy]    Tea    Vanilla    Whey       Medication List       Accurate as of 07/01/16 10:58 AM. Always use your most recent med list.          clonazePAM 0.5 MG tablet Commonly known as:  KLONOPIN Take 0.5 mg by mouth 2 (two) times daily as needed. For anxiety.   cloNIDine 0.1 MG tablet Commonly known as:  CATAPRES Take 0.1 mg by mouth 2 (two) times daily.   finasteride 5 MG tablet Commonly known as:  PROSCAR Take 5 mg by mouth daily.   lisinopril 20 MG tablet Commonly known as:  PRINIVIL,ZESTRIL Take by mouth.   phenytoin 300 MG ER capsule Commonly known as:  DILANTIN Take 1 capsule (300 mg total) by mouth at bedtime.       Allergies:  Allergies  Allergen Reactions  . Almond Oil Shortness Of Breath  . Corn Oil Shortness Of Breath  . Dextroamphetamine Shortness Of Breath    dizziness  . Dust Mite Mixed Allergen Ext [Mite (D. Farinae)] Shortness Of Breath  . Salicylates Shortness Of Breath and Other (See Comments)    Other reaction(s): Other (See Comments), Syncope  . Amlodipine     Other reaction(s): Other (See Comments) Other Reaction: PATIENT STATES HE CANNOT TOLER dizziness  . Amphetamines     dizziness  . Aspirin     Other reaction(s): Unknown dizziness  . Beet [Beta Vulgaris]   . Cantaloupe (Diagnostic)   . Carrot [Daucus Carota]   . Cheese     American cheddar  .  Corn-Containing Products   . Felodipine     Other reaction(s): Other (See Comments) Other Reaction: PATIENT STATES HE CANNOT TOLER dizziness  . Fructose   . Green Dyes     #3   . Indapamide     Other reaction(s): Other (See Comments) Other Reaction: PATIENT STATES HE CANNOT TOLER dizziness  .  Iodinated Diagnostic Agents Other (See Comments)    Other reaction(s): Unknown Uncoded Allergy. Allergen: Preservatives, Other Reaction: HTN CRISIS Uncoded Allergy. Allergen: ANTICONVULSANTS Uncoded Allergy. Allergen: Preservatives, Other Reaction: HTN CRISIS Uncoded Allergy. Allergen: ANTICONVULSANTS  . Lambs Quarters     lamb  . Milk-Related Compounds   . Madelaine Bhat Isothiocyanate]   . Other Other (See Comments)    Other reaction(s): Unknown Uncoded Allergy. Allergen: ANTICONVULSANTS dizziness Other reaction(s): Unknown Uncoded Allergy. Allergen: ALCOHOL COUGH MEDICINE Other reaction(s): Unknown Numerous food allergies A lot of preservatives and medicine ingredients  . Pineapple   . Plum Pulp   . Raspberry   . Red Dye     #40  . Salicylic Acid     Other reaction(s): Other (See Comments) Many issues  . Scallops [Shellfish Allergy]   . Tea   . Vanilla   . Whey     Family History: Family History  Problem Relation Age of Onset  . Dementia Mother   . Heart disease Father   . Parkinson's disease Sister   . Diabetes Sister   . Pancreatic cancer Sister   . Leukemia Sister     Social History:  reports that he has never smoked. He has never used smokeless tobacco. He reports that he does not drink alcohol or use drugs.  ROS: UROLOGY Frequent Urination?: Yes Hard to postpone urination?: No Burning/pain with urination?: No Get up at night to urinate?: Yes Leakage of urine?: Yes Urine stream starts and stops?: No Trouble starting stream?: No Do you have to strain to urinate?: No Blood in urine?: No Urinary tract infection?: No Sexually transmitted disease?:  No Injury to kidneys or bladder?: No Painful intercourse?: No Weak stream?: No Erection problems?: No Penile pain?: No  Gastrointestinal Nausea?: No Vomiting?: No Indigestion/heartburn?: No Diarrhea?: No Constipation?: No  Constitutional Fever: No Night sweats?: No Weight loss?: No Fatigue?: No  Skin Skin rash/lesions?: No Itching?: No  Eyes Blurred vision?: No Double vision?: No  Ears/Nose/Throat Sore throat?: No Sinus problems?: No  Hematologic/Lymphatic Swollen glands?: No Easy bruising?: No  Cardiovascular Leg swelling?: No Chest pain?: No  Respiratory Cough?: No Shortness of breath?: No  Endocrine Excessive thirst?: No  Musculoskeletal Back pain?: No Joint pain?: No  Neurological Headaches?: No Dizziness?: No  Psychologic Depression?: No Anxiety?: No  Physical Exam: BP (!) 163/89 (BP Location: Left Arm, Patient Position: Sitting, Cuff Size: Normal)   Pulse 72   Ht 5\' 9"  (1.753 m)   Wt 196 lb 8 oz (89.1 kg)   BMI 29.02 kg/m   Constitutional:  Alert and oriented, No acute distress.  HEENT: [place] AT, moist mucus membranes.  Trachea midline, no masses. Cardiovascular: No clubbing, cyanosis, or edema. Respiratory: Normal respiratory effort, no increased work of breathing. GI: Abdomen is soft, nontender, nondistended, no abdominal masses Skin: No rashes, bruises or suspicious lesions. Neurologic: Grossly intact, no focal deficits, moving all 4 extremities. Psychiatric: Flat affect.  Laboratory Data: Lab Results  Component Value Date   WBC 8.3 04/23/2016   HGB 14.2 04/23/2016   HCT 42.5 04/23/2016   MCV 81.7 04/23/2016   PLT 164 04/23/2016    Lab Results  Component Value Date   CREATININE 0.95 04/24/2016    Lab Results  Component Value Date   HGBA1C 5.2 03/16/2016    Urinalysis Component     Latest Ref Rng & Units 07/01/2016        11:27 AM  Specific Gravity, UA     1.005 - 1.030  1.015  pH, UA     5.0 - 7.5 8.5 (H)   Color, UA     Yellow Yellow  Appearance Ur     Clear Cloudy (A)  Leukocytes, UA     Negative Negative  Protein, UA     Negative/Trace 1+ (A)  Glucose     Negative Negative  Ketones, UA     Negative Negative  RBC, UA     Negative 1+ (A)  Bilirubin, UA     Negative Negative  Urobilinogen, Ur     0.2 - 1.0 mg/dL 0.2  Nitrite, UA     Negative Negative  Microscopic Examination      See below:   3-10 red blood cells per high-power field.  Pertinent Imaging: Results for orders placed or performed in visit on 07/01/16  Bladder Scan (Post Void Residual) in office  Result Value Ref Range   Scan Result 116     Assessment & Plan:    1. BPH with obstruction/lower urinary tract symptoms Long-standing acute on chronic urinary issues Large prostate on exam with both obstructive and irritative voiding symptoms Continue finasteride- currently only taking every other day- advised to take daily Will recheck rectal exam/ PSA next visit - Urinalysis, Complete - BLADDER SCAN AMB NON-IMAGING  2. Incomplete bladder emptying Elevated postvoid in the 100-250 cc range PVR improving Continue to follow Cr stable  3. Urinary frequency Not ideal candidate for anticholinergic medication given incomplete bladder emtpying   4. Microscopic hematuria Incidental microscopic hematuria 10/17 following recent catheterization, not appreciated on previous UAs Rechecked today- persistent microscopic hematuria  We discussed the differential diagnosis for microscopic hematuria including nephrolithiasis, renal or upper tract tumors, bladder stones, UTIs, or bladder tumors as well as undetermined etiologies. Per AUA guidelines, I did recommend complete microscopic hematuria evaluation including CTU, possible urine cytology, and office cystoscopy. - CT hematuria work up   Return in about 6 months (around 12/29/2016) for PVR, IPSS, DRE, PSA.   AND cysto/ CT Urogram   Hollice Espy, MD  [facility] [address], [place], [place] [address] [phone]

## 2016-07-04 ENCOUNTER — Telehealth: Payer: Self-pay | Admitting: Urology

## 2016-07-04 NOTE — Telephone Encounter (Signed)
appt has been made and I will contact the patient   Noah Rose

## 2016-07-12 ENCOUNTER — Telehealth: Payer: Self-pay | Admitting: Urology

## 2016-07-12 NOTE — Telephone Encounter (Signed)
Stephanie from [place] called about this patient. He is scheduled for Wednesday, 1/17 at 1:30pm.   It is documented in his chart that he is allergic to iodine. The CT tech called the patient to inquire about the iodine allergy.  The patient stated that when he had a CT with contrast at [facility] on 12/07/2014, it "messed him up".  The patient states that it should be documented in his chart what happened at [facility].  Please review his chart and advise on how we should proceed with the CT that you have ordered.

## 2016-07-13 ENCOUNTER — Ambulatory Visit: Admission: RE | Admit: 2016-07-13 | Payer: Medicare Other | Source: Ambulatory Visit

## 2016-07-13 ENCOUNTER — Ambulatory Visit: Payer: Medicare Other

## 2016-07-15 ENCOUNTER — Ambulatory Visit: Admission: RE | Admit: 2016-07-15 | Payer: Medicare Other | Source: Ambulatory Visit

## 2016-07-19 ENCOUNTER — Ambulatory Visit
Admission: RE | Admit: 2016-07-19 | Discharge: 2016-07-19 | Disposition: A | Discharge status: Home / Self Care | Payer: Medicare Other | Source: Ambulatory Visit | Attending: Urology | Admitting: Urology

## 2016-07-19 DIAGNOSIS — N400 Enlarged prostate without lower urinary tract symptoms: Secondary | ICD-10-CM | POA: Diagnosis not present

## 2016-07-19 DIAGNOSIS — R911 Solitary pulmonary nodule: Secondary | ICD-10-CM | POA: Insufficient documentation

## 2016-07-19 DIAGNOSIS — N281 Cyst of kidney, acquired: Secondary | ICD-10-CM | POA: Diagnosis not present

## 2016-07-19 DIAGNOSIS — N132 Hydronephrosis with renal and ureteral calculous obstruction: Secondary | ICD-10-CM | POA: Diagnosis not present

## 2016-07-19 DIAGNOSIS — R3129 Other microscopic hematuria: Secondary | ICD-10-CM | POA: Diagnosis present

## 2016-07-19 DIAGNOSIS — J8410 Pulmonary fibrosis, unspecified: Secondary | ICD-10-CM | POA: Diagnosis not present

## 2016-07-19 LAB — POCT I-STAT CREATININE: CREATININE: 1.2 mg/dL (ref 0.61–1.24)

## 2016-07-19 MED ORDER — IOPAMIDOL (ISOVUE-300) INJECTION 61%
125.0000 mL | Freq: Once | INTRAVENOUS | Status: AC | PRN
  Administered 2016-07-19: 125 mL via INTRAVENOUS

## 2016-07-19 NOTE — Telephone Encounter (Signed)
I see no documentation in his previous chart that he had any allergy to contrast media. Okay to proceed as planned.  Hollice Espy, MD

## 2016-07-25 ENCOUNTER — Other Ambulatory Visit: Payer: Medicare Other | Admitting: Urology

## 2016-07-26 ENCOUNTER — Ambulatory Visit (INDEPENDENT_AMBULATORY_CARE_PROVIDER_SITE_OTHER): Payer: Medicare Other | Admitting: Urology

## 2016-07-26 ENCOUNTER — Encounter: Payer: Self-pay | Admitting: Urology

## 2016-07-26 VITALS — BP 208/121 | HR 98 | Ht 69.0 in | Wt 188.0 lb

## 2016-07-26 DIAGNOSIS — N200 Calculus of kidney: Secondary | ICD-10-CM

## 2016-07-26 DIAGNOSIS — R3129 Other microscopic hematuria: Secondary | ICD-10-CM | POA: Diagnosis not present

## 2016-07-26 DIAGNOSIS — R339 Retention of urine, unspecified: Secondary | ICD-10-CM

## 2016-07-26 DIAGNOSIS — N401 Enlarged prostate with lower urinary tract symptoms: Secondary | ICD-10-CM

## 2016-07-26 DIAGNOSIS — N138 Other obstructive and reflux uropathy: Secondary | ICD-10-CM

## 2016-07-26 DIAGNOSIS — Q638 Other specified congenital malformations of kidney: Secondary | ICD-10-CM

## 2016-07-26 DIAGNOSIS — IMO0001 Reserved for inherently not codable concepts without codable children: Secondary | ICD-10-CM

## 2016-07-26 LAB — MICROSCOPIC EXAMINATION
Bacteria, UA: NONE SEEN
Epithelial Cells (non renal): NONE SEEN /hpf (ref 0–10)

## 2016-07-26 LAB — URINALYSIS, COMPLETE
Bilirubin, UA: NEGATIVE
Glucose, UA: NEGATIVE
Ketones, UA: NEGATIVE
LEUKOCYTES UA: NEGATIVE
NITRITE UA: NEGATIVE
PH UA: 8.5 — AB (ref 5.0–7.5)
RBC, UA: NEGATIVE
SPEC GRAV UA: 1.015 (ref 1.005–1.030)
Urobilinogen, Ur: 0.2 mg/dL (ref 0.2–1.0)

## 2016-07-26 MED ORDER — CIPROFLOXACIN HCL 500 MG PO TABS
500.0000 mg | ORAL_TABLET | Freq: Once | ORAL | Status: AC
  Administered 2016-07-26: 500 mg via ORAL

## 2016-07-26 MED ORDER — LIDOCAINE HCL 2 % EX GEL
1.0000 "application " | Freq: Once | CUTANEOUS | Status: AC
  Administered 2016-07-26: 1 via URETHRAL

## 2016-07-26 NOTE — Progress Notes (Signed)
   07/26/16  CC:  Chief Complaint  Patient presents with  . 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 He is a known history of BPH on finasteride, incomplete bladder emptying. He underwent CT urogram on 07/19/2016 which showed prostatomegaly, 11 mm left lower pole nonobstructing calculus and bilateral renal pelvic fullness with decompressed otherwise decompressed ureters.  He does also have an incidental 1-2 mm right lower pole lung nodule, low risk therefore no need for follow-up.  Blood pressure (!) 208/121, pulse 98, height 5\' 9"  (1.753 m), weight 188 lb (85.3 kg). NED. A&Ox3.   No respiratory distress   Abd soft, NT, ND Normal phallus with bilateral descended testicles  Cystoscopy Procedure Note  Patient identification was confirmed, informed consent was obtained, and patient was prepped using Betadine solution.  Lidocaine jelly was administered per urethral meatus.    Preoperative abx where received prior to procedure.     Pre-Procedure: - Inspection reveals a normal caliber ureteral meatus.  Procedure: The flexible cystoscope was introduced without difficulty - No urethral strictures/lesions are present. - Enlarged prostate trilobar coaptation - Elevated bladder neck - Bilateral ureteral orifices identified - Bladder mucosa  reveals no ulcers, tumors, or lesions - No bladder stones -Mild trabeculation with small diverticulum appreciated on retroflexion  Retroflexion shows intravesical protrusion of the median lobe.   Post-Procedure: - Patient tolerated the procedure well  Assessment/ Plan:  1. Microscopic hematuria S/p work up as above including CT urogram and cystoscopy Microscopic bleeding either secondary to enlarged prostate versus nonobstructing stones - Urinalysis, Complete - ciprofloxacin (CIPRO) tablet 500 mg; Take 1 tablet (500 mg total) by mouth once. - lidocaine (XYLOCAINE) 2  % jelly 1 application; Place 1 application into the urethra once.  2. BPH with obstruction/lower urinary tract symptoms Continue finasteride, we'll reassess at next visit  3. Incomplete bladder emptying As above  4. Kidney stones Nonobstructing 11 mm left lower pole kidney stone, incidental, asymptomatic We'll continue to follow, discuss further intervention versus continued surveillance at next visit  5. Extrarenal pelvis Bilateral renal pelvic fullness either consistent with bilateral extra renal pelvis versus low-grade bilateral UPJ obstruction Asymptomatic, will continue to follow    Hollice Espy, MD

## 2016-08-26 ENCOUNTER — Other Ambulatory Visit: Payer: Medicare Other | Admitting: Urology

## 2017-01-04 ENCOUNTER — Ambulatory Visit: Payer: Medicare Other | Admitting: Urology

## 2017-01-06 ENCOUNTER — Ambulatory Visit: Payer: Medicare Other | Admitting: Urology

## 2017-01-11 ENCOUNTER — Inpatient Hospital Stay (HOSPITAL_COMMUNITY)
Admission: EM | Admit: 2017-01-11 | Discharge: 2017-01-13 | DRG: 101 | Disposition: A | Discharge status: Home / Self Care | Payer: Non-veteran care | Attending: Internal Medicine | Admitting: Internal Medicine

## 2017-01-11 ENCOUNTER — Encounter (HOSPITAL_COMMUNITY): Payer: Self-pay | Admitting: *Deleted

## 2017-01-11 ENCOUNTER — Emergency Department (HOSPITAL_COMMUNITY): Payer: Non-veteran care

## 2017-01-11 DIAGNOSIS — Z9102 Food additives allergy status: Secondary | ICD-10-CM

## 2017-01-11 DIAGNOSIS — Z9119 Patient's noncompliance with other medical treatment and regimen: Secondary | ICD-10-CM

## 2017-01-11 DIAGNOSIS — R569 Unspecified convulsions: Secondary | ICD-10-CM | POA: Diagnosis not present

## 2017-01-11 DIAGNOSIS — Z91199 Patient's noncompliance with other medical treatment and regimen due to unspecified reason: Secondary | ICD-10-CM

## 2017-01-11 DIAGNOSIS — R42 Dizziness and giddiness: Secondary | ICD-10-CM | POA: Diagnosis present

## 2017-01-11 DIAGNOSIS — Z91041 Radiographic dye allergy status: Secondary | ICD-10-CM

## 2017-01-11 DIAGNOSIS — Z91048 Other nonmedicinal substance allergy status: Secondary | ICD-10-CM

## 2017-01-11 DIAGNOSIS — N183 Chronic kidney disease, stage 3 unspecified: Secondary | ICD-10-CM | POA: Diagnosis present

## 2017-01-11 DIAGNOSIS — Z79899 Other long term (current) drug therapy: Secondary | ICD-10-CM

## 2017-01-11 DIAGNOSIS — G40209 Localization-related (focal) (partial) symptomatic epilepsy and epileptic syndromes with complex partial seizures, not intractable, without status epilepticus: Principal | ICD-10-CM | POA: Diagnosis present

## 2017-01-11 DIAGNOSIS — F419 Anxiety disorder, unspecified: Secondary | ICD-10-CM | POA: Diagnosis present

## 2017-01-11 DIAGNOSIS — Z888 Allergy status to other drugs, medicaments and biological substances status: Secondary | ICD-10-CM

## 2017-01-11 DIAGNOSIS — Z91018 Allergy to other foods: Secondary | ICD-10-CM

## 2017-01-11 DIAGNOSIS — I160 Hypertensive urgency: Secondary | ICD-10-CM | POA: Diagnosis not present

## 2017-01-11 DIAGNOSIS — R9401 Abnormal electroencephalogram [EEG]: Secondary | ICD-10-CM | POA: Diagnosis present

## 2017-01-11 DIAGNOSIS — S0101XA Laceration without foreign body of scalp, initial encounter: Secondary | ICD-10-CM

## 2017-01-11 DIAGNOSIS — Z9109 Other allergy status, other than to drugs and biological substances: Secondary | ICD-10-CM

## 2017-01-11 DIAGNOSIS — Z9114 Patient's other noncompliance with medication regimen: Secondary | ICD-10-CM

## 2017-01-11 DIAGNOSIS — I129 Hypertensive chronic kidney disease with stage 1 through stage 4 chronic kidney disease, or unspecified chronic kidney disease: Secondary | ICD-10-CM | POA: Diagnosis present

## 2017-01-11 DIAGNOSIS — Z886 Allergy status to analgesic agent status: Secondary | ICD-10-CM

## 2017-01-11 DIAGNOSIS — Z91011 Allergy to milk products: Secondary | ICD-10-CM

## 2017-01-11 DIAGNOSIS — G4730 Sleep apnea, unspecified: Secondary | ICD-10-CM | POA: Diagnosis present

## 2017-01-11 DIAGNOSIS — F0390 Unspecified dementia without behavioral disturbance: Secondary | ICD-10-CM | POA: Diagnosis present

## 2017-01-11 DIAGNOSIS — R0602 Shortness of breath: Secondary | ICD-10-CM | POA: Diagnosis present

## 2017-01-11 DIAGNOSIS — W19XXXA Unspecified fall, initial encounter: Secondary | ICD-10-CM | POA: Diagnosis present

## 2017-01-11 LAB — BASIC METABOLIC PANEL
ANION GAP: 7 (ref 5–15)
BUN: 29 mg/dL — ABNORMAL HIGH (ref 6–20)
CHLORIDE: 107 mmol/L (ref 101–111)
CO2: 25 mmol/L (ref 22–32)
Calcium: 8.7 mg/dL — ABNORMAL LOW (ref 8.9–10.3)
Creatinine, Ser: 1.35 mg/dL — ABNORMAL HIGH (ref 0.61–1.24)
GFR calc Af Amer: >60 mL/min (ref >60)
GFR calc non Af Amer: 53 mL/min — ABNORMAL LOW (ref >60)
Glucose, Bld: 97 mg/dL (ref 65–99)
POTASSIUM: 3.7 mmol/L (ref 3.5–5.1)
Sodium: 139 mmol/L (ref 135–145)

## 2017-01-11 LAB — PHENYTOIN LEVEL, TOTAL: Phenytoin Lvl: <2.5 ug/mL — ABNORMAL LOW (ref 10.0–20.0)

## 2017-01-11 LAB — CBC
HEMATOCRIT: 42.8 % (ref 39.0–52.0)
HEMOGLOBIN: 14.1 g/dL (ref 13.0–17.0)
MCH: 27.8 pg (ref 26.0–34.0)
MCHC: 32.9 g/dL (ref 30.0–36.0)
MCV: 84.4 fL (ref 78.0–100.0)
Platelets: 171 10*3/uL (ref 150–400)
RBC: 5.07 MIL/uL (ref 4.22–5.81)
RDW: 13.8 % (ref 11.5–15.5)
WBC: 7.1 10*3/uL (ref 4.0–10.5)

## 2017-01-11 MED ORDER — CLONAZEPAM 0.5 MG PO TABS
0.5000 mg | ORAL_TABLET | Freq: Once | ORAL | Status: AC
  Administered 2017-01-11: 0.5 mg via ORAL
  Filled 2017-01-11: qty 1

## 2017-01-11 MED ORDER — SODIUM CHLORIDE 0.9 % IV SOLN
1500.0000 mg | Freq: Once | INTRAVENOUS | Status: AC
  Administered 2017-01-11: 1500 mg via INTRAVENOUS
  Filled 2017-01-11: qty 30

## 2017-01-11 MED ORDER — TETANUS-DIPHTH-ACELL PERTUSSIS 5-2.5-18.5 LF-MCG/0.5 IM SUSP: 0.5000 mL | Freq: Once | INTRAMUSCULAR | Status: DC

## 2017-01-11 MED ORDER — LORAZEPAM 2 MG/ML IJ SOLN
INTRAMUSCULAR | Status: AC
  Administered 2017-01-11: 1 mg
  Filled 2017-01-11: qty 1

## 2017-01-11 MED ORDER — SODIUM CHLORIDE 0.9 % IV BOLUS (SEPSIS)
1000.0000 mL | Freq: Once | INTRAVENOUS | Status: AC
Start: 2017-01-11 — End: 2017-01-12
  Administered 2017-01-11: 1000 mL via INTRAVENOUS

## 2017-01-11 MED ORDER — LIDOCAINE-EPINEPHRINE (PF) 2 %-1:200000 IJ SOLN
20.0000 mL | Freq: Once | INTRAMUSCULAR | Status: AC
  Administered 2017-01-11: 20 mL via INTRADERMAL
  Filled 2017-01-11: qty 20

## 2017-01-11 NOTE — H&P (Signed)
History and Physical  Patient Name: Noah Rose     TKK:[id number]    DOB: [birth year]    DOA: 01/11/2017 PCP: Steva Colder, MD  Patient coming from: Home  Chief Complaint: Seizure      HPI: Noah Rose is a 66 y.o. male with a past medical history significant for epilepsy, non-compliance with treatment, multiple drug intolerances, HTN and mild cognitive impairment who presents with seizure.  All history is collected from the patient's wife at the bedside as the patient is sedated from Dilantin.  The patient has long-standing noncompliance with Dilantin and has multiple medicine allergies/intolerances. Since his last hospitalization under the same circumstances, the patient was taking his Dilantin for a period of time, then relatively quickly per his wife, started taking Dilantin every other night. About 2 months ago he quit entirely. Since then he has had a seizure about every 2 weeks. Tonight he had an unwitnessed seizure, she found him on the ground bleeding from a head laceration and brought him to the ER.  ED course: -Afebrile, heart rate 112, respirations pulse ox normal, blood pressure initially 246/139 recorded -Na 139, K 3.7, Cr 1.35 (baseline 1.2), WBC 7.1K, Hgb 14.1 -Dilantin level undetectable -CT head unremarkable for age, no change from previous -ECG showed LVH, no change from previous -His head laceration was stapled -He was loaded with Dilantin, then had a seizure episode witnessed by staff -The load was continued, and Neurology were consulted who recommended observation ovenright          ROS: Review of Systems  Unable to perform ROS: Medical condition          Past Medical History:  Diagnosis Date  . Anxiety   . Dizziness   . High blood pressure   . Hypertension   . Multiple allergies   . Seizures ([place])     History reviewed. No pertinent surgical history.  Social History: Patient lives with his wife.  The patient walks unassisted.   Nonsmoker.  Allergies  Allergen Reactions  . Almond Oil Shortness Of Breath  . Corn Oil Shortness Of Breath  . Dextroamphetamine Shortness Of Breath    dizziness  . Dust Mite Mixed Allergen Ext [Mite (D. Farinae)] Shortness Of Breath  . Salicylates Shortness Of Breath and Other (See Comments)    Other reaction(s): Other (See Comments), Syncope  . Amlodipine     Other reaction(s): Other (See Comments) Other Reaction: PATIENT STATES HE CANNOT TOLER dizziness  . Amphetamines     dizziness  . Aspirin     Other reaction(s): Unknown dizziness  . Beet [Beta Vulgaris] Other (See Comments)    Congestion and sneezing  . Cantaloupe (Diagnostic) Other (See Comments)    Congestion and sneezing  . Carrot [Daucus Carota] Other (See Comments)    Congestion and sneezing  . Cheese     American cheddar  . Corn-Containing Products Other (See Comments)    Congestion and sneezing  . Felodipine     Other reaction(s): Other (See Comments) Other Reaction: PATIENT STATES HE CANNOT TOLER dizziness  . Fructose Other (See Comments)    Congestion and sneezing  . Green Dyes Other (See Comments)    #3 Congestion and sneezing  . Indapamide     Other reaction(s): Other (See Comments) Other Reaction: PATIENT STATES HE CANNOT TOLER dizziness  . Iodinated Diagnostic Agents Other (See Comments)    Other reaction(s): Unknown Uncoded Allergy. Allergen: Preservatives, Other Reaction: HTN CRISIS Uncoded Allergy. Allergen: ANTICONVULSANTS Uncoded  Allergy. Allergen: Preservatives, Other Reaction: HTN CRISIS Uncoded Allergy. Allergen: ANTICONVULSANTS  . Lambs Quarters     lamb  . Milk-Related Compounds Other (See Comments)    Congestion and sneezing  . Madelaine Bhat Isothiocyanate] Other (See Comments)    Congestion and sneezing  . Other Other (See Comments)    Other reaction(s): Unknown Uncoded Allergy. Allergen: ANTICONVULSANTS dizziness Other reaction(s): Unknown Uncoded Allergy. Allergen:  ALCOHOL COUGH MEDICINE Other reaction(s): Unknown Numerous food allergies A lot of preservatives and medicine ingredients  . Pineapple Other (See Comments)    Congestion and sneezing  . Plum Pulp Other (See Comments)    Congestion and sneezing  . Raspberry Other (See Comments)    Congestion and sneezing  . Red Dye Other (See Comments)    #40 Congestion and sneezing  . Salicylic Acid     Other reaction(s): Other (See Comments) Many issues  . Scallops [Shellfish Allergy] Other (See Comments)    Congestion and sneezing  . Tea Other (See Comments)    Congestion and sneezing  . Vanilla Other (See Comments)    Congestion and sneezing  . Whey Other (See Comments)    Congestion and sneezing    Family history: family history includes Dementia in his mother; Diabetes in his sister; Heart disease in his father; Leukemia in his sister; Pancreatic cancer in his sister; Parkinson's disease in his sister.  Prior to Admission medications   Medication Sig Start Date End Date Taking? Authorizing Provider  clonazePAM (KLONOPIN) 0.5 MG tablet Take 0.5 mg by mouth 2 (two) times daily as needed. For anxiety.    Yes [provider]  cloNIDine (CATAPRES) 0.1 MG tablet Take 0.1 mg by mouth 2 (two) times daily as needed (blood pressure 185-190).    Yes [provider]  finasteride (PROSCAR) 5 MG tablet Take 5 mg by mouth daily.   Yes [provider]  lisinopril (PRINIVIL,ZESTRIL) 20 MG tablet Take 7.5 mg by mouth.  03/01/16  Yes [provider]       Physical Exam: BP (!) 137/98   Pulse 87   Temp 98.2 F (36.8 C) (Oral)   Resp 15   Wt 86.2 kg (190 lb 0.6 oz)   SpO2 95%   BMI 28.06 kg/m  General appearance: Well-developed, adult male, sedated, asleep, snoring.   Eyes: Anicteric, conjunctiva pink, lids and lashes normal. PERRL.    ENT: No nasal deformity, discharge, epistaxis. OP moist without lesions.   Neck: No neck masses.  Trachea midline.  No  thyromegaly/tenderness. Lymph: No cervical or supraclavicular lymphadenopathy. Skin: Warm and dry.  No suspicious rashes or lesions. Cardiac: RRR, nl S1-S2, no murmurs appreciated.  Capillary refill is brisk. Radial pulses 2+ and symmetric. Respiratory: Normal respiratory rate and rhythm.  CTAB without rales or wheezes. Abdomen: Abdomen soft.  No TTP. No ascites, distension, hepatosplenomegaly.   MSK: No deformities or effusions.  No cyanosis or clubbing. Neuro: Sleeping.  Stirs occasionally.  Groans and shifts to painful stimuli.  Does not awake or follow commands. Psych: Unable to asses.s     Labs on Admission:  I have personally reviewed following labs and imaging studies: CBC:  Recent Labs Lab 01/11/17 1903  WBC 7.1  HGB 14.1  HCT 42.8  MCV 84.4  PLT 448   Basic Metabolic Panel:  Recent Labs Lab 01/11/17 1903  NA 139  K 3.7  CL 107  CO2 25  GLUCOSE 97  BUN 29*  CREATININE 1.35*  CALCIUM 8.7*  GFR: Estimated Creatinine Clearance: 57.8 mL/min (A) (by C-G formula based on SCr of 1.35 mg/dL (H)).  Liver Function Tests: No results for input(s): AST, ALT, ALKPHOS, BILITOT, PROT, ALBUMIN in the last 168 hours. No results for input(s): LIPASE, AMYLASE in the last 168 hours. No results for input(s): AMMONIA in the last 168 hours. Coagulation Profile: No results for input(s): INR, PROTIME in the last 168 hours. Cardiac Enzymes: No results for input(s): CKTOTAL, CKMB, CKMBINDEX, TROPONINI in the last 168 hours. BNP (last 3 results) No results for input(s): PROBNP in the last 8760 hours. HbA1C: No results for input(s): HGBA1C in the last 72 hours. CBG: No results for input(s): GLUCAP in the last 168 hours. Lipid Profile: No results for input(s): CHOL, HDL, LDLCALC, TRIG, CHOLHDL, LDLDIRECT in the last 72 hours. Thyroid Function Tests: No results for input(s): TSH, T4TOTAL, FREET4, T3FREE, THYROIDAB in the last 72 hours. Anemia Panel: No results for input(s):  VITAMINB12, FOLATE, FERRITIN, TIBC, IRON, RETICCTPCT in the last 72 hours. Sepsis Labs:  Invalid input(s): PROCALCITONIN, LACTICIDVEN No results found for this or any previous visit (from the past 240 hour(s)).       Radiological Exams on Admission: Personally reviewed CT head report: Ct Head Wo Contrast  Result Date: 01/11/2017 CLINICAL DATA:  Fall with questionable seizure.  Hypertension. EXAM: CT HEAD WITHOUT CONTRAST TECHNIQUE: Contiguous axial images were obtained from the base of the skull through the vertex without intravenous contrast. COMPARISON:  April 23, 2016 FINDINGS: Brain: The ventricles are normal in size and configuration. There is no intracranial mass, hemorrhage, extra-axial fluid collection, or midline shift. There is minimal periventricular small vessel disease in the centra semiovale bilaterally. Elsewhere gray-white compartments appear normal. No evident acute infarct. Vascular: There is no hyperdense vessel. There is calcification in each carotid siphon region. Skull: The bony calvarium appears intact. Sinuses/Orbits: There is mucosal thickening in the right maxillary antrum. There is opacification and mucosal thickening in several ethmoid air cells bilaterally. There is mucosal thickening in the anterior right frontal sinus. Other paranasal sinuses which are visualized are clear. Orbits appear symmetric bilaterally. Other: There is extensive pneumatization of the mastoid air cells. The mastoid air cells clear. IMPRESSION: Minimal periventricular small vessel disease. No intracranial mass, hemorrhage, or extra-axial fluid collection. Foci of arterial vascular calcification noted. Areas of paranasal sinus disease noted. Electronically Signed   By: Lowella Grip III M.D.   On: 01/11/2017 20:31    EKG: Independently reviewed. Rate 106, QTc 464, LVH, no significant change from previous.  Echocardiogram 2017: Report reviewed EF 55-60% Grade 2  DD         Assessment/Plan  1. Seizure:  Loaded with Dilantin in the ER.  Noncompliant.   -Per neurology  2. Hypertensive urgency:  Resolved by itself in the ER without treatment. -Continue lisinopril 7.5 mg daily  3. CKD III:  Stable.  4. Other medications:  -Hold finasteride       DVT prophylaxis: Lovenox  Code Status: FULL  Family Communication: Wife at bedside  Disposition Plan: [facility] consultation and further disposition per Neuro Consults called: Neurology, Dr. Cheral Marker Admission status: OBS At the point of initial evaluation, it is my clinical opinion that admission for OBSERVATION is reasonable and necessary because the patient's presenting complaints in the context of their chronic conditions represent sufficient risk of deterioration or significant morbidity to constitute reasonable grounds for close observation in the hospital setting, but that the patient may be medically stable for discharge from the  hospital within 24 to 48 hours.    Medical decision making: Patient seen at 11:20 PM on 01/11/2017.  The patient was discussed with Dr. Regenia Skeeter.  What exists of the patient's chart was reviewed in depth and summarized above.  Clinical condition: stable.        Edwin Dada [facility] Pager [phone]

## 2017-01-11 NOTE — ED Notes (Signed)
CBG 111 

## 2017-01-11 NOTE — ED Triage Notes (Signed)
Pt reports hx of seizures, but does not take his dilantin on regular basis. Pt felt dizzy, had possible seizure (unwitnessed) and hit back of head, has large laceration to back of head and bleeding controlled.

## 2017-01-11 NOTE — ED Provider Notes (Signed)
[facility] DEPT Provider Note   CSN: [id number] Arrival date & time: 01/11/17  1851     History   Chief Complaint Chief Complaint  Patient presents with  . Seizures  . Fall  . Laceration    HPI Noah Rose is a 66 y.o. male.  HPI  66 year old male with a history of prior seizures presents after a fall, seizure, and posterior head laceration. History is taken from the patient and wife at the bedside. Patient has not taken his Dilantin 3 weeks. He has a long-standing history of side effects or intolerance to multiple antiseizure medicines. He states that a lot of these seem to make his seizures worse or cause him to be short of breath or nauseated. He states that today he remembers feeling short of breath and dizzy, which his wife confirms our symptoms he often has before seizures. She heard him fall in another room and which went to check on him he was very stiff and mildly shaking. He has a history of partial seizures but sometimes has generalized seizures. He has a laceration to the back of his head but is not currently bleeding. Patient denies a current pain. No chest pain before, during, or after. Wife endorses that he was not quite himself for 40 minutes or so but now seems to be back to his normal. This is his typical postictal period. Unknown last tetanus immunization  Past Medical History:  Diagnosis Date  . Anxiety   . Dizziness   . High blood pressure   . Hypertension   . Multiple allergies   . Seizures [facility])     Patient Active Problem List   Diagnosis Date Noted  . CKD (chronic kidney disease), stage III 01/11/2017  . History of nonadherence to medical treatment 06/08/2016  . BPH (benign prostatic hyperplasia) 04/24/2016  . Delirium   . Hyponatremia 04/23/2016  . Microscopic hematuria 04/23/2016  . Urinary retention: Probable 03/17/2016  . Sinusitis, acute, maxillary: Right 03/16/2016  . Convulsions ([place]) 03/16/2016  . Facial fracture ([place]) 03/15/2016  .  Leukocytosis 03/15/2016  . Hypertensive urgency   . Orbital fracture ([place])   . Seizure ([place])   . Spells 06/03/2015  . Medication intolerance 04/03/2014  . Hypoglycemia 11/26/2013  . 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 Nasal turbinate hypertrophy 09/25/2013  . Nuclear sclerosis 05/08/2013  . Nasal congestion 04/15/2013  . Nocturia 04/02/2013  . Increased frequency of urination 04/02/2013  . Complex partial seizure ([place]) 11/05/2012  . Alteration consciousness 09/05/2012  . Syncope and collapse 07/28/2012  . Hypertension 07/28/2012  . Obstructive sleep apnea 07/28/2012  . Allergic rhinitis 07/28/2012  . Chronic anxiety 07/28/2012  . 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 Anxiety disorder 07/28/2012  . Dizziness 05/07/2012  . Breathing problem 01/21/2011  . Salicylate allergy 95/28/4132    History reviewed. No pertinent surgical history.     Home Medications    Prior to Admission medications   Medication Sig Start Date End Date Taking? Authorizing Provider  clonazePAM (KLONOPIN) 0.5 MG tablet Take 0.5 mg by mouth 2 (two) times daily as needed for anxiety.    Yes [provider]  cloNIDine (CATAPRES) 0.1 MG tablet Take 0.1 mg by mouth 2 (two) times daily as needed (blood pressure 185-190).    Yes [provider]  finasteride (PROSCAR) 5 MG tablet Take 5 mg by mouth daily.   Yes [provider]  fluticasone (  FLONASE) 50 MCG/ACT nasal spray Place 1-2 sprays into both nostrils daily as needed for allergies or rhinitis.   Yes [provider]  lisinopril (PRINIVIL,ZESTRIL) 20 MG tablet Take 7.5 mg by mouth.  03/01/16  Yes [provider]  Sodium Chloride-Sodium Bicarb (NETI POT SINUS [place] NA) Place 1 Applicatorful into the nose daily.   Yes [provider]    Family History Family History  Problem Relation Age of Onset  . Dementia  Mother   . Heart disease Father   . Parkinson's disease Sister   . Diabetes Sister   . Pancreatic cancer Sister   . Leukemia Sister     Social History Social History  Substance Use Topics  . Smoking status: Never Smoker  . Smokeless tobacco: Never Used  . Alcohol use No     Allergies   Almond oil; Corn oil; Dextroamphetamine; Dust mite mixed allergen ext [mite (d. farinae)]; Salicylates; Amlodipine; Amphetamines; Aspirin; Beet [beta vulgaris]; Cantaloupe (diagnostic); Carrot [daucus carota]; Cheese; Corn-containing products; Felodipine; Fructose; Green dyes; Indapamide; Iodinated diagnostic agents; Lambs quarters; Milk-related compounds; Mustard [allyl isothiocyanate]; Other; Pineapple; Plum pulp; Raspberry; Red dye; Salicylic acid; Scallops [shellfish allergy]; Tea; Vanilla; and Whey   Review of Systems Review of Systems  Respiratory: Positive for shortness of breath.   Skin: Positive for wound.  Neurological: Positive for dizziness and seizures.  All other systems reviewed and are negative.    Physical Exam Updated Vital Signs BP (!) 149/95   Pulse 86   Temp 98.2 F (36.8 C) (Oral)   Resp 20   Wt 86.2 kg (190 lb 0.6 oz)   SpO2 97%   BMI 28.06 kg/m   Physical Exam  Constitutional: He is oriented to person, place, and time. He appears well-developed and well-nourished.  HENT:  Head: Normocephalic.    Right Ear: External ear normal.  Left Ear: External ear normal.  Nose: Nose normal.  Eyes: Right eye exhibits no discharge. Left eye exhibits no discharge.  Neck: Neck supple.  Cardiovascular: Normal rate, regular rhythm and normal heart sounds.   Pulmonary/Chest: Effort normal and breath sounds normal.  Abdominal: Soft. There is no tenderness.  Musculoskeletal: He exhibits no edema.  Neurological: He is alert and oriented to person, place, and time.  Skin: Skin is warm and dry.  Nursing note and vitals reviewed.    ED Treatments / Results  Labs (all labs  ordered are listed, but only abnormal results are displayed) Labs Reviewed  PHENYTOIN LEVEL, TOTAL - Abnormal; Notable for the following:       Result Value   Phenytoin Lvl <2.5 (*)    All other components within normal limits  BASIC METABOLIC PANEL - Abnormal; Notable for the following:    BUN 29 (*)    Creatinine, Ser 1.35 (*)    Calcium 8.7 (*)    GFR calc non Af Amer 53 (*)    All other components within normal limits  CBC  CBG MONITORING, ED    EKG  EKG Interpretation  Date/Time:  Wednesday January 11 2017 18:56:43 EDT Ventricular Rate:  106 PR Interval:  154 QRS Duration: 96 QT Interval:  350 QTC Calculation: 464 R Axis:   46 Text Interpretation:  Sinus tachycardia with frequent Premature ventricular complexes Moderate voltage criteria for LVH, may be normal variant Borderline ECG PVCs new, otherwise no significant change since Oct 2017 Confirmed by Sherwood Gambler [id number]) on 01/11/2017 9:05:58 PM       Radiology Ct Head Wo Contrast  Result Date: 01/11/2017 CLINICAL DATA:  Fall with questionable seizure.  Hypertension. EXAM: CT HEAD WITHOUT CONTRAST TECHNIQUE: Contiguous axial images were obtained from the base of the skull through the vertex without intravenous contrast. COMPARISON:  April 23, 2016 FINDINGS: Brain: The ventricles are normal in size and configuration. There is no intracranial mass, hemorrhage, extra-axial fluid collection, or midline shift. There is minimal periventricular small vessel disease in the centra semiovale bilaterally. Elsewhere gray-white compartments appear normal. No evident acute infarct. Vascular: There is no hyperdense vessel. There is calcification in each carotid siphon region. Skull: The bony calvarium appears intact. Sinuses/Orbits: There is mucosal thickening in the right maxillary antrum. There is opacification and mucosal thickening in several ethmoid air cells bilaterally. There is mucosal thickening in the anterior right frontal sinus.  Other paranasal sinuses which are visualized are clear. Orbits appear symmetric bilaterally. Other: There is extensive pneumatization of the mastoid air cells. The mastoid air cells clear. IMPRESSION: Minimal periventricular small vessel disease. No intracranial mass, hemorrhage, or extra-axial fluid collection. Foci of arterial vascular calcification noted. Areas of paranasal sinus disease noted. Electronically Signed   By: Lowella Grip III M.D.   On: 01/11/2017 20:31    Procedures .Marland KitchenLaceration Repair Date/Time: 01/11/2017 10:58 PM Performed by: Sherwood Gambler Authorized by: Sherwood Gambler   Consent:    Consent obtained:  Verbal   Consent given by:  Spouse Anesthesia (see MAR for exact dosages):    Anesthesia method:  Local infiltration   Local anesthetic:  Lidocaine 2% WITH epi Laceration details:    Location:  Scalp   Scalp location:  Occipital   Length (cm):  4 Repair type:    Repair type:  Simple Pre-procedure details:    Preparation:  Patient was prepped and draped in usual sterile fashion and imaging obtained to evaluate for foreign bodies Skin repair:    Repair method:  Staples   Number of staples:  5 Approximation:    Approximation:  Close   Vermilion border: well-aligned   Post-procedure details:    Dressing:  Antibiotic ointment   Patient tolerance of procedure:  Tolerated well, no immediate complications   (including critical care time)  Medications Ordered in ED Medications  Tdap (BOOSTRIX) injection 0.5 mL (0.5 mLs Intramuscular Not Given 01/11/17 2224)  sodium chloride 0.9 % bolus 1,000 mL (0 mLs Intravenous Stopped 01/12/17 0035)  phenytoin (DILANTIN) 1,500 mg in sodium chloride 0.9 % 250 mL IVPB (0 mg Intravenous Stopped 01/11/17 2320)  lidocaine-EPINEPHrine (XYLOCAINE W/EPI) 2 %-1:200000 (PF) injection 20 mL (20 mLs Intradermal Given by Other 01/11/17 2153)  clonazePAM (KLONOPIN) tablet 0.5 mg (0.5 mg Oral Given 01/11/17 2153)  LORazepam (ATIVAN) 2 MG/ML  injection (1 mg  Given 01/11/17 2208)     Initial Impression / Assessment and Plan / ED Course  I have reviewed the triage vital signs and the nursing notes.  Pertinent labs & imaging results that were available during my care of the patient were reviewed by me and considered in my medical decision making (see chart for details).     Patient is noncompliant, which appears to be the cause of the seizure today. Had a recurrent seizure while in ED while being set up for dilantin load. Is noncompliant due to him believing there are adverse outcomes associated with the anti-epileptics. Wife endorses these frequently come in clusters. In between seizures he is awake, appropriate. No fevers. Doubt CNS infection. CT without acute intra-cranial trauma. Lac repaired after cleaning. Tdap updated. Consult neuro, admit  to hospitalist. He was given 1 mg IV ativan for seizure in ED.   Final Clinical Impressions(s) / ED Diagnoses   Final diagnoses:  Seizures ([place])  Occipital scalp laceration, initial encounter    New Prescriptions New Prescriptions   No medications on file     Sherwood Gambler, MD 01/12/17 [phone]

## 2017-01-11 NOTE — ED Notes (Signed)
Pt tachycardic, found to have twitching to R arm, drooling, unresponsive lasting ~45 seconds. Pt combative, attempting to climb out of bed and remove cardiac monitoring wires. Wife at bedside. Dr.Goldston at bedside

## 2017-01-11 NOTE — ED Notes (Signed)
Dr. Danford at bedside  

## 2017-01-11 NOTE — ED Notes (Signed)
Patient has urine sample in mini lab, if needed

## 2017-01-12 ENCOUNTER — Encounter (HOSPITAL_COMMUNITY): Payer: Self-pay | Admitting: General Practice

## 2017-01-12 ENCOUNTER — Observation Stay (HOSPITAL_COMMUNITY): Payer: Non-veteran care

## 2017-01-12 DIAGNOSIS — Z888 Allergy status to other drugs, medicaments and biological substances status: Secondary | ICD-10-CM | POA: Diagnosis not present

## 2017-01-12 DIAGNOSIS — G40209 Localization-related (focal) (partial) symptomatic epilepsy and epileptic syndromes with complex partial seizures, not intractable, without status epilepticus: Secondary | ICD-10-CM | POA: Diagnosis present

## 2017-01-12 DIAGNOSIS — R42 Dizziness and giddiness: Secondary | ICD-10-CM | POA: Diagnosis present

## 2017-01-12 DIAGNOSIS — F419 Anxiety disorder, unspecified: Secondary | ICD-10-CM

## 2017-01-12 DIAGNOSIS — N183 Chronic kidney disease, stage 3 (moderate): Secondary | ICD-10-CM | POA: Diagnosis present

## 2017-01-12 DIAGNOSIS — R569 Unspecified convulsions: Secondary | ICD-10-CM

## 2017-01-12 DIAGNOSIS — I160 Hypertensive urgency: Secondary | ICD-10-CM

## 2017-01-12 DIAGNOSIS — S0101XA Laceration without foreign body of scalp, initial encounter: Secondary | ICD-10-CM | POA: Diagnosis present

## 2017-01-12 DIAGNOSIS — Z9114 Patient's other noncompliance with medication regimen: Secondary | ICD-10-CM | POA: Diagnosis not present

## 2017-01-12 DIAGNOSIS — I129 Hypertensive chronic kidney disease with stage 1 through stage 4 chronic kidney disease, or unspecified chronic kidney disease: Secondary | ICD-10-CM | POA: Diagnosis present

## 2017-01-12 DIAGNOSIS — R0602 Shortness of breath: Secondary | ICD-10-CM | POA: Diagnosis present

## 2017-01-12 DIAGNOSIS — F0390 Unspecified dementia without behavioral disturbance: Secondary | ICD-10-CM | POA: Diagnosis present

## 2017-01-12 DIAGNOSIS — Z9119 Patient's noncompliance with other medical treatment and regimen: Secondary | ICD-10-CM | POA: Diagnosis not present

## 2017-01-12 DIAGNOSIS — W19XXXA Unspecified fall, initial encounter: Secondary | ICD-10-CM | POA: Diagnosis present

## 2017-01-12 DIAGNOSIS — Z79899 Other long term (current) drug therapy: Secondary | ICD-10-CM | POA: Diagnosis not present

## 2017-01-12 DIAGNOSIS — Z9102 Food additives allergy status: Secondary | ICD-10-CM | POA: Diagnosis not present

## 2017-01-12 DIAGNOSIS — R9401 Abnormal electroencephalogram [EEG]: Secondary | ICD-10-CM | POA: Diagnosis present

## 2017-01-12 DIAGNOSIS — Z886 Allergy status to analgesic agent status: Secondary | ICD-10-CM | POA: Diagnosis not present

## 2017-01-12 DIAGNOSIS — Z91018 Allergy to other foods: Secondary | ICD-10-CM | POA: Diagnosis not present

## 2017-01-12 DIAGNOSIS — G4730 Sleep apnea, unspecified: Secondary | ICD-10-CM | POA: Diagnosis present

## 2017-01-12 LAB — GLUCOSE, CAPILLARY: GLUCOSE-CAPILLARY: 111 mg/dL — AB (ref 65–99)

## 2017-01-12 LAB — MAGNESIUM: Magnesium: 2.4 mg/dL (ref 1.7–2.4)

## 2017-01-12 MED ORDER — ACETAMINOPHEN 650 MG RE SUPP
650.0000 mg | Freq: Four times a day (QID) | RECTAL | Status: DC | PRN
Start: 2017-01-12 — End: 2017-01-13

## 2017-01-12 MED ORDER — PHENYTOIN SODIUM 50 MG/ML IJ SOLN: 100.0000 mg | Freq: Three times a day (TID) | INTRAMUSCULAR | Status: DC

## 2017-01-12 MED ORDER — ENOXAPARIN SODIUM 40 MG/0.4ML ~~LOC~~ SOLN
40.0000 mg | SUBCUTANEOUS | Status: DC
  Administered 2017-01-13: 40 mg via SUBCUTANEOUS
  Filled 2017-01-12: qty 0.4

## 2017-01-12 MED ORDER — PHENYTOIN SODIUM EXTENDED 100 MG PO CAPS: 300.0000 mg | ORAL_CAPSULE | Freq: Every day | ORAL | Status: DC

## 2017-01-12 MED ORDER — ACETAMINOPHEN 325 MG PO TABS: 650.0000 mg | ORAL_TABLET | Freq: Four times a day (QID) | ORAL | Status: DC | PRN

## 2017-01-12 MED ORDER — LISINOPRIL 5 MG PO TABS
7.5000 mg | ORAL_TABLET | Freq: Every day | ORAL | Status: DC
  Administered 2017-01-12 – 2017-01-13 (×2): 7.5 mg via ORAL
  Filled 2017-01-12 (×2): qty 1

## 2017-01-12 MED ORDER — CLONAZEPAM 0.5 MG PO TABS
0.5000 mg | ORAL_TABLET | Freq: Two times a day (BID) | ORAL | Status: DC
  Administered 2017-01-12 – 2017-01-13 (×3): 0.5 mg via ORAL
  Filled 2017-01-12 (×3): qty 1

## 2017-01-12 MED ORDER — HYDRALAZINE HCL 20 MG/ML IJ SOLN
10.0000 mg | INTRAMUSCULAR | Status: DC | PRN
  Administered 2017-01-12: 10 mg via INTRAVENOUS
  Filled 2017-01-12: qty 1

## 2017-01-12 MED ORDER — PHENYTOIN SODIUM EXTENDED 100 MG PO CAPS
300.0000 mg | ORAL_CAPSULE | Freq: Every day | ORAL | Status: DC
  Filled 2017-01-12: qty 3

## 2017-01-12 NOTE — Progress Notes (Signed)
EEG Completed; Results Pending  

## 2017-01-12 NOTE — ED Notes (Signed)
Pt's bed drenched in urine. Pt alert but remains drowsy. Bed linens changed x 2. Condom cath placed with continued urinary output

## 2017-01-12 NOTE — ED Notes (Signed)
Attempted to call report.  No answer on unit. 

## 2017-01-12 NOTE — Progress Notes (Signed)
PROGRESS NOTE    CEASAR Rose  WJX:[id number] DOB: [birth year] DOA: 01/11/2017 PCP: Steva Colder, MD   Brief Narrative: Noah Rose is a 66 y.o. male with a past medical history significant for epilepsy, non-compliance with treatment, multiple drug intolerances, HTN and mild cognitive impairment who presents with seizure.  Assessment & Plan:   Principal Problem:   Seizure ([place]) Active Problems:   Chronic anxiety   Hypertensive urgency   History of nonadherence to medical treatment   CKD (chronic kidney disease), stage III   Breakthrough seizures secondary to medication non compliance:  Loaded with dilantin in ED, he is seizure free since then.  He is alert this am and able to answer some questions.  EEG done and results pending.  Neurology consulted and recommendations given.  Resume dilantin 300 mg daily and encourage compliance. Discussed with wife today.  Dilantin level ordered , to see if he needs an extra dose of dilantin ,before resuming his home dose.    Stage 3 CKD:  Creatinine at baseline.    Chronic anxiety disorder:  Resume clonazepam.    Hypertensive urgency:  - restart home meds and started him on prn hydralazine.    DVT prophylaxis: (Lovenox) Code Status: (Full/) Family Communication: discussed with wife at bedside.  Disposition Plan: pending EEG results and PT evaluation.    Consultants:   neurology.    Procedures: CT head. EEG pending.    Antimicrobials: none.    Subjective: Reports being uncomfortable with urination.  No headache or dizziness.  Knows he is in the hospital.    Objective: Vitals:   01/12/17 0530 01/12/17 0600 01/12/17 0730 01/12/17 0800  BP: (!) 164/94 (!) 172/100 (!) 157/106 (!) 178/103  Pulse: 73 71 75 75  Resp: 12 13 13 13   Temp:      TempSrc:      SpO2: 96% 97% 99% 97%  Weight:        Intake/Output Summary (Last 24 hours) at 01/12/17 0856 Last data filed at 01/12/17 0510  Gross per 24 hour    Intake             1000 ml  Output              975 ml  Net               25 ml   Filed Weights   01/11/17 2100  Weight: 86.2 kg (190 lb 0.6 oz)    Examination:  General exam: Appears calm and comfortable  Respiratory system: Clear to auscultation. Respiratory effort normal. Cardiovascular system: S1 & S2 heard, RRR. No JVD, murmurs, rubs, gallops or clicks. No pedal edema. Gastrointestinal system: Abdomen is nondistended, soft and nontender. No organomegaly or masses felt. Normal bowel sounds heard. Central nervous system: Alert and oriented. No focal neurological deficits. Extremities: Symmetric 5 x 5 power. Skin: No rashes, lesions or ulcers Psychiatry: Judgement and insight appear normal. Mood & affect appropriate.     Data Reviewed: I have personally reviewed following labs and imaging studies  CBC:  Recent Labs Lab 01/11/17 1903  WBC 7.1  HGB 14.1  HCT 42.8  MCV 84.4  PLT 213   Basic Metabolic Panel:  Recent Labs Lab 01/11/17 1903  NA 139  K 3.7  CL 107  CO2 25  GLUCOSE 97  BUN 29*  CREATININE 1.35*  CALCIUM 8.7*  MG 2.4   GFR: Estimated Creatinine Clearance: 57.8 mL/min (A) (by C-G formula based on SCr  of 1.35 mg/dL (H)). Liver Function Tests: No results for input(s): AST, ALT, ALKPHOS, BILITOT, PROT, ALBUMIN in the last 168 hours. No results for input(s): LIPASE, AMYLASE in the last 168 hours. No results for input(s): AMMONIA in the last 168 hours. Coagulation Profile: No results for input(s): INR, PROTIME in the last 168 hours. Cardiac Enzymes: No results for input(s): CKTOTAL, CKMB, CKMBINDEX, TROPONINI in the last 168 hours. BNP (last 3 results) No results for input(s): PROBNP in the last 8760 hours. HbA1C: No results for input(s): HGBA1C in the last 72 hours. CBG: No results for input(s): GLUCAP in the last 168 hours. Lipid Profile: No results for input(s): CHOL, HDL, LDLCALC, TRIG, CHOLHDL, LDLDIRECT in the last 72 hours. Thyroid  Function Tests: No results for input(s): TSH, T4TOTAL, FREET4, T3FREE, THYROIDAB in the last 72 hours. Anemia Panel: No results for input(s): VITAMINB12, FOLATE, FERRITIN, TIBC, IRON, RETICCTPCT in the last 72 hours. Sepsis Labs: No results for input(s): PROCALCITON, LATICACIDVEN in the last 168 hours.  No results found for this or any previous visit (from the past 240 hour(s)).       Radiology Studies: Ct Head Wo Contrast  Result Date: 01/11/2017 CLINICAL DATA:  Fall with questionable seizure.  Hypertension. EXAM: CT HEAD WITHOUT CONTRAST TECHNIQUE: Contiguous axial images were obtained from the base of the skull through the vertex without intravenous contrast. COMPARISON:  April 23, 2016 FINDINGS: Brain: The ventricles are normal in size and configuration. There is no intracranial mass, hemorrhage, extra-axial fluid collection, or midline shift. There is minimal periventricular small vessel disease in the centra semiovale bilaterally. Elsewhere gray-white compartments appear normal. No evident acute infarct. Vascular: There is no hyperdense vessel. There is calcification in each carotid siphon region. Skull: The bony calvarium appears intact. Sinuses/Orbits: There is mucosal thickening in the right maxillary antrum. There is opacification and mucosal thickening in several ethmoid air cells bilaterally. There is mucosal thickening in the anterior right frontal sinus. Other paranasal sinuses which are visualized are clear. Orbits appear symmetric bilaterally. Other: There is extensive pneumatization of the mastoid air cells. The mastoid air cells clear. IMPRESSION: Minimal periventricular small vessel disease. No intracranial mass, hemorrhage, or extra-axial fluid collection. Foci of arterial vascular calcification noted. Areas of paranasal sinus disease noted. Electronically Signed   By: Lowella Grip III M.D.   On: 01/11/2017 20:31        Scheduled Meds: . clonazePAM  0.5 mg Oral BID    . enoxaparin (LOVENOX) injection  40 mg Subcutaneous Q24H  . lisinopril  7.5 mg Oral Daily  . phenytoin  300 mg Oral QHS  . Tdap  0.5 mL Intramuscular Once   Continuous Infusions:   LOS: 0 days    Time spent: [place], MD [facility] Pager [phone]  If 7PM-7AM, please contact night-coverage [email or web address] Password [facility] 01/12/2017, 8:56 AM

## 2017-01-12 NOTE — ED Notes (Signed)
Pt. Transported to EEG. Pt. To be transported to 79M rm 17 afterwards. Pt. Med surg. Pt. Floor RN made aware and approved.

## 2017-01-12 NOTE — Progress Notes (Signed)
Subjective: Currently patient is awake and oriented. He is not a great historian however wife at bedside is slightly better history. He has been seen by multiple neurologists in the past. Looking back at [facility] care everywhere Dr. Lynnell Dike notes" history of htn, sleep apnea, and seizures who returns for follow up. Patient is tried and failed numerous medications including Keppra, onfi, Depakote, Vimpat, phenobarbital, Valium and Klonopin." Per wife he states that he does not like the feeling that any of these antiepileptics have been giving him and instead of titrating off he will quickly stop taking any of the medications which then puts him in a bad situation in which he has seizures. Currently he is no longer having seizures. At this point I have ordered a Dilantin level as his received a Dilantin load 11 hours prior. His next scheduled dose of 300 mg of Dilantin is tonight. We'll follow Dilantin level with correction of albumin and make adjustments as needed.  Discussed with Dr. Karleen Hampshire.  Continue to follow  Etta Quill PA-C [facility] [phone]  M-F  (8:30 am- 4 PM)  01/12/2017, 9:52 AM

## 2017-01-12 NOTE — ED Notes (Signed)
Pt. Alert to self, place, situation, and states the month. Unable to report the year or president.

## 2017-01-12 NOTE — Procedures (Signed)
ELECTROENCEPHALOGRAM REPORT  Date of Study: 01/12/2017  Patient's Name: Noah Rose MRN: [id number] Date of Birth: October 03, 1950  Referring Provider: Dr. Kerney Elbe  Clinical History: This is a 66 year old man with a history of seizures, admitted for breakthrough seizures  Medications: phenytoin (DILANTIN) ER capsule 300 mg  clonazePAM (KLONOPIN) tablet 0.5 mg  acetaminophen (TYLENOL) tablet 650 mg  enoxaparin (LOVENOX) injection 40 mg  lisinopril (PRINIVIL,ZESTRIL) tablet 7.5 mg   Technical Summary: A multichannel digital EEG recording measured by the international 10-20 system with electrodes applied with paste and impedances below 5000 ohms performed in our laboratory with EKG monitoring in an awake and asleep patient.  Hyperventilation and photic stimulation were not performed.  The digital EEG was referentially recorded, reformatted, and digitally filtered in a variety of bipolar and referential montages for optimal display.    Description: The patient is awake and asleep during the recording.  During maximal wakefulness, there is a symmetric, medium voltage 9 Hz posterior dominant rhythm that attenuates with eye opening.  There is occasional independent focal 4-5 Hz theta slowing seen over the bilateral temporal regions.  During drowsiness and sleep, there is an increase in theta slowing of the background.  Vertex waves and symmetric sleep spindles were seen.  Hyperventilation and photic stimulation were not done. There were frequent left frontotemporal sharp waves seen. There were occasional independent sharp waves over the right frontotemporal region. There were no electrographic seizures seen.    EKG lead was unremarkable.  Impression: This awake and asleep EEG is abnormal due to the presence of: 1. Independent focal slowing over the bilateral temporal regions 2. Frequent epileptiform discharges over the left frontotemporal region 3. Occasional independent epileptiform  discharges over the right frontotemporal region  Clinical Correlation of the above findings indicates focal cerebral dysfunction over the bilateral temporal regions suggestive of underlying structural or physiologic abnormality. There is a tendency for seizures to arise from the bilateral temporal regions. More frequent epileptiform discharges were seen from the left frontotemporal region. There were no electrographic seizures in this study.  Clinical correlation is advised.   Ellouise Newer, M.D.

## 2017-01-12 NOTE — ED Notes (Signed)
Dr. Lindzen at bedside. 

## 2017-01-12 NOTE — Progress Notes (Signed)
New Admission Note:  Arrival Method: from ER on Stretcher  Mental Orientation: alert & oriented x 4  Telemetry: n/a Assessment: Completed Skin: assessment complete  IV: R AC  Pain:0/10 Safety Measures: Safety Fall Prevention Plan was given, discussed. Admission: Completed 5M17: Patient has been orientated to the room, unit and the staff. Family: wife at bedside   Orders have been reviewed and implemented. Will continue to monitor the patient. Call light has been placed within reach and bed alarm has been activated.   Arta Silence ,RN

## 2017-01-12 NOTE — Consult Note (Signed)
NEURO HOSPITALIST CONSULT NOTE   Requestig physician: Dr. Loleta Books  Reason for Consult: Increasing seizure frequency in the context of medication noncompliance  History obtained from:  Wife and Chart     HPI:                                                                                                                                          Noah Rose is an 66 y.o. male with a longstanding history of partial complex seizures who presents with seizure recurrence in the context of noncompliance with Dilantin. He had two seizures today, one at home and one while in the ED. He was loaded with Dilantin in the ED after his level came back at < 2.5.   The patient is postictal and unable to furnish a history. His wife states that he has had seizures for at least 43 years, ever since she met him. He has been tried on multiple seizure medications, including Onfi and Keppra, neither of which were effective for suppressing his seizures. She states that the longest period of time during which he was seizure free was 1.5 years, about 19 years ago, when he was taking Xanax on a daily basis; at that time he was not on any other seizure medications. After stopping Xanax, his seizures began occurring again, but for some reason he did not restart this medication. He has been on klonopin PRN in conjunction with Dilantin, and his wife feels that this combination gave the best seizure control outside of the Xanax regimen described above.   His seizure frequency was about once every 3 weeks while on extended-release Dilantin 300 mg qd, until about February of this year, when he started taking his Dilantin every other day. His wife states he did this against medical advice because he feels that it makes him feel "not quite right". His seizure frequency began increasing. He then discontinued the Dilantin entirely in May and seizures started to occur even more frequently, on average about once per  week.   His typical seizure semiology is as follows: First manifestation is appearing to be dazed, followed by flexion, adduction and internal rotation of his RUE, followed by slumping to the right and then brief clonic activity. The seizures usually last about 30 seconds. Following his seizures, he has usually been postictal for 15-45 minutes, but the duration has increased as he has aged.   He sustained a posterior scalp laceration due to falling during his seizure at home. The laceration has been sutured in the ED.   His wife endorses gradually worsening behavioral changes and increasing forgetfulness.   Past Medical History:  Diagnosis Date  . Anxiety   . Dizziness   . High blood pressure   . Hypertension   . Multiple  allergies   . Seizures ([place])     History reviewed. No pertinent surgical history.  Family History  Problem Relation Age of Onset  . Dementia Mother   . Heart disease Father   . Parkinson's disease Sister   . Diabetes Sister   . Pancreatic cancer Sister   . Leukemia Sister    Social History:  reports that he has never smoked. He has never used smokeless tobacco. He reports that he does not drink alcohol or use drugs.  Allergies  Allergen Reactions  . Almond Oil Shortness Of Breath  . Corn Oil Shortness Of Breath  . Dextroamphetamine Shortness Of Breath    dizziness  . Dust Mite Mixed Allergen Ext [Mite (D. Farinae)] Shortness Of Breath  . Salicylates Shortness Of Breath and Other (See Comments)    Other reaction(s): Other (See Comments), Syncope  . Amlodipine     Other reaction(s): Other (See Comments) Other Reaction: PATIENT STATES HE CANNOT TOLER dizziness  . Amphetamines     dizziness  . Aspirin     Other reaction(s): Unknown dizziness  . Beet [Beta Vulgaris] Other (See Comments)    Congestion and sneezing  . Cantaloupe (Diagnostic) Other (See Comments)    Congestion and sneezing  . Carrot [Daucus Carota] Other (See Comments)    Congestion and  sneezing  . Cheese     American cheddar  . Corn-Containing Products Other (See Comments)    Congestion and sneezing  . Felodipine     Other reaction(s): Other (See Comments) Other Reaction: PATIENT STATES HE CANNOT TOLER dizziness  . Fructose Other (See Comments)    Congestion and sneezing  . Green Dyes Other (See Comments)    #3 Congestion and sneezing  . Indapamide     Other reaction(s): Other (See Comments) Other Reaction: PATIENT STATES HE CANNOT TOLER dizziness  . Iodinated Diagnostic Agents Other (See Comments)    Other reaction(s): Unknown Uncoded Allergy. Allergen: Preservatives, Other Reaction: HTN CRISIS Uncoded Allergy. Allergen: ANTICONVULSANTS Uncoded Allergy. Allergen: Preservatives, Other Reaction: HTN CRISIS Uncoded Allergy. Allergen: ANTICONVULSANTS  . Lambs Quarters     lamb  . Milk-Related Compounds Other (See Comments)    Congestion and sneezing  . Madelaine Bhat Isothiocyanate] Other (See Comments)    Congestion and sneezing  . Other Other (See Comments)    Other reaction(s): Unknown Uncoded Allergy. Allergen: ANTICONVULSANTS dizziness Other reaction(s): Unknown Uncoded Allergy. Allergen: ALCOHOL COUGH MEDICINE Other reaction(s): Unknown Numerous food allergies A lot of preservatives and medicine ingredients  . Pineapple Other (See Comments)    Congestion and sneezing  . Plum Pulp Other (See Comments)    Congestion and sneezing  . Raspberry Other (See Comments)    Congestion and sneezing  . Red Dye Other (See Comments)    #40 Congestion and sneezing  . Salicylic Acid     Other reaction(s): Other (See Comments) Many issues  . Scallops [Shellfish Allergy] Other (See Comments)    Congestion and sneezing  . Tea Other (See Comments)    Congestion and sneezing  . Vanilla Other (See Comments)    Congestion and sneezing  . Whey Other (See Comments)    Congestion and sneezing    MEDICATIONS:  Dilantin extended release (Phenytek) 300 mg po qd - noncompliant Klonopin 0.5 mg BID PRN anxiety Clonidine Proscar Flonase Lisinopril  ROS:                                                                                                                                       Unable to obtain due to postictal state  Blood pressure (!) 160/90, pulse 89, temperature 98.2 F (36.8 C), temperature source Oral, resp. rate 17, weight 86.2 kg (190 lb 0.6 oz), SpO2 95 %.  HEENT: Sutured posterior scalp laceration Lungs: No tachypnea or use of accessory muscles of respiration Ext: Warm and well perfused  Neurological Examination Mental Status: Postictal stupor. Does not answer questions. Partially followed one command briefly after transient arousal with vigorous tactile stimulation.  Cranial Nerves: II: Unable to remain awake for visual field testing. PERRL.  III,IV, VI: Does not remain awake for testing of EOM. Does not track with eyes held open. No nystagmus noted.  V,VII: Face flaccid in the context of postictal state. No asymmetry noted. Reacts to tactile stimuli bilaterally.  VIII: Semi-arousal to voice IX,X: Occasional snoring respirations XI: No gross asymmetry XII: Unable to assess Motor/Sensory: Moves all 4 extremities equally to noxious stimuli.  Deep Tendon Reflexes:  4+ right biceps, 3+ left biceps 4+ left patella, 3+ right patella. 2+ achilles bilaterally. Plantars: Right: downgoing   Left: upgoing Cerebellar: Unable to perform FNF adequately for assessment.  Gait: Deferred  Lab Results: Basic Metabolic Panel:  Recent Labs Lab 01/11/17 1903  NA 139  K 3.7  CL 107  CO2 25  GLUCOSE 97  BUN 29*  CREATININE 1.35*  CALCIUM 8.7*    Liver Function Tests: No results for input(s): AST, ALT, ALKPHOS, BILITOT, PROT, ALBUMIN in the last 168 hours. No results for input(s): LIPASE, AMYLASE in the last 168  hours. No results for input(s): AMMONIA in the last 168 hours.  CBC:  Recent Labs Lab 01/11/17 1903  WBC 7.1  HGB 14.1  HCT 42.8  MCV 84.4  PLT 171    Cardiac Enzymes: No results for input(s): CKTOTAL, CKMB, CKMBINDEX, TROPONINI in the last 168 hours.  Lipid Panel: No results for input(s): CHOL, TRIG, HDL, CHOLHDL, VLDL, LDLCALC in the last 168 hours.  CBG: No results for input(s): GLUCAP in the last 168 hours.  Microbiology: Results for orders placed or performed in visit on 07/26/16  Microscopic Examination     Status: Abnormal   Collection Time: 07/26/16 10:31 AM  Result Value Ref Range Status   WBC, UA 0-5 0 - 5 /hpf Final   RBC, UA 0-2 0 - 2 /hpf Final   Epithelial Cells (non renal) None seen 0 - 10 /hpf Final   Crystals Present (A) N/A Final   Crystal Type Amorphous Sediment N/A Final   Bacteria, UA None seen None seen/Few Final    Coagulation Studies: No results for  input(s): LABPROT, INR in the last 72 hours.  Imaging: Ct Head Wo Contrast  Result Date: 01/11/2017 CLINICAL DATA:  Fall with questionable seizure.  Hypertension. EXAM: CT HEAD WITHOUT CONTRAST TECHNIQUE: Contiguous axial images were obtained from the base of the skull through the vertex without intravenous contrast. COMPARISON:  April 23, 2016 FINDINGS: Brain: The ventricles are normal in size and configuration. There is no intracranial mass, hemorrhage, extra-axial fluid collection, or midline shift. There is minimal periventricular small vessel disease in the centra semiovale bilaterally. Elsewhere gray-white compartments appear normal. No evident acute infarct. Vascular: There is no hyperdense vessel. There is calcification in each carotid siphon region. Skull: The bony calvarium appears intact. Sinuses/Orbits: There is mucosal thickening in the right maxillary antrum. There is opacification and mucosal thickening in several ethmoid air cells bilaterally. There is mucosal thickening in the anterior  right frontal sinus. Other paranasal sinuses which are visualized are clear. Orbits appear symmetric bilaterally. Other: There is extensive pneumatization of the mastoid air cells. The mastoid air cells clear. IMPRESSION: Minimal periventricular small vessel disease. No intracranial mass, hemorrhage, or extra-axial fluid collection. Foci of arterial vascular calcification noted. Areas of paranasal sinus disease noted. Electronically Signed   By: Lowella Grip III M.D.   On: 01/11/2017 20:31    Assessment: 66 year old male with breakthrough seizures secondary to medication noncompliance 1. 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 Most likely due to a seizure focus within the left cerebral hemisphere.  2. Mild dementia is likely given wife's description of behavioral changes and increasing forgetfulness.   Recommendations: 1. Agree with Dilantin load. Restart scheduled dosing with phenytoin 300 mg qd (ordered). Encourage compliance.  2. For improved seizure control, would change PRN clonazepam (currently prescribed for anxiety) to scheduled dosing at 0.5 mg BID (ordered). This can be escalated with caution given that he may have early stage dementia. Overall benefits to the patient with chances for improved seizure control are felt to outweigh risk of impaired cognition related to sedating effects.  3. Consider a trial of Aricept 4. The patient is not to drive. Per [facility] statutes, patients with seizures are not allowed to drive until  they have been seizure-free for six months. Use caution when using heavy equipment or power tools. Avoid working on ladders or at heights. Take showers instead of baths. Ensure the water temperature is not too high on the home water heater. Do not go swimming alone. When caring for infants or small children, sit down when holding, feeding, or changing them to minimize risk of injury to the child in the event you have  a seizure.  5. Seizure precautions (ordered) 6. Magnesium level (ordered). 7. EEG (ordered)   Electronically signed: Dr. Kerney Elbe 01/12/2017, 12:42 AM

## 2017-01-13 DIAGNOSIS — Z9119 Patient's noncompliance with other medical treatment and regimen: Secondary | ICD-10-CM

## 2017-01-13 LAB — PHENYTOIN LEVEL, TOTAL: Phenytoin Lvl: 16.4 ug/mL (ref 10.0–20.0)

## 2017-01-13 MED ORDER — PHENYTOIN SODIUM EXTENDED 300 MG PO CAPS: 300.0000 mg | ORAL_CAPSULE | Freq: Every day | ORAL | 1 refills | Status: DC

## 2017-01-13 NOTE — Care Management Note (Addendum)
Case Management Note  Patient Details  Name: Noah Rose MRN: [id number] Date of Birth: May 25, 1951  Subjective/Objective:   Pt admitted with seizure              Action/Plan:  PTA independent from home with wife - pt states he is not interested in DME as recommended.  Pt would be interested in [place] - choice given and [facility] chose - agency contacted and agency accepted referral.  Pt has PCP at [facility] and denies barriers to obtaining medications.   Wife voiced concern with pt being more sleepy than usual  - CM relayed concern directly to bedside nurse.   Expected Discharge Date:  01/13/17               Expected Discharge Plan:  [place]  In-House Referral:     Discharge planning Services  CM Consult  Post Acute Care Choice:    Choice offered to:  Patient  DME Arranged:    DME Agency:     HH Arranged:  PT, RN [place] Agency:  [place]  Status of Service:  In process, will continue to follow  If discussed at Long Length of Stay Meetings, dates discussed:    Additional Comments:  Maryclare Labrador, RN 01/13/2017, 4:00 PM

## 2017-01-13 NOTE — Progress Notes (Signed)
Patient d/c home. no new concerns, d/c instructions done with teach back, pt/family verbalize understanding. Pt transported out of hospital by spouse.

## 2017-01-13 NOTE — Progress Notes (Signed)
Subjective: He is slightly drowsy but no other complaints.   Exam: Vitals:   01/13/17 0048 01/13/17 0512  BP: (!) 156/98 (!) 159/93  Pulse: 75 69  Resp: 20 20  Temp: 98.4 F (36.9 C) 97.9 F (36.6 C)    HEENT-  Normocephalic, no lesions, without obvious abnormality.  Normal external eye and conjunctiva.  Normal TM's bilaterally.  Normal auditory canals and external ears. Normal external nose, mucus membranes and septum.  Normal pharynx.    Neuro: drowsy CN: Pupils are equal and round. They are symmetrically reactive from 3-->2 mm. EOMI without nystagmus. Facial sensation is intact to light touch. Face is symmetric at rest with normal strength and mobility. Hearing is intact to conversational voice. Palate elevates symmetrically and uvula is midline. Voice is normal in tone, pitch and quality. Bilateral SCM and trapezii are 5/5. Tongue is midline with normal bulk and mobility.  Motor: Normal bulk, tone, and strength. 5/5 throughout. No drift.  Sensation: Intact to light touch.     Pertinent Labs/Diagnostics: Corrected Dilantin 20  Etta Quill PA-C [facility] [phone]  Impression: break through seizure in setting on medication noncompliance. No further seizures on Dilantin   Recommendations: 1) Continue current dose Dilantin will continue to follow.     01/13/2017, 9:16 AM

## 2017-01-13 NOTE — Evaluation (Signed)
Physical Therapy Evaluation Patient Details Name: Noah Rose MRN: [id number] DOB: 08-15-1950 Today's Date: 01/13/2017   History of Present Illness  Pt is a 66 y/o male admitted after fall secondary to seizure which resulted in laceration to scalp. PMH includes anxiety, HTN, CKD, syncope, epilepsy, mild cognitive impairment. CT revealed foci of arterial vascular calcificationn, however, no acute abnormalities. EEG reported origin of seizures to temporal region.   Clinical Impression  Pt admitted secondary to problem above with deficits below. PTA, pt was independent with functional mobility. Upon eval, pt limited by decreased steadiness and weakness. Pt required min guard and HHA for safety during ambulation. Educated about need for RW to increase stability and for HHPT given current deficits. Pt and family agreeable. Will continue to follow acutely to maximize functional mobility independence.     Follow Up Recommendations Home health PT;Supervision for mobility/OOB    Equipment Recommendations  Rolling walker with 5" wheels;3in1 (PT)    Recommendations for Other Services       Precautions / Restrictions Precautions Precautions: Fall;Other (comment) Precaution Comments: seizure Restrictions Weight Bearing Restrictions: No      Mobility  Bed Mobility Overal bed mobility: Needs Assistance Bed Mobility: Supine to Sit;Sit to Supine     Supine to sit: Supervision Sit to supine: Supervision   General bed mobility comments: Supervision for safety. Required extended time to complete.   Transfers Overall transfer level: Needs assistance Equipment used: Rolling walker (2 wheeled) Transfers: Sit to/from Stand Sit to Stand: Min guard         General transfer comment: Min guard for safety. Verbal cues for safe hand placement.   Ambulation/Gait Ambulation/Gait assistance: Min guard Ambulation Distance (Feet): 125 Feet Assistive device: 1 person hand held assist Gait  Pattern/deviations: Step-through pattern;Decreased stride length;Drifts right/left;Trunk flexed Gait velocity: Decreased Gait velocity interpretation: Below normal speed for age/gender General Gait Details: Slow, unsteady gait without use of AD. Cues for upright posture. Educated about use of RW at home, and pt agreeable. Reports he feels weakness in his LE.   Stairs            Wheelchair Mobility    Modified Rankin (Stroke Patients Only)       Balance Overall balance assessment: Needs assistance Sitting-balance support: No upper extremity supported;Feet supported Sitting balance-Leahy Scale: Good     Standing balance support: Single extremity supported;During functional activity Standing balance-Leahy Scale: Poor Standing balance comment: Reliant on UE support for steadying                             Pertinent Vitals/Pain Pain Assessment: No/denies pain    Home Living Family/patient expects to be discharged to:: Private residence Living Arrangements: Spouse/significant other Available Help at Discharge: Family;Available 24 hours/day Type of Home: House Home Access: Stairs to enter Entrance Stairs-Rails: Right;Left;Can reach both Entrance Stairs-Number of Steps: 4 Home Layout: Two level;Able to live on main level with bedroom/bathroom;Bed/bath upstairs Home Equipment: None      Prior Function Level of Independence: Independent               Hand Dominance   Dominant Hand: Right    Extremity/Trunk Assessment   Upper Extremity Assessment Upper Extremity Assessment: Generalized weakness    Lower Extremity Assessment Lower Extremity Assessment: Generalized weakness    Cervical / Trunk Assessment Cervical / Trunk Assessment: Kyphotic  Communication   Communication: No difficulties  Cognition Arousal/Alertness:  (fatigued) Behavior During Therapy:  WFL for tasks assessed/performed Overall Cognitive Status: Within Functional Limits for  tasks assessed                                        General Comments General comments (skin integrity, edema, etc.): Educated about deficits and need for HHPT. Pt agreeable.     Exercises     Assessment/Plan    PT Assessment Patient needs continued PT services  PT Problem List Decreased strength;Decreased balance;Decreased mobility;Decreased knowledge of use of DME;Decreased knowledge of precautions       PT Treatment Interventions DME instruction;Gait training;Stair training;Functional mobility training;Therapeutic activities;Therapeutic exercise;Balance training;Patient/family education;Neuromuscular re-education    PT Goals (Current goals can be found in the Care Plan section)  Acute Rehab PT Goals Patient Stated Goal: to go home  PT Goal Formulation: With patient Time For Goal Achievement: 01/20/17 Potential to Achieve Goals: Good    Frequency Min 3X/week   Barriers to discharge        Co-evaluation               AM-PAC PT "6 Clicks" Daily Activity  Outcome Measure Difficulty turning over in bed (including adjusting bedclothes, sheets and blankets)?: A Little Difficulty moving from lying on back to sitting on the side of the bed? : A Little Difficulty sitting down on and standing up from a chair with arms (e.g., wheelchair, bedside commode, etc,.)?: Total Help needed moving to and from a bed to chair (including a wheelchair)?: A Little Help needed walking in hospital room?: A Little Help needed climbing 3-5 steps with a railing? : A Little 6 Click Score: 16    End of Session Equipment Utilized During Treatment: Gait belt Activity Tolerance: Patient tolerated treatment well Patient left: in bed;with call bell/phone within reach;with bed alarm set;with family/visitor present Nurse Communication: Mobility status PT Visit Diagnosis: Unsteadiness on feet (R26.81);History of falling (Z91.81);Other symptoms and signs involving the nervous system  (R29.898)    Time: 4481-8563 PT Time Calculation (min) (ACUTE ONLY): 20 min   Charges:   PT Evaluation $PT Eval Low Complexity: 1 Procedure PT Treatments $Gait Training: 8-22 mins   PT G Codes:        Leighton Ruff, PT, DPT  Acute Rehabilitation Services  Pager: [phone]   Rudean Hitt 01/13/2017, 12:52 PM

## 2017-01-15 NOTE — Discharge Summary (Signed)
Physician Discharge Summary  Noah Rose NIO:[id number] DOB: Apr 21, 1951 DOA: 01/11/2017  PCP: Steva Colder, MD  Admit date: 01/11/2017 Discharge date: 01/13/2017  Admitted From: Home Disposition:  Home  Recommendations for Outpatient Follow-up:  1. Follow up with PCP in 1-2 weeks 2. Please obtain BMP/CBC in one week 3. Please follow up with [facility] neurology as recommended.   Home Health:yes  Discharge Condition:stable.  CODE STATUS: full code.  Diet recommendation: Heart Healthy  Brief/Interim Summary: Noah Rose a 66 y.o.malewith a past medical history significant for epilepsy, non-compliance with treatment, multiple drug intolerances, HTN and mild cognitive impairmentwho presents with seizure secondary to non compliance.  He was loaded with dilantin and dilantin level on discharge was therapeutic. Neurology consulted and recommendations were to continue the same.   Discharge Diagnoses:  Principal Problem:   Seizure ([place]) Active Problems:   Chronic anxiety   Hypertensive urgency   History of nonadherence to medical treatment   CKD (chronic kidney disease), stage III  Breakthrough seizures secondary to medication non compliance:  Loaded with dilantin in ED, he is seizure free since then.  He is alert this am and able to answer  questions.  EEG done and results are the following.  Impression: This awake and asleep EEG is abnormal due to the presence of: 1. Independent focal slowing over the bilateral temporal regions 2. Frequent epileptiform discharges over the left frontotemporal region 3. Occasional independent epileptiform discharges over the right frontotemporal region   Neurology consulted and recommendations given.  Resume dilantin 300 mg daily and encourage compliance. Discussed with wife today.  Dilantin level therapeutic.    Stage 3 CKD:  Creatinine at baseline.    Chronic anxiety disorder:  Resume clonazepam.    Hypertensive  urgency:  Resume home medications..    Discharge Instructions  Discharge Instructions    Diet - low sodium heart healthy    Complete by:  As directed    Discharge instructions    Complete by:  As directed    Follow up with PCP in one week.  Please follow up with neurology as recommended in 2 weeks at [facility]. The patient is not to drive. Per [facility] statutes, patients with seizures are not allowed to drive until  they have been seizure-free for six months. Use caution when using heavy equipment or power tools. Avoid working on ladders or at heights. Take showers instead of baths. Ensure the water temperature is not too high on the home water heater. Do not go swimming alone. When caring for infants or small children, sit down when holding, feeding, or changing them to minimize risk of injury to the child in the event you have a seizure.     Allergies as of 01/13/2017      Reactions   Almond Oil Shortness Of Breath   Corn Oil Shortness Of Breath   Dextroamphetamine Shortness Of Breath   dizziness   Dust Mite Mixed Allergen Ext [mite (d. Farinae)] Shortness Of Breath   Salicylates Shortness Of Breath, Other (See Comments)   Other reaction(s): Other (See Comments), Syncope   Amlodipine    Other reaction(s): Other (See Comments) Other Reaction: PATIENT STATES HE CANNOT TOLER dizziness   Amphetamines    dizziness   Aspirin    Other reaction(s): Unknown dizziness   Beet [beta Vulgaris] Other (See Comments)   Congestion and sneezing   Cantaloupe (diagnostic) Other (See Comments)   Congestion and sneezing   Carrot [daucus Carota] Other (  See Comments)   Congestion and sneezing   Cheese    American cheddar   Corn-containing Products Other (See Comments)   Congestion and sneezing   Felodipine    Other reaction(s): Other (See Comments) Other Reaction: PATIENT STATES HE CANNOT TOLER dizziness   Fructose Other (See Comments)   Congestion and sneezing   Green  Dyes Other (See Comments)   #3 Congestion and sneezing   Indapamide    Other reaction(s): Other (See Comments) Other Reaction: PATIENT STATES HE CANNOT TOLER dizziness   Iodinated Diagnostic Agents Other (See Comments)   Other reaction(s): Unknown Uncoded Allergy. Allergen: Preservatives, Other Reaction: HTN CRISIS Uncoded Allergy. Allergen: ANTICONVULSANTS Uncoded Allergy. Allergen: Preservatives, Other Reaction: HTN CRISIS Uncoded Allergy. Allergen: ANTICONVULSANTS   Lambs Quarters    lamb   Milk-related Compounds Other (See Comments)   Congestion and sneezing   Mustard [allyl Isothiocyanate] Other (See Comments)   Congestion and sneezing   Other Other (See Comments)   Other reaction(s): Unknown Uncoded Allergy. Allergen: ANTICONVULSANTS dizziness Other reaction(s): Unknown Uncoded Allergy. Allergen: ALCOHOL COUGH MEDICINE Other reaction(s): Unknown Numerous food allergies A lot of preservatives and medicine ingredients   Pineapple Other (See Comments)   Congestion and sneezing   Plum Pulp Other (See Comments)   Congestion and sneezing   Raspberry Other (See Comments)   Congestion and sneezing   Red Dye Other (See Comments)   #40 Congestion and sneezing   Salicylic Acid    Other reaction(s): Other (See Comments) Many issues   Scallops [shellfish Allergy] Other (See Comments)   Congestion and sneezing   Tea Other (See Comments)   Congestion and sneezing   Vanilla Other (See Comments)   Congestion and sneezing   Whey Other (See Comments)   Congestion and sneezing      Medication List    TAKE these medications   clonazePAM 0.5 MG tablet Commonly known as:  KLONOPIN Take 0.5 mg by mouth 2 (two) times daily as needed for anxiety.   cloNIDine 0.1 MG tablet Commonly known as:  CATAPRES Take 0.1 mg by mouth 2 (two) times daily as needed (blood pressure 185-190).   finasteride 5 MG tablet Commonly known as:  PROSCAR Take 5 mg by mouth daily.   fluticasone 50  MCG/ACT nasal spray Commonly known as:  FLONASE Place 1-2 sprays into both nostrils daily as needed for allergies or rhinitis.   lisinopril 20 MG tablet Commonly known as:  PRINIVIL,ZESTRIL Take 7.5 mg by mouth.   NETI POT SINUS [place] NA Place 1 Applicatorful into the nose daily.   phenytoin 300 MG ER capsule Commonly known as:  DILANTIN Take 1 capsule (300 mg total) by mouth at bedtime.      Follow-up Information    Steva Colder, MD. Schedule an appointment as soon as possible for a visit in 1 week(s).   Specialty:  Family Medicine Why:  post hospitalization visit.  Contact information: [address] [place] [address] [place] Follow up.   Why:  physical therapy and registered nurse Contact information: [address] [place] [address] [place] Follow up.   Contact information: [place] [address] [phone]          Allergies  Allergen Reactions  . Almond Oil Shortness Of Breath  . Corn Oil Shortness Of Breath  .  Dextroamphetamine Shortness Of Breath    dizziness  . Dust Mite Mixed Allergen Ext [Mite (D. Farinae)] Shortness Of Breath  . Salicylates Shortness Of Breath and Other (See Comments)    Other reaction(s): Other (See Comments), Syncope  . Amlodipine     Other reaction(s): Other (See Comments) Other Reaction: PATIENT STATES HE CANNOT TOLER dizziness  . Amphetamines     dizziness  . Aspirin     Other reaction(s): Unknown dizziness  . Beet [Beta Vulgaris] Other (See Comments)    Congestion and sneezing  . Cantaloupe (Diagnostic) Other (See Comments)    Congestion and sneezing  . Carrot [Daucus Carota] Other (See Comments)    Congestion and sneezing  . Cheese     American cheddar  . Corn-Containing Products Other (See Comments)    Congestion and sneezing  . Felodipine     Other  reaction(s): Other (See Comments) Other Reaction: PATIENT STATES HE CANNOT TOLER dizziness  . Fructose Other (See Comments)    Congestion and sneezing  . Green Dyes Other (See Comments)    #3 Congestion and sneezing  . Indapamide     Other reaction(s): Other (See Comments) Other Reaction: PATIENT STATES HE CANNOT TOLER dizziness  . Iodinated Diagnostic Agents Other (See Comments)    Other reaction(s): Unknown Uncoded Allergy. Allergen: Preservatives, Other Reaction: HTN CRISIS Uncoded Allergy. Allergen: ANTICONVULSANTS Uncoded Allergy. Allergen: Preservatives, Other Reaction: HTN CRISIS Uncoded Allergy. Allergen: ANTICONVULSANTS  . Lambs Quarters     lamb  . Milk-Related Compounds Other (See Comments)    Congestion and sneezing  . Madelaine Bhat Isothiocyanate] Other (See Comments)    Congestion and sneezing  . Other Other (See Comments)    Other reaction(s): Unknown Uncoded Allergy. Allergen: ANTICONVULSANTS dizziness Other reaction(s): Unknown Uncoded Allergy. Allergen: ALCOHOL COUGH MEDICINE Other reaction(s): Unknown Numerous food allergies A lot of preservatives and medicine ingredients  . Pineapple Other (See Comments)    Congestion and sneezing  . Plum Pulp Other (See Comments)    Congestion and sneezing  . Raspberry Other (See Comments)    Congestion and sneezing  . Red Dye Other (See Comments)    #40 Congestion and sneezing  . Salicylic Acid     Other reaction(s): Other (See Comments) Many issues  . Scallops [Shellfish Allergy] Other (See Comments)    Congestion and sneezing  . Tea Other (See Comments)    Congestion and sneezing  . Vanilla Other (See Comments)    Congestion and sneezing  . Whey Other (See Comments)    Congestion and sneezing    Consultations:  Neurology.   Procedures/Studies: Ct Head Wo Contrast  Result Date: 01/11/2017 CLINICAL DATA:  Fall with questionable seizure.  Hypertension. EXAM: CT HEAD WITHOUT CONTRAST TECHNIQUE:  Contiguous axial images were obtained from the base of the skull through the vertex without intravenous contrast. COMPARISON:  April 23, 2016 FINDINGS: Brain: The ventricles are normal in size and configuration. There is no intracranial mass, hemorrhage, extra-axial fluid collection, or midline shift. There is minimal periventricular small vessel disease in the centra semiovale bilaterally. Elsewhere gray-white compartments appear normal. No evident acute infarct. Vascular: There is no hyperdense vessel. There is calcification in each carotid siphon region. Skull: The bony calvarium appears intact. Sinuses/Orbits: There is mucosal thickening in the right maxillary antrum. There is opacification and mucosal thickening in several ethmoid air cells bilaterally. There is mucosal thickening in the anterior right frontal sinus. Other paranasal sinuses which are visualized are clear. Orbits appear symmetric bilaterally.  Other: There is extensive pneumatization of the mastoid air cells. The mastoid air cells clear. IMPRESSION: Minimal periventricular small vessel disease. No intracranial mass, hemorrhage, or extra-axial fluid collection. Foci of arterial vascular calcification noted. Areas of paranasal sinus disease noted. Electronically Signed   By: Lowella Grip III M.D.   On: 01/11/2017 20:31    EEG: Impression: This awake and asleep EEG is abnormal due to the presence of: 1. Independent focal slowing over the bilateral temporal regions 2. Frequent epileptiform discharges over the left frontotemporal region 3. Occasional independent epileptiform discharges over the right frontotemporal region   Subjective:  NO new complaints.  Discharge Exam: Vitals:   01/13/17 0512 01/13/17 1033  BP: (!) 159/93 (!) 157/88  Pulse: 69 75  Resp: 20 20  Temp: 97.9 F (36.6 C) 98.3 F (36.8 C)   Vitals:   01/12/17 2047 01/13/17 0048 01/13/17 0512 01/13/17 1033  BP: (!) 167/96 (!) 156/98 (!) 159/93 (!) 157/88   Pulse: 82 75 69 75  Resp: 20 20 20 20   Temp: 98.3 F (36.8 C) 98.4 F (36.9 C) 97.9 F (36.6 C) 98.3 F (36.8 C)  TempSrc: Oral Oral Oral Oral  SpO2: 96% 96% 97% 97%  Weight:      Height:        General: Pt is alert, awake, not in acute distress Cardiovascular: RRR, S1/S2 +, no rubs, no gallops Respiratory: CTA bilaterally, no wheezing, no rhonchi Abdominal: Soft, NT, ND, bowel sounds + Extremities: no edema, no cyanosis    The results of significant diagnostics from this hospitalization (including imaging, microbiology, ancillary and laboratory) are listed below for reference.     Microbiology: No results found for this or any previous visit (from the past 240 hour(s)).   Labs: BNP (last 3 results) No results for input(s): BNP in the last 8760 hours. Basic Metabolic Panel:  Recent Labs Lab 01/11/17 1903  NA 139  K 3.7  CL 107  CO2 25  GLUCOSE 97  BUN 29*  CREATININE 1.35*  CALCIUM 8.7*  MG 2.4   Liver Function Tests: No results for input(s): AST, ALT, ALKPHOS, BILITOT, PROT, ALBUMIN in the last 168 hours. No results for input(s): LIPASE, AMYLASE in the last 168 hours. No results for input(s): AMMONIA in the last 168 hours. CBC:  Recent Labs Lab 01/11/17 1903  WBC 7.1  HGB 14.1  HCT 42.8  MCV 84.4  PLT 171   Cardiac Enzymes: No results for input(s): CKTOTAL, CKMB, CKMBINDEX, TROPONINI in the last 168 hours. BNP: Invalid input(s): POCBNP CBG:  Recent Labs Lab 01/11/17 2047  GLUCAP 111*   D-Dimer No results for input(s): DDIMER in the last 72 hours. Hgb A1c No results for input(s): HGBA1C in the last 72 hours. Lipid Profile No results for input(s): CHOL, HDL, LDLCALC, TRIG, CHOLHDL, LDLDIRECT in the last 72 hours. Thyroid function studies No results for input(s): TSH, T4TOTAL, T3FREE, THYROIDAB in the last 72 hours.  Invalid input(s): FREET3 Anemia work up No results for input(s): VITAMINB12, FOLATE, FERRITIN, TIBC, IRON, RETICCTPCT in  the last 72 hours. Urinalysis    Component Value Date/Time   COLORURINE YELLOW 04/23/2016 1921   APPEARANCEUR Clear 07/26/2016 1031   LABSPEC 1.012 04/23/2016 1921   PHURINE 8.0 04/23/2016 1921   GLUCOSEU Negative 07/26/2016 1031   HGBUR MODERATE (A) 04/23/2016 1921   BILIRUBINUR Negative 07/26/2016 1031   KETONESUR NEGATIVE 04/23/2016 1921   PROTEINUR 1+ (A) 07/26/2016 1031   PROTEINUR NEGATIVE 04/23/2016 1921   UROBILINOGEN  0.2 01/01/2015 0415   NITRITE Negative 07/26/2016 1031   NITRITE NEGATIVE 04/23/2016 1921   LEUKOCYTESUR Negative 07/26/2016 1031   Sepsis Labs Invalid input(s): PROCALCITONIN,  WBC,  LACTICIDVEN Microbiology No results found for this or any previous visit (from the past 240 hour(s)).   Time coordinating discharge: Over 30 minutes  SIGNED:   Hosie Poisson, MD  [facility] 01/15/2017, 10:49 PM Pager   If 7PM-7AM, please contact night-coverage [email or web address] Password [email or web address]

## 2017-02-21 ENCOUNTER — Encounter: Payer: Self-pay | Admitting: Internal Medicine

## 2017-04-29 ENCOUNTER — Emergency Department (HOSPITAL_COMMUNITY): Payer: Medicare Other

## 2017-04-29 ENCOUNTER — Observation Stay (HOSPITAL_COMMUNITY)
Admission: EM | Admit: 2017-04-29 | Discharge: 2017-04-30 | Disposition: A | Discharge status: Home / Self Care | Payer: Medicare Other | Attending: Family Medicine | Admitting: Family Medicine

## 2017-04-29 ENCOUNTER — Encounter (HOSPITAL_COMMUNITY): Payer: Self-pay | Admitting: Emergency Medicine

## 2017-04-29 DIAGNOSIS — R42 Dizziness and giddiness: Secondary | ICD-10-CM

## 2017-04-29 DIAGNOSIS — I129 Hypertensive chronic kidney disease with stage 1 through stage 4 chronic kidney disease, or unspecified chronic kidney disease: Secondary | ICD-10-CM | POA: Insufficient documentation

## 2017-04-29 DIAGNOSIS — N183 Chronic kidney disease, stage 3 (moderate): Secondary | ICD-10-CM | POA: Diagnosis not present

## 2017-04-29 DIAGNOSIS — I10 Essential (primary) hypertension: Secondary | ICD-10-CM

## 2017-04-29 DIAGNOSIS — J988 Other specified respiratory disorders: Secondary | ICD-10-CM

## 2017-04-29 DIAGNOSIS — Z79899 Other long term (current) drug therapy: Secondary | ICD-10-CM | POA: Diagnosis not present

## 2017-04-29 DIAGNOSIS — R569 Unspecified convulsions: Principal | ICD-10-CM

## 2017-04-29 LAB — BASIC METABOLIC PANEL
ANION GAP: 15 (ref 5–15)
BUN: 18 mg/dL (ref 6–20)
CHLORIDE: 102 mmol/L (ref 101–111)
CO2: 19 mmol/L — ABNORMAL LOW (ref 22–32)
Calcium: 8.7 mg/dL — ABNORMAL LOW (ref 8.9–10.3)
Creatinine, Ser: 1.2 mg/dL (ref 0.61–1.24)
GFR calc Af Amer: >60 mL/min (ref >60)
Glucose, Bld: 81 mg/dL (ref 65–99)
POTASSIUM: 3.5 mmol/L (ref 3.5–5.1)
SODIUM: 136 mmol/L (ref 135–145)

## 2017-04-29 LAB — CBG MONITORING, ED
GLUCOSE-CAPILLARY: 105 mg/dL — AB (ref 65–99)
Glucose-Capillary: 63 mg/dL — ABNORMAL LOW (ref 65–99)

## 2017-04-29 LAB — CBC
HEMATOCRIT: 44.9 % (ref 39.0–52.0)
HEMOGLOBIN: 15 g/dL (ref 13.0–17.0)
MCH: 27.9 pg (ref 26.0–34.0)
MCHC: 33.4 g/dL (ref 30.0–36.0)
MCV: 83.5 fL (ref 78.0–100.0)
Platelets: 172 10*3/uL (ref 150–400)
RBC: 5.38 MIL/uL (ref 4.22–5.81)
RDW: 14 % (ref 11.5–15.5)
WBC: 6.6 10*3/uL (ref 4.0–10.5)

## 2017-04-29 LAB — PHENYTOIN LEVEL, TOTAL: Phenytoin Lvl: <2.5 ug/mL — ABNORMAL LOW (ref 10.0–20.0)

## 2017-04-29 LAB — ETHANOL

## 2017-04-29 LAB — MAGNESIUM: MAGNESIUM: 2.9 mg/dL — AB (ref 1.7–2.4)

## 2017-04-29 MED ORDER — FINASTERIDE 5 MG PO TABS
5.0000 mg | ORAL_TABLET | Freq: Every day | ORAL | Status: DC
  Administered 2017-04-30: 5 mg via ORAL
  Filled 2017-04-29: qty 1

## 2017-04-29 MED ORDER — ACETAMINOPHEN 650 MG RE SUPP: 650.0000 mg | Freq: Four times a day (QID) | RECTAL | Status: DC | PRN

## 2017-04-29 MED ORDER — LISINOPRIL 10 MG PO TABS
10.0000 mg | ORAL_TABLET | Freq: Every day | ORAL | Status: DC
  Administered 2017-04-30 (×2): 10 mg via ORAL
  Filled 2017-04-29 (×2): qty 1

## 2017-04-29 MED ORDER — LORAZEPAM 2 MG/ML IJ SOLN
1.0000 mg | Freq: Once | INTRAMUSCULAR | Status: AC
  Administered 2017-04-29: 1 mg via INTRAVENOUS
  Filled 2017-04-29: qty 1

## 2017-04-29 MED ORDER — CLONAZEPAM 0.5 MG PO TABS: 0.5000 mg | ORAL_TABLET | Freq: Two times a day (BID) | ORAL | Status: DC | PRN

## 2017-04-29 MED ORDER — ONDANSETRON HCL 4 MG PO TABS: 4.0000 mg | ORAL_TABLET | Freq: Four times a day (QID) | ORAL | Status: DC | PRN

## 2017-04-29 MED ORDER — ENOXAPARIN SODIUM 40 MG/0.4ML ~~LOC~~ SOLN
40.0000 mg | Freq: Every day | SUBCUTANEOUS | Status: DC
  Administered 2017-04-30: 40 mg via SUBCUTANEOUS
  Filled 2017-04-29: qty 0.4

## 2017-04-29 MED ORDER — CLONIDINE HCL 0.2 MG PO TABS
0.2000 mg | ORAL_TABLET | Freq: Two times a day (BID) | ORAL | Status: DC
  Administered 2017-04-30 (×2): 0.2 mg via ORAL
  Filled 2017-04-29: qty 1
  Filled 2017-04-29: qty 2
  Filled 2017-04-29 (×2): qty 1
  Filled 2017-04-29: qty 2

## 2017-04-29 MED ORDER — SODIUM CHLORIDE 0.9 % IV BOLUS (SEPSIS)
500.0000 mL | Freq: Once | INTRAVENOUS | Status: AC
  Administered 2017-04-29: 500 mL via INTRAVENOUS

## 2017-04-29 MED ORDER — SODIUM CHLORIDE 0.9 % IV SOLN
Freq: Once | INTRAVENOUS | Status: AC
  Administered 2017-04-29: 16:00:00 via INTRAVENOUS

## 2017-04-29 MED ORDER — ONDANSETRON HCL 4 MG/2ML IJ SOLN: 4.0000 mg | Freq: Four times a day (QID) | INTRAMUSCULAR | Status: DC | PRN

## 2017-04-29 MED ORDER — ACETAMINOPHEN 325 MG PO TABS: 650.0000 mg | ORAL_TABLET | Freq: Four times a day (QID) | ORAL | Status: DC | PRN

## 2017-04-29 MED ORDER — PHENYTOIN SODIUM EXTENDED 100 MG PO CAPS: 300.0000 mg | ORAL_CAPSULE | Freq: Every day | ORAL | Status: DC

## 2017-04-29 MED ORDER — SODIUM CHLORIDE 0.9 % IV SOLN
1500.0000 mg | Freq: Once | INTRAVENOUS | Status: AC
  Administered 2017-04-29: 1500 mg via INTRAVENOUS
  Filled 2017-04-29: qty 30

## 2017-04-29 NOTE — H&P (Signed)
History and Physical    Noah Rose QPY:[id number] DOB: 1950/09/13 DOA: 04/29/2017  PCP: Steva Colder, MD  Patient coming from: Home  I have personally briefly reviewed patient's old medical records in [place]  Chief Complaint: Seizure  HPI: Noah Rose is a 65 y.o. male with medical history significant of Seizures, HTN.  Patient hasnt been taking dilantin at home for past couple of months.  Had seizure at football game today.   ED Course: Loaded with Dilantin after phentoin level was undetectable.  Remains post-ictal and not back to baseline.   Review of Systems: As per HPI otherwise 10 point review of systems negative.   Past Medical History:  Diagnosis Date  . Anxiety   . Dizziness   . High blood pressure   . Hypertension   . Multiple allergies   . Seizures ([place])     Past Surgical History:  Procedure Laterality Date  . HERNIA REPAIR  02/2016     reports that he has never smoked. He has never used smokeless tobacco. He reports that he does not drink alcohol or use drugs.  Allergies  Allergen Reactions  . Almond Oil Shortness Of Breath  . Corn Oil Shortness Of Breath  . Dextroamphetamine Shortness Of Breath    dizziness  . Dust Mite Mixed Allergen Ext [Mite (D. Farinae)] Shortness Of Breath  . Salicylates Shortness Of Breath and Other (See Comments)    Other reaction(s): Other (See Comments), Syncope  . Amlodipine     Other reaction(s): Other (See Comments) Other Reaction: PATIENT STATES HE CANNOT TOLER dizziness  . Amphetamines     dizziness  . Aspirin     Other reaction(s): Unknown dizziness  . Beet [Beta Vulgaris] Other (See Comments)    Congestion and sneezing  . Cantaloupe (Diagnostic) Other (See Comments)    Congestion and sneezing  . Carrot [Daucus Carota] Other (See Comments)    Congestion and sneezing  . Cheese     American cheddar  . Corn-Containing Products Other (See Comments)    Congestion and sneezing  . Felodipine      Other reaction(s): Other (See Comments) Other Reaction: PATIENT STATES HE CANNOT TOLER dizziness  . Fructose Other (See Comments)    Congestion and sneezing  . Green Dyes Other (See Comments)    #3 Congestion and sneezing  . Indapamide     Other reaction(s): Other (See Comments) Other Reaction: PATIENT STATES HE CANNOT TOLER dizziness  . Iodinated Diagnostic Agents Other (See Comments)    Other reaction(s): Unknown Uncoded Allergy. Allergen: Preservatives, Other Reaction: HTN CRISIS Uncoded Allergy. Allergen: ANTICONVULSANTS Uncoded Allergy. Allergen: Preservatives, Other Reaction: HTN CRISIS Uncoded Allergy. Allergen: ANTICONVULSANTS  . Lambs Quarters     lamb  . Milk-Related Compounds Other (See Comments)    Congestion and sneezing  . Madelaine Bhat Isothiocyanate] Other (See Comments)    Congestion and sneezing  . Other Other (See Comments)    Other reaction(s): Unknown Uncoded Allergy. Allergen: ANTICONVULSANTS dizziness Other reaction(s): Unknown Uncoded Allergy. Allergen: ALCOHOL COUGH MEDICINE Other reaction(s): Unknown Numerous food allergies A lot of preservatives and medicine ingredients  . Pineapple Other (See Comments)    Congestion and sneezing  . Plum Pulp Other (See Comments)    Congestion and sneezing  . Raspberry Other (See Comments)    Congestion and sneezing  . Red Dye Other (See Comments)    #40 Congestion and sneezing  . Salicylic Acid     Other reaction(s): Other (See  Comments) Many issues  . Scallops [Shellfish Allergy] Other (See Comments)    Congestion and sneezing  . Tea Other (See Comments)    Congestion and sneezing  . Vanilla Other (See Comments)    Congestion and sneezing  . Whey Other (See Comments)    Congestion and sneezing    Family History  Problem Relation Age of Onset  . Dementia Mother   . Heart disease Father   . Parkinson's disease Sister   . Diabetes Sister   . Pancreatic cancer Sister   . Leukemia Sister       Prior to Admission medications   Medication Sig Start Date End Date Taking? Authorizing Provider  clonazePAM (KLONOPIN) 0.5 MG tablet Take 0.5 mg by mouth 2 (two) times daily as needed for anxiety.    Yes [provider]  cloNIDine (CATAPRES) 0.2 MG tablet Take 0.2 tablets by mouth 2 (two) times daily. 03/01/17  Yes [provider]  finasteride (PROSCAR) 5 MG tablet Take 5 mg by mouth daily.   Yes [provider]  lisinopril (PRINIVIL,ZESTRIL) 20 MG tablet Take 10 mg by mouth.  03/01/16  Yes [provider]  phenytoin (DILANTIN) 300 MG ER capsule Take 1 capsule (300 mg total) by mouth at bedtime. 01/13/17  Yes Hosie Poisson, MD  Sodium Chloride-Sodium Bicarb (NETI POT SINUS [place] NA) Place 1 Applicatorful into the nose daily.   Yes [provider]    Physical Exam: Vitals:   04/29/17 1800 04/29/17 1815 04/29/17 2015 04/29/17 2045  BP: (!) 150/91 (!) 156/94 (!) 153/98 (!) 168/106  Pulse: 82 81 80 73  Resp: 13 13 13 13   Temp:      TempSrc:      SpO2: 97% 99% 96% 97%    Constitutional: NAD, calm, comfortable, sleepy, can wake up but not yet back to baseline. Eyes: PERRL, lids and conjunctivae normal ENMT: Mucous membranes are moist. Posterior pharynx clear of any exudate or lesions.Normal dentition.  Neck: normal, supple, no masses, no thyromegaly Respiratory: clear to auscultation bilaterally, no wheezing, no crackles. Normal respiratory effort. No accessory muscle use.  Cardiovascular: Regular rate and rhythm, no murmurs / rubs / gallops. No extremity edema. 2+ pedal pulses. No carotid bruits.  Abdomen: no tenderness, no masses palpated. No hepatosplenomegaly. Bowel sounds positive.  Musculoskeletal: no clubbing / cyanosis. No joint deformity upper and lower extremities. Good ROM, no contractures. Normal muscle tone.  Skin: no rashes, lesions, ulcers. No induration Neurologic: CN 2-12 grossly intact. Sensation intact, DTR normal. Strength  5/5 in all 4.  Psychiatric: Normal judgment and insight. Alert and oriented x 3. Normal mood.    Labs on Admission: I have personally reviewed following labs and imaging studies  CBC:  Recent Labs Lab 04/29/17 1543  WBC 6.6  HGB 15.0  HCT 44.9  MCV 83.5  PLT 601   Basic Metabolic Panel:  Recent Labs Lab 04/29/17 1543  NA 136  K 3.5  CL 102  CO2 19*  GLUCOSE 81  BUN 18  CREATININE 1.20  CALCIUM 8.7*  MG 2.9*   GFR: CrCl cannot be calculated (Unknown ideal weight.). Liver Function Tests: No results for input(s): AST, ALT, ALKPHOS, BILITOT, PROT, ALBUMIN in the last 168 hours. No results for input(s): LIPASE, AMYLASE in the last 168 hours. No results for input(s): AMMONIA in the last 168 hours. Coagulation Profile: No results for input(s): INR, PROTIME in the last 168 hours. Cardiac Enzymes: No results for input(s): CKTOTAL, CKMB, CKMBINDEX, TROPONINI in  the last 168 hours. BNP (last 3 results) No results for input(s): PROBNP in the last 8760 hours. HbA1C: No results for input(s): HGBA1C in the last 72 hours. CBG:  Recent Labs Lab 04/29/17 1658  GLUCAP 63*   Lipid Profile: No results for input(s): CHOL, HDL, LDLCALC, TRIG, CHOLHDL, LDLDIRECT in the last 72 hours. Thyroid Function Tests: No results for input(s): TSH, T4TOTAL, FREET4, T3FREE, THYROIDAB in the last 72 hours. Anemia Panel: No results for input(s): VITAMINB12, FOLATE, FERRITIN, TIBC, IRON, RETICCTPCT in the last 72 hours. Urine analysis:    Component Value Date/Time   COLORURINE YELLOW 04/23/2016 1921   APPEARANCEUR Clear 07/26/2016 1031   LABSPEC 1.012 04/23/2016 1921   PHURINE 8.0 04/23/2016 1921   GLUCOSEU Negative 07/26/2016 1031   HGBUR MODERATE (A) 04/23/2016 1921   BILIRUBINUR Negative 07/26/2016 1031   KETONESUR NEGATIVE 04/23/2016 1921   PROTEINUR 1+ (A) 07/26/2016 1031   PROTEINUR NEGATIVE 04/23/2016 1921   UROBILINOGEN 0.2 01/01/2015 0415   NITRITE Negative 07/26/2016 1031    NITRITE NEGATIVE 04/23/2016 1921   LEUKOCYTESUR Negative 07/26/2016 1031    Radiological Exams on Admission: Ct Head Wo Contrast  Result Date: 04/29/2017 CLINICAL DATA:  Witnessed seizure today. EXAM: CT HEAD WITHOUT CONTRAST TECHNIQUE: Contiguous axial images were obtained from the base of the skull through the vertex without intravenous contrast. COMPARISON:  01/11/2017 FINDINGS: Brain: No evidence of acute infarction, hemorrhage, hydrocephalus, extra-axial collection or mass lesion/mass effect. Vascular: No hyperdense vessel or unexpected calcification. Skull: Normal. Negative for fracture or focal lesion. Sinuses/Orbits: Globes and orbits are unremarkable. Mild right maxillary and bilateral ethmoid sinus mucosal thickening. Mild mucosal thickening in the inferior right frontal sinus. Clear mastoid air cells and middle ear cavities. Other: None. IMPRESSION: 1. No acute intracranial abnormalities. Electronically Signed   By: Lajean Manes M.D.   On: 04/29/2017 17:05    EKG: Independently reviewed.  Assessment/Plan Principal Problem:   Post-ictal state [facility]) Active Problems:   Hypertension   Seizure ([place])    1. Post-ictal state from seizure - 1. Loaded with dilantin in ED 2. Resume home dosing dilantin 3. obs overnight 4. Needs neuro follow up 2. HTN - continue home meds  DVT prophylaxis: Lovenox Code Status: Full Family Communication: Family at bedside Disposition Plan: Home possibly in AM Consults called: None Admission status: Place in [place], [place] Hospitalists Pager [phone]  If 7AM-7PM, please contact day team taking care of patient [email or web address] Password [facility]  04/29/2017, 9:28 PM

## 2017-04-29 NOTE — ED Notes (Signed)
Patient transported to CT 

## 2017-04-29 NOTE — ED Notes (Signed)
Pt's wife provided with [place] bag

## 2017-04-29 NOTE — ED Triage Notes (Signed)
Per EMS:  Pt at [employer] football game today.  Had witnessed seizure by wife.  Pt with a hx according to EPIC chart.  Pt denies pain at this time.  Combative/post-ictal on scene.  More alert and oriented at this time, but still delayed.

## 2017-04-29 NOTE — Progress Notes (Signed)
Pt arrived to unit admitted to 3W14. Alert and oriented x 4, confused and drowsy with no c/o of pain. Pt moved to bed by staff. Will continue to monitor.

## 2017-04-29 NOTE — ED Provider Notes (Signed)
[facility] 3W PROGRESSIVE CARE Provider Note   CSN: [id number] Arrival date & time: 04/29/17  1533     History   Chief Complaint Chief Complaint  Patient presents with  . Seizures    HPI Noah Rose is a 66 y.o. male.  The history is provided by the patient and medical records. No language interpreter was used.  Seizures     Noah Rose is a 66 y.o. male  with a PMH of seizures, HTN who presents to the Emergency Department for evaluation of witnessed seizure-like activity which occurred just prior to arrival. Patient states that he was in his usual state of health yesterday and this morning. He went to the [employer] football game today and remembers very little about the events leading to ED visit. Wife at bedside states that he was sitting watching the game when his face started twitching. This then progressed to upper extremity shaking. Wife notes this is similar to his previous seizures, however did last a little longer than usual. She believes about 4-5 minutes. Patient states that he is on dilantin, but does not take it regularly. He notes that this medication makes him feel dizzy and that he believes the medication makes him feel worse. Wife states that he does not like it when people tell him he is having seizures, as he does not believe these are seizure episodes. No recent illness. No fever, chills, cough, congestion, abdominal pain, chest pain, shortness of breath, back pain, urinary symptoms. Per EMS, patient initially confused and combative. Upon ED arrival, patient appears much more alert.    Past Medical History:  Diagnosis Date  . Anxiety   . Dizziness   . High blood pressure   . Hypertension   . Multiple allergies   . Seizures [facility])     Patient Active Problem List   Diagnosis Date Noted  . Post-ictal state ([place]) 04/29/2017  . CKD (chronic kidney disease), stage III ([place]) 01/11/2017  . History of nonadherence to medical treatment 06/08/2016  . BPH (benign  prostatic hyperplasia) 04/24/2016  . Delirium   . Hyponatremia 04/23/2016  . Microscopic hematuria 04/23/2016  . Urinary retention: Probable 03/17/2016  . Sinusitis, acute, maxillary: Right 03/16/2016  . Convulsions ([place]) 03/16/2016  . Facial fracture ([place]) 03/15/2016  . Leukocytosis 03/15/2016  . Hypertensive urgency   . Orbital fracture ([place])   . Seizure ([place])   . Spells 06/03/2015  . Medication intolerance 04/03/2014  . Hypoglycemia 11/26/2013  . 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 Nasal turbinate hypertrophy 09/25/2013  . Nuclear sclerosis 05/08/2013  . Nasal congestion 04/15/2013  . Nocturia 04/02/2013  . Increased frequency of urination 04/02/2013  . Complex partial seizure ([place]) 11/05/2012  . Alteration consciousness 09/05/2012  . Syncope and collapse 07/28/2012  . Hypertension 07/28/2012  . Obstructive sleep apnea 07/28/2012  . Allergic rhinitis 07/28/2012  . Chronic anxiety 07/28/2012  . 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 Anxiety disorder 07/28/2012  . Dizziness 05/07/2012  . Breathing problem 01/21/2011  . Salicylate allergy 32/99/2426    Past Surgical History:  Procedure Laterality Date  . HERNIA REPAIR  02/2016       Home Medications    Prior to Admission medications   Medication Sig Start Date End Date Taking? Authorizing Provider  clonazePAM (KLONOPIN) 0.5 MG tablet Take 0.5 mg by mouth 2 (two) times daily as needed for anxiety.    Yes [provider]  cloNIDine (CATAPRES) 0.2 MG tablet Take 0.2 tablets by mouth 2 (two) times daily. 03/01/17  Yes [provider]  finasteride (PROSCAR) 5 MG tablet Take 5 mg by mouth daily.   Yes [provider]  lisinopril (PRINIVIL,ZESTRIL) 20 MG tablet Take 10 mg by mouth.  03/01/16  Yes [provider]  phenytoin (DILANTIN) 300 MG ER capsule Take 1 capsule (300 mg total) by mouth at  bedtime. 01/13/17  Yes Hosie Poisson, MD  Sodium Chloride-Sodium Bicarb (NETI POT SINUS [place] NA) Place 1 Applicatorful into the nose daily.   Yes [provider]    Family History Family History  Problem Relation Age of Onset  . Dementia Mother   . Heart disease Father   . Parkinson's disease Sister   . Diabetes Sister   . Pancreatic cancer Sister   . Leukemia Sister     Social History Social History  Substance Use Topics  . Smoking status: Never Smoker  . Smokeless tobacco: Never Used  . Alcohol use No     Allergies   Almond oil; Corn oil; Dextroamphetamine; Dust mite mixed allergen ext [mite (d. farinae)]; Salicylates; Amlodipine; Amphetamines; Aspirin; Beet [beta vulgaris]; Cantaloupe (diagnostic); Carrot [daucus carota]; Cheese; Corn-containing products; Felodipine; Fructose; Green dyes; Indapamide; Iodinated diagnostic agents; Lambs quarters; Milk-related compounds; Mustard [allyl isothiocyanate]; Other; Pineapple; Plum pulp; Raspberry; Red dye; Salicylic acid; Scallops [shellfish allergy]; Tea; Vanilla; and Whey   Review of Systems Review of Systems  Neurological: Positive for seizures.  All other systems reviewed and are negative.    Physical Exam Updated Vital Signs BP (!) 178/99   Pulse 74   Temp 98.4 F (36.9 C) (Oral)   Resp 14   Ht 5\' 9"  (1.753 m)   SpO2 99%   Physical Exam  Constitutional: He is oriented to person, place, and time. He appears well-developed and well-nourished. No distress.  HENT:  Head: Normocephalic and atraumatic.  Cardiovascular: Normal rate, regular rhythm and normal heart sounds.   No murmur heard. Pulmonary/Chest: Effort normal and breath sounds normal. No respiratory distress.  Abdominal: Soft. He exhibits no distension. There is no tenderness.  Musculoskeletal:  5/5 muscle strength in all four extremities. Moves all extremities well.  Neurological: He is alert and oriented to person, place, and time.  Drowsy, but  arousable. Unable to give coherent history of today's events. Oriented x3. Speech is clear. Able to follow commands.  Cranial Nerves:  II:  Peripheral visual fields grossly normal, pupils equal, round, reactive to light III, IV, VI: EOM intact bilaterally, ptosis not present V,VII: smile symmetric, eyes kept closed tightly against resistance, facial light touch sensation equal VIII: hearing grossly normal IX, X: symmetric soft palate movement, uvula elevates symmetrically  XI: bilateral shoulder shrug symmetric and strong XII: midline tongue extension Sensory to light touch normal in all four extremities.  Skin: Skin is warm and dry.  Nursing note and vitals reviewed.    ED Treatments / Results  Labs (all labs ordered are listed, but only abnormal results are displayed) Labs Reviewed  BASIC METABOLIC PANEL - Abnormal; Notable for the following:       Result Value   CO2 19 (*)    Calcium 8.7 (*)    All other components within normal limits  PHENYTOIN LEVEL, TOTAL - Abnormal; Notable for the following:    Phenytoin Lvl <2.5 (*)    All other components within normal limits  MAGNESIUM - Abnormal; Notable for the following:  Magnesium 2.9 (*)    All other components within normal limits  CBG MONITORING, ED - Abnormal; Notable for the following:    Glucose-Capillary 63 (*)    All other components within normal limits  CBG MONITORING, ED - Abnormal; Notable for the following:    Glucose-Capillary 105 (*)    All other components within normal limits  CBC  ETHANOL    EKG  EKG Interpretation  Date/Time:  Saturday April 29 2017 15:34:09 EDT Ventricular Rate:  108 PR Interval:    QRS Duration: 106 QT Interval:  357 QTC Calculation: 479 R Axis:   48 Text Interpretation:  Sinus tachycardia Abnormal R-wave progression, early transition Probable LVH with secondary repol abnrm Borderline prolonged QT interval Otherwise no significant change Confirmed by Addison Lank [phone]) on  04/29/2017 6:06:35 PM       Radiology Ct Head Wo Contrast  Result Date: 04/29/2017 CLINICAL DATA:  Witnessed seizure today. EXAM: CT HEAD WITHOUT CONTRAST TECHNIQUE: Contiguous axial images were obtained from the base of the skull through the vertex without intravenous contrast. COMPARISON:  01/11/2017 FINDINGS: Brain: No evidence of acute infarction, hemorrhage, hydrocephalus, extra-axial collection or mass lesion/mass effect. Vascular: No hyperdense vessel or unexpected calcification. Skull: Normal. Negative for fracture or focal lesion. Sinuses/Orbits: Globes and orbits are unremarkable. Mild right maxillary and bilateral ethmoid sinus mucosal thickening. Mild mucosal thickening in the inferior right frontal sinus. Clear mastoid air cells and middle ear cavities. Other: None. IMPRESSION: 1. No acute intracranial abnormalities. Electronically Signed   By: Lajean Manes M.D.   On: 04/29/2017 17:05    Procedures Procedures (including critical care time)  Medications Ordered in ED Medications  clonazePAM (KLONOPIN) tablet 0.5 mg (not administered)  finasteride (PROSCAR) tablet 5 mg (not administered)  lisinopril (PRINIVIL,ZESTRIL) tablet 10 mg (not administered)  phenytoin (DILANTIN) ER capsule 300 mg (not administered)  cloNIDine (CATAPRES) tablet 0.2 mg (not administered)  acetaminophen (TYLENOL) tablet 650 mg (not administered)    Or  acetaminophen (TYLENOL) suppository 650 mg (not administered)  ondansetron (ZOFRAN) tablet 4 mg (not administered)    Or  ondansetron (ZOFRAN) injection 4 mg (not administered)  enoxaparin (LOVENOX) injection 40 mg (not administered)  sodium chloride 0.9 % bolus 500 mL (0 mLs Intravenous Stopped 04/29/17 1651)    Followed by  0.9 %  sodium chloride infusion ( Intravenous Stopped 04/29/17 1754)  LORazepam (ATIVAN) injection 1 mg (1 mg Intravenous Given 04/29/17 1603)  phenytoin (DILANTIN) 1,500 mg in sodium chloride 0.9 % 250 mL IVPB (0 mg Intravenous  Stopped 04/29/17 1931)     Initial Impression / Assessment and Plan / ED Course  I have reviewed the triage vital signs and the nursing notes.  Pertinent labs & imaging results that were available during my care of the patient were reviewed by me and considered in my medical decision making (see chart for details).    MAXTEN SHULER is a 66 y.o. male who presents to ED for witnessed seizure. Hx of seizures on dilantin and endorses medication non-compliance. Dilantin level low today. No known infectious triggers. CT head negative. EKG reassuring. Ativan given upon arrival. Loading dose dilantin given. Patient monitored in ED, but did not return to baseline mental status. Hospitalist called to admit.   Patient seen by and discussed with Dr. Leonette Monarch who agrees with treatment plan.    Final Clinical Impressions(s) / ED Diagnoses   Final diagnoses:  Seizure [facility])    New Prescriptions Current Discharge Medication List  Ward, Ozella Almond, PA-C 04/29/17 [phone]

## 2017-04-29 NOTE — ED Notes (Signed)
Pt returned from CT °

## 2017-04-29 NOTE — ED Provider Notes (Signed)
Medical screening examination/treatment/procedure(s) were conducted as a shared visit with non-physician practitioner(s) and myself.  I personally evaluated the patient during the encounter. Briefly, the patient is a 66 y.o. male with a history of seizures who is noncompliant with his antiepileptics presents after tonic-clonic seizure.  Patient is postictal but hemodynamically stable and protecting his airway.  No infectious etiology noted on history taking.  No traumatic injuries.  Patient was loaded with Dilantin and monitored in the emergency department. Pt with prolong post ictal state. Admitted for further management.   EKG Interpretation None           Cardama, Grayce Sessions, MD 04/29/17 [phone]

## 2017-04-30 ENCOUNTER — Observation Stay (HOSPITAL_COMMUNITY): Payer: Medicare Other

## 2017-04-30 DIAGNOSIS — R569 Unspecified convulsions: Secondary | ICD-10-CM

## 2017-04-30 LAB — GLUCOSE, CAPILLARY: Glucose-Capillary: 104 mg/dL — ABNORMAL HIGH (ref 65–99)

## 2017-04-30 MED ORDER — CLONAZEPAM 0.5 MG PO TABS: 0.5000 mg | ORAL_TABLET | Freq: Two times a day (BID) | ORAL | 0 refills | Status: AC

## 2017-04-30 MED ORDER — HYDRALAZINE HCL 20 MG/ML IJ SOLN: 5.0000 mg | Freq: Four times a day (QID) | INTRAMUSCULAR | Status: DC | PRN

## 2017-04-30 NOTE — Plan of Care (Signed)
Pt progressing in education, health behavior, skin integrity.

## 2017-04-30 NOTE — Care Management Note (Signed)
Case Management Note  Patient Details  Name: Noah Rose MRN: [id number] Date of Birth: August 31, 1950  Subjective/Objective: 66 y.o. M to be discharged with HHPT/OT/Aide and CSW. They have chosen [facility] to provide this care and are aware a rep from the agency will be in touch with them within 24-48 hours of discharge to arrange initial visit. [name] notified of referral. No other needs.                    Action/Plan:CM will sign off for now but will be available should additional discharge needs arise or disposition change.    Expected Discharge Date:  04/30/17               Expected Discharge Plan:  [place]  In-House Referral:     Discharge planning Services  CM Consult  Post Acute Care Choice:  Home Health Choice offered to:  Patient, Spouse  DME Arranged:  N/A DME Agency:  NA  HH Arranged:  PT, OT, Nurse's Aide, Social Work [employer] Agency:  [place]  Status of Service:  Completed, signed off  If discussed at [employer], dates discussed:    Additional Comments:  Delrae Sawyers, RN 04/30/2017, 5:46 PM

## 2017-04-30 NOTE — Progress Notes (Signed)
Notified MD of patient's spouse concerns regarding getting patient home this evening after discharge.  Patient does not have a medically necessary reason to continue admission.  Spouse notified of options to use [facility] for home transport or stay another night and incur costs.  Patient and spouse decided to proceed with discharge this evening.  Discharge instructions given. IV and telemetry discontinued.  Patient questions asked and answered.  Patient will transport from unit via wheelchair with staff and spouse will transport home. Wendee Copp

## 2017-04-30 NOTE — Evaluation (Signed)
Physical Therapy Evaluation Patient Details Name: Noah Rose MRN: [id number] DOB: 08-05-1950 Today's Date: 04/30/2017   History of Present Illness  66 y.o. male  with a PMH of seizures, HTN who presents to the Emergency Department for evaluation of witnessed seizure-like activity  Clinical Impression  Orders received for PT evaluation. Patient demonstrates deficits in functional mobility as indicated below. Will benefit from continued skilled PT to address deficits and maximize function. Will see as indicated and progress as tolerated.  Spoke with wife and patient at bedside. Patient with noted instability and remains high fall risk. Patient states "i just don't feel right" however, HR and BP stable. Nsg present, explained concerns IH:[id number] plan. Wife unable to provide adequate assist if patient remains in this condition (unsteady and high fall risk). Recommend ST SNF unless improvements noted in mobility. Will reassess in am.  OF NOTE: patient reports dizziness and sensation of room moving out from under him, VSS, denies spinning sensation.     Follow Up Recommendations SNF;Supervision/Assistance - 24 hour(unless family able to arrange additional assist)    Equipment Recommendations  (TBD)    Recommendations for Other Services       Precautions / Restrictions Precautions Precautions: Fall      Mobility  Bed Mobility Overal bed mobility: Needs Assistance Bed Mobility: Supine to Sit;Sit to Supine     Supine to sit: Min assist Sit to supine: Supervision   General bed mobility comments: min assist to come to upright at EOB, posterior list noted, difficulty maintain balance at EOB  Transfers Overall transfer level: Needs assistance Equipment used: 1 person hand held assist Transfers: Sit to/from Stand Sit to Stand: Min assist         General transfer comment: min assist to power to standing, posterior LOB noted, fall back toward bed, therapist required for  successful attempt to upright  Ambulation/Gait Ambulation/Gait assistance: Min assist(at least 4 episodes of moderate to max assist for LOB) Ambulation Distance (Feet): 120 Feet Assistive device: 1 person hand held assist Gait Pattern/deviations: Step-through pattern;Staggering right;Drifts right/left;Narrow base of support;Decreased stride length;Shuffle Gait velocity: decreased   General Gait Details: patient with several significant LOB requiring moderate to maximial assist to prevent fall to floor.   Stairs Stairs: Yes Stairs assistance: Mod assist Stair Management: Two rails Number of Stairs: 3 General stair comments: Required moderate assist to perform stair negotation, significant LOB on stairs almost bringing patient and therapist to floor. Therapist to tripod position ON LEs to prevent patient from falling. Patient with poor stability and states "i just don't feel right"   Wheelchair Mobility    Modified Rankin (Stroke Patients Only)       Balance Overall balance assessment: Needs assistance Sitting-balance support: Feet supported Sitting balance-Leahy Scale: Poor Sitting balance - Comments: posterior list when sitting EOB Postural control: Posterior lean   Standing balance-Leahy Scale: Poor Standing balance comment: unable to maintain upright consistently                             Pertinent Vitals/Pain Pain Assessment: No/denies pain    Home Living Family/patient expects to be discharged to:: Private residence Living Arrangements: Spouse/significant other Available Help at Discharge: Family;Available 24 hours/day Type of Home: House Home Access: Stairs to enter Entrance Stairs-Rails: Right;Left;Can reach both Entrance Stairs-Number of Steps: 4 Home Layout: Two level;Able to live on main level with bedroom/bathroom;Bed/bath upstairs Home Equipment: None      Prior Function  Level of Independence: Independent               Hand  Dominance   Dominant Hand: Right    Extremity/Trunk Assessment   Upper Extremity Assessment Upper Extremity Assessment: Generalized weakness    Lower Extremity Assessment Lower Extremity Assessment: Generalized weakness(no focal deficits noted, generalized weakness apparent)       Communication   Communication: No difficulties  Cognition Arousal/Alertness: Awake/alert Behavior During Therapy: Flat affect Overall Cognitive Status: Impaired/Different from baseline Area of Impairment: Safety/judgement                         Safety/Judgement: Decreased awareness of safety     General Comments: Patient continues to state "i just dont feel right, you must have given me something"      General Comments      Exercises     Assessment/Plan    PT Assessment Patient needs continued PT services  PT Problem List Decreased strength;Decreased activity tolerance;Decreased balance;Decreased mobility;Decreased coordination;Decreased cognition;Decreased safety awareness       PT Treatment Interventions DME instruction;Gait training;Stair training;Functional mobility training;Therapeutic activities;Therapeutic exercise;Balance training;Neuromuscular re-education;Patient/family education    PT Goals (Current goals can be found in the Care Plan section)  Acute Rehab PT Goals Patient Stated Goal: to feel better PT Goal Formulation: With patient/family Time For Goal Achievement: 05/14/17 Potential to Achieve Goals: Fair    Frequency Min 3X/week   Barriers to discharge Decreased caregiver support wife can not provide adequate assist    Co-evaluation               AM-PAC PT "6 Clicks" Daily Activity  Outcome Measure Difficulty turning over in bed (including adjusting bedclothes, sheets and blankets)?: A Little Difficulty moving from lying on back to sitting on the side of the bed? : A Lot Difficulty sitting down on and standing up from a chair with arms (e.g.,  wheelchair, bedside commode, etc,.)?: Unable Help needed moving to and from a bed to chair (including a wheelchair)?: A Lot Help needed walking in hospital room?: A Lot Help needed climbing 3-5 steps with a railing? : A Lot 6 Click Score: 12    End of Session Equipment Utilized During Treatment: Gait belt Activity Tolerance: Patient limited by fatigue Patient left: in chair;with call bell/phone within reach;with bed alarm set;with family/visitor present Nurse Communication: Mobility status(concern ME:[id number]) PT Visit Diagnosis: Unsteadiness on feet (R26.81);Muscle weakness (generalized) (M62.81)    Time: 9798-9211 PT Time Calculation (min) (ACUTE ONLY): 19 min   Charges:   PT Evaluation $PT Eval Moderate Complexity: 1 Mod     PT G Codes:   PT G-Codes **NOT FOR INPATIENT CLASS** Functional Assessment Tool Used: Clinical judgement Functional Limitation: Mobility: Walking and moving around Mobility: Walking and Moving Around Current Status (H4174): At least 40 percent but less than 60 percent impaired, limited or restricted Mobility: Walking and Moving Around Goal Status [phone]): At least 1 percent but less than 20 percent impaired, limited or restricted    Alben Deeds, PT DPT  Board Certified Neurologic Specialist [place] 04/30/2017, 4:17 PM

## 2017-04-30 NOTE — Discharge Summary (Signed)
Physician Discharge Summary  Noah KAUFHOLD  JYN:[id number]  DOB: 1951/01/04  DOA: 04/29/2017 PCP: Steva Colder, MD  Admit date: 04/29/2017 Discharge date: 04/30/2017  Admitted From: Home  Disposition: Home   Recommendations for Outpatient Follow-up:  1. Follow up with PCP in 1-2 weeks 2. Follow up with Neurology  3. Patient need to close follow up with AED, 4. Patient may benefit from psych eval as outpatient   Home Health: None   Discharge Condition:Stable   CODE STATUS: Full code  Diet recommendation: Heart Healthy / Carb Modified   Brief/Interim Summary: For full details see H&P/progress note but in brief, Noah Rose is a 66 year old male with medical history significant for seizures and hypertension.  We will had a tonic-clonic seizure and foot [place] today.  Patient was brought to the emergency department and was admitted for remaining postictal for a long period of time.  Patient has not been compliant with medication and Dilantin levels were undetectable.  Patient was loaded with Dilantin and place it on oral formulation with no further seizures.  Patient returned back to baseline and declined antiseizure medications at this time.  Discharge Diagnoses/Hospital Course:  Tonic clonic seizures - post ictal  Patient was not taking AED PTA, as patient refused to take his medications due to "side effects" His outpatient neurology, had a long discussion with patient regarding seizure management but patient fells that all his AED make his seizures worse.  CT head was done with no acute abnormalities   Patient was loaded with Dilantin and placed on oral dilantin 300 mg daily. Patient is back to baseline. Case discussed with neurology and recommended continue dilantin and follow up with outpatient neurologist.  I have a long discussion with patient, and he report that non of the AED are helping him that is just killing him slowly. Patient refuses to take AED. It is my strong  recommendation to take anti-seizure medications as uncontrolled seizures can be dangerous to your health, patient decline despite my strong recommendation. Patient wife state that patient has been adamant on not taking any of his medications as this are dangerous to him.   Given anxiety patient was advise to take Klonopin this may help, a may have some minor effect on seizure as well. Patient agrees to take Klonipin 0.5 mg BID. Patient is strongly encourage to follow up with his neurology ASAP. Patient complained of congestion, CXR negative as well a physical exam.    HTN - patient also not compliant with this medications  Home medications where resumed and BP was well controlled  Again advised to adhere to medical therapy  Follow up with PCP   Anxiety  Will send on Klonopin 0.5 mg BID, hopefully this help with anxiety/paranoia and allow neurologic team to start patient on AED. May have some minimal beneficial effect on seizure as well.  Need outpatient psych   All other chronic medical condition were stable during the hospitalization.  Patient was seen by physical therapy, recommending SNF, but patient refusing, set up [facility] PT/OT and Aid On the day of the discharge the patient's vitals were stable, and no other acute medical condition were reported by patient. Patient was felt safe to be discharge to home   Discharge Instructions  You were cared for by a hospitalist during your hospital stay. If you have any questions about your discharge medications or the care you received while you were in the hospital after you are discharged, you can call the unit  and asked to speak with the hospitalist on call if the hospitalist that took care of you is not available. Once you are discharged, your primary care physician will handle any further medical issues. Please note that NO REFILLS for any discharge medications will be authorized once you are discharged, as it is imperative that you return to your  primary care physician (or establish a relationship with a primary care physician if you do not have one) for your aftercare needs so that they can reassess your need for medications and monitor your lab values.  Discharge Instructions    Diet - low sodium heart healthy   Complete by:  As directed    Discharge instructions   Complete by:  As directed    Per [facility] statutes, patients with seizures are not allowed to drive until they have been seizure-free for six months.   Use caution when using heavy equipment or power tools. Avoid working on ladders or at heights. Take showers instead of baths. Ensure the water temperature is not too high on the home water heater. Do not go swimming alone. Do not lock yourself in a room alone (i.e. bathroom). When caring for infants or small children, sit down when holding, feeding, or changing them to minimize risk of injury to the child in the event you have a seizure. Maintain good sleep hygiene. Avoid alcohol.   If patienthas another seizure, call 911 and bring them back to the ED if: A. The seizure lasts longer than 5 minutes.  B. The patient doesn't wake shortly after the seizure or has new problems such as difficulty seeing, speaking or moving following the seizure C. The patient was injured during the seizure D. The patient has a temperature over 102 F (39C) E. The patient vomited during the seizure and now is having trouble breathing   Increase activity slowly   Complete by:  As directed      Allergies as of 04/30/2017      Reactions   Almond Oil Shortness Of Breath   Corn Oil Shortness Of Breath   Dextroamphetamine Shortness Of Breath   dizziness   Dust Mite Mixed Allergen Ext [mite (d. Farinae)] Shortness Of Breath   Salicylates Shortness Of Breath, Other (See Comments)   Other reaction(s): Other (See Comments), Syncope   Amlodipine    Other reaction(s): Other (See Comments) Other Reaction: PATIENT STATES HE CANNOT  TOLER dizziness   Amphetamines    dizziness   Aspirin    Other reaction(s): Unknown dizziness   Beet [beta Vulgaris] Other (See Comments)   Congestion and sneezing   Cantaloupe (diagnostic) Other (See Comments)   Congestion and sneezing   Carrot [daucus Carota] Other (See Comments)   Congestion and sneezing   Cheese    American cheddar   Corn-containing Products Other (See Comments)   Congestion and sneezing   Felodipine    Other reaction(s): Other (See Comments) Other Reaction: PATIENT STATES HE CANNOT TOLER dizziness   Fructose Other (See Comments)   Congestion and sneezing   Green Dyes Other (See Comments)   #3 Congestion and sneezing   Indapamide    Other reaction(s): Other (See Comments) Other Reaction: PATIENT STATES HE CANNOT TOLER dizziness   Iodinated Diagnostic Agents Other (See Comments)   Other reaction(s): Unknown Uncoded Allergy. Allergen: Preservatives, Other Reaction: HTN CRISIS Uncoded Allergy. Allergen: ANTICONVULSANTS Uncoded Allergy. Allergen: Preservatives, Other Reaction: HTN CRISIS Uncoded Allergy. Allergen: ANTICONVULSANTS   Lambs Quarters    lamb  Milk-related Compounds Other (See Comments)   Congestion and sneezing   Madelaine Bhat Isothiocyanate] Other (See Comments)   Congestion and sneezing   Other Other (See Comments)   Other reaction(s): Unknown Uncoded Allergy. Allergen: ANTICONVULSANTS dizziness Other reaction(s): Unknown Uncoded Allergy. Allergen: ALCOHOL COUGH MEDICINE Other reaction(s): Unknown Numerous food allergies A lot of preservatives and medicine ingredients   Pineapple Other (See Comments)   Congestion and sneezing   Plum Pulp Other (See Comments)   Congestion and sneezing   Raspberry Other (See Comments)   Congestion and sneezing   Red Dye Other (See Comments)   #40 Congestion and sneezing   Salicylic Acid    Other reaction(s): Other (See Comments) Many issues   Scallops [shellfish Allergy] Other (See  Comments)   Congestion and sneezing   Tea Other (See Comments)   Congestion and sneezing   Vanilla Other (See Comments)   Congestion and sneezing   Whey Other (See Comments)   Congestion and sneezing      Medication List    TAKE these medications   clonazePAM 0.5 MG tablet Commonly known as:  KLONOPIN Take 1 tablet (0.5 mg total) 2 (two) times daily by mouth. What changed:    when to take this  reasons to take this   cloNIDine 0.2 MG tablet Commonly known as:  CATAPRES Take 0.2 tablets by mouth 2 (two) times daily.   finasteride 5 MG tablet Commonly known as:  PROSCAR Take 5 mg by mouth daily.   lisinopril 20 MG tablet Commonly known as:  PRINIVIL,ZESTRIL Take 10 mg by mouth.   NETI POT SINUS [place] NA Place 1 Applicatorful into the nose daily.   phenytoin 300 MG ER capsule Commonly known as:  DILANTIN Take 1 capsule (300 mg total) by mouth at bedtime.       Allergies  Allergen Reactions  . Almond Oil Shortness Of Breath  . Corn Oil Shortness Of Breath  . Dextroamphetamine Shortness Of Breath    dizziness  . Dust Mite Mixed Allergen Ext [Mite (D. Farinae)] Shortness Of Breath  . Salicylates Shortness Of Breath and Other (See Comments)    Other reaction(s): Other (See Comments), Syncope  . Amlodipine     Other reaction(s): Other (See Comments) Other Reaction: PATIENT STATES HE CANNOT TOLER dizziness  . Amphetamines     dizziness  . Aspirin     Other reaction(s): Unknown dizziness  . Beet [Beta Vulgaris] Other (See Comments)    Congestion and sneezing  . Cantaloupe (Diagnostic) Other (See Comments)    Congestion and sneezing  . Carrot [Daucus Carota] Other (See Comments)    Congestion and sneezing  . Cheese     American cheddar  . Corn-Containing Products Other (See Comments)    Congestion and sneezing  . Felodipine     Other reaction(s): Other (See Comments) Other Reaction: PATIENT STATES HE CANNOT TOLER dizziness  . Fructose Other (See  Comments)    Congestion and sneezing  . Green Dyes Other (See Comments)    #3 Congestion and sneezing  . Indapamide     Other reaction(s): Other (See Comments) Other Reaction: PATIENT STATES HE CANNOT TOLER dizziness  . Iodinated Diagnostic Agents Other (See Comments)    Other reaction(s): Unknown Uncoded Allergy. Allergen: Preservatives, Other Reaction: HTN CRISIS Uncoded Allergy. Allergen: ANTICONVULSANTS Uncoded Allergy. Allergen: Preservatives, Other Reaction: HTN CRISIS Uncoded Allergy. Allergen: ANTICONVULSANTS  . Lambs Quarters     lamb  . Milk-Related Compounds Other (See Comments)  Congestion and sneezing  . Madelaine Bhat Isothiocyanate] Other (See Comments)    Congestion and sneezing  . Other Other (See Comments)    Other reaction(s): Unknown Uncoded Allergy. Allergen: ANTICONVULSANTS dizziness Other reaction(s): Unknown Uncoded Allergy. Allergen: ALCOHOL COUGH MEDICINE Other reaction(s): Unknown Numerous food allergies A lot of preservatives and medicine ingredients  . Pineapple Other (See Comments)    Congestion and sneezing  . Plum Pulp Other (See Comments)    Congestion and sneezing  . Raspberry Other (See Comments)    Congestion and sneezing  . Red Dye Other (See Comments)    #40 Congestion and sneezing  . Salicylic Acid     Other reaction(s): Other (See Comments) Many issues  . Scallops [Shellfish Allergy] Other (See Comments)    Congestion and sneezing  . Tea Other (See Comments)    Congestion and sneezing  . Vanilla Other (See Comments)    Congestion and sneezing  . Whey Other (See Comments)    Congestion and sneezing    Consultations:  None    Procedures/Studies: Ct Head Wo Contrast  Result Date: 04/29/2017 CLINICAL DATA:  Witnessed seizure today. EXAM: CT HEAD WITHOUT CONTRAST TECHNIQUE: Contiguous axial images were obtained from the base of the skull through the vertex without intravenous contrast. COMPARISON:  01/11/2017 FINDINGS:  Brain: No evidence of acute infarction, hemorrhage, hydrocephalus, extra-axial collection or mass lesion/mass effect. Vascular: No hyperdense vessel or unexpected calcification. Skull: Normal. Negative for fracture or focal lesion. Sinuses/Orbits: Globes and orbits are unremarkable. Mild right maxillary and bilateral ethmoid sinus mucosal thickening. Mild mucosal thickening in the inferior right frontal sinus. Clear mastoid air cells and middle ear cavities. Other: None. IMPRESSION: 1. No acute intracranial abnormalities. Electronically Signed   By: Lajean Manes M.D.   On: 04/29/2017 17:05   Dg Chest Port 1 View  Result Date: 04/30/2017 CLINICAL DATA:  Upper airway congestion. EXAM: PORTABLE CHEST 1 VIEW COMPARISON:  03/15/2016 FINDINGS: Cardiac silhouette is mildly enlarged. Aorta is prominent and uncoiled. No mediastinal or hilar masses or convincing adenopathy. Small calcified nodes are noted along the left hilum, stable. Calcified granuloma in the left lower lung, stable. Lungs otherwise clear. No convincing pleural effusion.  No pneumothorax. Skeletal structures are demineralized. There is an old stable healed left rib fracture. IMPRESSION: 1. No acute cardiopulmonary disease. Electronically Signed   By: Lajean Manes M.D.   On: 04/30/2017 13:23    Discharge Exam: Vitals:   04/30/17 1018 04/30/17 1406  BP: 131/86 105/72  Pulse: 66 (!) 56  Resp: 20 19  Temp: 98.5 F (36.9 C) 98.4 F (36.9 C)  SpO2: 98% 99%   Vitals:   04/30/17 0140 04/30/17 0400 04/30/17 1018 04/30/17 1406  BP: (!) 164/77 (!) 143/95 131/86 105/72  Pulse: 69 63 66 (!) 56  Resp: 16 18 20 19   Temp:  98 F (36.7 C) 98.5 F (36.9 C) 98.4 F (36.9 C)  TempSrc:  Oral Oral Oral  SpO2: 100% 99% 98% 99%  Height:        General: NAD  Cardiovascular: RRR, S1/S2 +, no rubs, no gallops Respiratory: CTA bilaterally, no wheezing, no rhonchi Abdominal: Soft, NT, ND, bowel sounds + Neuro: No focal findings, AAOx4   Extremities: no edema   The results of significant diagnostics from this hospitalization (including imaging, microbiology, ancillary and laboratory) are listed below for reference.     Microbiology: No results found for this or any previous visit (from the past 240 hour(s)).   Labs: BNP (  last 3 results) No results for input(s): BNP in the last 8760 hours. Basic Metabolic Panel: Recent Labs  Lab 04/29/17 1543  NA 136  K 3.5  CL 102  CO2 19*  GLUCOSE 81  BUN 18  CREATININE 1.20  CALCIUM 8.7*  MG 2.9*   Liver Function Tests: No results for input(s): AST, ALT, ALKPHOS, BILITOT, PROT, ALBUMIN in the last 168 hours. No results for input(s): LIPASE, AMYLASE in the last 168 hours. No results for input(s): AMMONIA in the last 168 hours. CBC: Recent Labs  Lab 04/29/17 1543  WBC 6.6  HGB 15.0  HCT 44.9  MCV 83.5  PLT 172   Cardiac Enzymes: No results for input(s): CKTOTAL, CKMB, CKMBINDEX, TROPONINI in the last 168 hours. BNP: Invalid input(s): POCBNP CBG: Recent Labs  Lab 04/29/17 1658 04/29/17 2204 04/30/17 0020  GLUCAP 63* 105* 104*   D-Dimer No results for input(s): DDIMER in the last 72 hours. Hgb A1c No results for input(s): HGBA1C in the last 72 hours. Lipid Profile No results for input(s): CHOL, HDL, LDLCALC, TRIG, CHOLHDL, LDLDIRECT in the last 72 hours. Thyroid function studies No results for input(s): TSH, T4TOTAL, T3FREE, THYROIDAB in the last 72 hours.  Invalid input(s): FREET3 Anemia work up No results for input(s): VITAMINB12, FOLATE, FERRITIN, TIBC, IRON, RETICCTPCT in the last 72 hours. Urinalysis    Component Value Date/Time   COLORURINE YELLOW 04/23/2016 1921   APPEARANCEUR Clear 07/26/2016 1031   LABSPEC 1.012 04/23/2016 1921   PHURINE 8.0 04/23/2016 1921   GLUCOSEU Negative 07/26/2016 1031   HGBUR MODERATE (A) 04/23/2016 1921   BILIRUBINUR Negative 07/26/2016 1031   KETONESUR NEGATIVE 04/23/2016 1921   PROTEINUR 1+ (A) 07/26/2016  1031   PROTEINUR NEGATIVE 04/23/2016 1921   UROBILINOGEN 0.2 01/01/2015 0415   NITRITE Negative 07/26/2016 1031   NITRITE NEGATIVE 04/23/2016 1921   LEUKOCYTESUR Negative 07/26/2016 1031   Sepsis Labs Invalid input(s): PROCALCITONIN,  WBC,  LACTICIDVEN Microbiology No results found for this or any previous visit (from the past 240 hour(s)).   Time coordinating discharge: 30 minutes  SIGNED:  Chipper Oman, MD  [facility] 04/30/2017, 3:42 PM  Pager please text page via  [email or web address] Password [email or web address]

## 2017-06-08 ENCOUNTER — Ambulatory Visit (HOSPITAL_COMMUNITY)
Admission: EM | Admit: 2017-06-08 | Discharge: 2017-06-08 | Disposition: A | Discharge status: Home / Self Care | Payer: Medicare Other

## 2017-06-08 ENCOUNTER — Other Ambulatory Visit: Payer: Self-pay

## 2017-06-08 ENCOUNTER — Encounter (HOSPITAL_COMMUNITY): Payer: Self-pay | Admitting: Family Medicine

## 2017-06-08 ENCOUNTER — Emergency Department (HOSPITAL_COMMUNITY)
Admission: EM | Admit: 2017-06-08 | Discharge: 2017-06-08 | Disposition: A | Discharge status: Home / Self Care | Payer: Medicare Other | Attending: Emergency Medicine | Admitting: Emergency Medicine

## 2017-06-08 ENCOUNTER — Encounter (HOSPITAL_COMMUNITY): Payer: Self-pay | Admitting: Emergency Medicine

## 2017-06-08 ENCOUNTER — Emergency Department (HOSPITAL_COMMUNITY): Payer: Medicare Other

## 2017-06-08 DIAGNOSIS — I10 Essential (primary) hypertension: Secondary | ICD-10-CM | POA: Diagnosis not present

## 2017-06-08 DIAGNOSIS — M79605 Pain in left leg: Secondary | ICD-10-CM | POA: Diagnosis not present

## 2017-06-08 DIAGNOSIS — H05223 Edema of bilateral orbit: Secondary | ICD-10-CM

## 2017-06-08 DIAGNOSIS — R51 Headache: Secondary | ICD-10-CM | POA: Diagnosis not present

## 2017-06-08 DIAGNOSIS — M25512 Pain in left shoulder: Secondary | ICD-10-CM | POA: Diagnosis not present

## 2017-06-08 DIAGNOSIS — H11421 Conjunctival edema, right eye: Secondary | ICD-10-CM | POA: Insufficient documentation

## 2017-06-08 DIAGNOSIS — R079 Chest pain, unspecified: Secondary | ICD-10-CM | POA: Insufficient documentation

## 2017-06-08 DIAGNOSIS — Z5321 Procedure and treatment not carried out due to patient leaving prior to being seen by health care provider: Secondary | ICD-10-CM | POA: Diagnosis not present

## 2017-06-08 LAB — BASIC METABOLIC PANEL
ANION GAP: 7 (ref 5–15)
BUN: 16 mg/dL (ref 6–20)
CALCIUM: 8.8 mg/dL — AB (ref 8.9–10.3)
CO2: 27 mmol/L (ref 22–32)
Chloride: 102 mmol/L (ref 101–111)
Creatinine, Ser: 1.05 mg/dL (ref 0.61–1.24)
Glucose, Bld: 86 mg/dL (ref 65–99)
POTASSIUM: 3.8 mmol/L (ref 3.5–5.1)
SODIUM: 136 mmol/L (ref 135–145)

## 2017-06-08 LAB — CBC
HEMATOCRIT: 43.8 % (ref 39.0–52.0)
HEMOGLOBIN: 14.8 g/dL (ref 13.0–17.0)
MCH: 28 pg (ref 26.0–34.0)
MCHC: 33.8 g/dL (ref 30.0–36.0)
MCV: 83 fL (ref 78.0–100.0)
Platelets: 166 10*3/uL (ref 150–400)
RBC: 5.28 MIL/uL (ref 4.22–5.81)
RDW: 14.3 % (ref 11.5–15.5)
WBC: 6.9 10*3/uL (ref 4.0–10.5)

## 2017-06-08 LAB — I-STAT TROPONIN, ED: TROPONIN I, POC: 0 ng/mL (ref 0.00–0.08)

## 2017-06-08 NOTE — Discharge Instructions (Signed)
TO go to the ED for evaluation of eye swelling and discoloration

## 2017-06-08 NOTE — ED Notes (Signed)
Pt does not answer when called in waiting area x 3

## 2017-06-08 NOTE — ED Provider Notes (Signed)
[place]    CSN: [id number] Arrival date & time: 06/08/17  1717     History   Chief Complaint Chief Complaint  Patient presents with  . Headache    HPI Noah Rose is a 66 y.o. male.   66 year old male who has a history of hypertension, poorly controlled, allergies to several medications and environmental and food substances, CKD III, dizziness, altered consciousness, anxiety disorder, OSA states that this morning he awoke with swelling around the eyes, dilated blood vessels in the forehead, hard knots across the forehead discoloration around the eyes. He called his doctor at the [facility] and was advised to seek medical attention promptly.  His history was very complicated and convoluted in regards to his reason for being here. He would want to tangents about other times that he was ill and including no symptoms in today's problem and past incidences of illness and symptoms that he also included in his history of present illness. Was very difficult for the examiner to determine the specific reason why the patient was here this afternoon and what symptoms he is currently having. His wife is present. He denies confusion, disorientation or problems with memory. Denies problems with vision, speech, hearing, swallowing, focal paresthesias or weakness, GI, GU symptoms, headache or current dizziness.  He expresses extreme concern regarding discoloration around his eyes and the lumps and dilated blood vessels of the forehead and temple. He is to term and to have a diagnosis for this and to make sure everything else is normal.      Past Medical History:  Diagnosis Date  . Anxiety   . Dizziness   . High blood pressure   . Hypertension   . Multiple allergies   . Seizures [facility])     Patient Active Problem List   Diagnosis Date Noted  . Post-ictal state ([place]) 04/29/2017  . CKD (chronic kidney disease), stage III ([place]) 01/11/2017  . History of  nonadherence to medical treatment 06/08/2016  . BPH (benign prostatic hyperplasia) 04/24/2016  . Delirium   . Hyponatremia 04/23/2016  . Microscopic hematuria 04/23/2016  . Urinary retention: Probable 03/17/2016  . Sinusitis, acute, maxillary: Right 03/16/2016  . Convulsions ([place]) 03/16/2016  . Facial fracture ([place]) 03/15/2016  . Leukocytosis 03/15/2016  . Hypertensive urgency   . Orbital fracture ([place])   . Seizure ([place])   . Spells 06/03/2015  . Medication intolerance 04/03/2014  . Hypoglycemia 11/26/2013  . 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 Nasal turbinate hypertrophy 09/25/2013  . Nuclear sclerosis 05/08/2013  . Nasal congestion 04/15/2013  . Nocturia 04/02/2013  . Increased frequency of urination 04/02/2013  . Complex partial seizure ([place]) 11/05/2012  . Alteration consciousness 09/05/2012  . Syncope and collapse 07/28/2012  . Hypertension 07/28/2012  . Obstructive sleep apnea 07/28/2012  . Allergic rhinitis 07/28/2012  . Chronic anxiety 07/28/2012  . 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 Anxiety disorder 07/28/2012  . Dizziness 05/07/2012  . Breathing problem 01/21/2011  . Salicylate allergy 51/88/4166    Past Surgical History:  Procedure Laterality Date  . HERNIA REPAIR  02/2016       Home Medications    Prior to Admission medications   Medication Sig Start Date End Date Taking? Authorizing Provider  clonazePAM (KLONOPIN) 0.5 MG tablet Take 1 tablet (0.5 mg total) 2 (two) times daily by mouth. 04/30/17   Doreatha Lew, MD  cloNIDine (CATAPRES) 0.2 MG  tablet Take 0.2 tablets by mouth 2 (two) times daily. 03/01/17   [provider]  finasteride (PROSCAR) 5 MG tablet Take 5 mg by mouth daily.    [provider]  lisinopril (PRINIVIL,ZESTRIL) 20 MG tablet Take 10 mg by mouth.  03/01/16   [provider]  phenytoin (DILANTIN) 300 MG ER  capsule Take 1 capsule (300 mg total) by mouth at bedtime. 01/13/17   Hosie Poisson, MD  Sodium Chloride-Sodium Bicarb (NETI POT SINUS [place] NA) Place 1 Applicatorful into the nose daily.    [provider]    Family History Family History  Problem Relation Age of Onset  . Dementia Mother   . Heart disease Father   . Parkinson's disease Sister   . Diabetes Sister   . Pancreatic cancer Sister   . Leukemia Sister     Social History Social History   Tobacco Use  . Smoking status: Never Smoker  . Smokeless tobacco: Never Used  Substance Use Topics  . Alcohol use: No  . Drug use: No     Allergies   Almond oil; Corn oil; Dextroamphetamine; Dust mite mixed allergen ext [mite (d. farinae)]; Salicylates; Amlodipine; Amphetamines; Aspirin; Beet [beta vulgaris]; Cantaloupe (diagnostic); Carrot [daucus carota]; Cheese; Corn-containing products; Felodipine; Fructose; Green dyes; Indapamide; Iodinated diagnostic agents; Lambs quarters; Milk-related compounds; Mustard [allyl isothiocyanate]; Other; Pineapple; Plum pulp; Raspberry; Red dye; Salicylic acid; Scallops [shellfish allergy]; Tea; Vanilla; and Whey   Review of Systems Review of Systems  Constitutional: Negative for activity change, appetite change, fatigue and fever.  HENT: Negative for congestion, postnasal drip and sore throat.   Eyes: Negative for pain, discharge and visual disturbance.       Perioral swelling and darkening of the skin  Respiratory: Negative.   Cardiovascular: Negative.   Gastrointestinal: Negative.   Genitourinary: Negative.   Skin: Positive for color change.  Neurological: Negative for dizziness, tremors, speech difficulty and headaches.  All other systems reviewed and are negative.    Physical Exam Triage Vital Signs ED Triage Vitals  Enc Vitals Group     BP 06/08/17 1739 (!) 196/108     Pulse Rate 06/08/17 1739 65     Resp 06/08/17 1739 18     Temp 06/08/17 1739 98.6 F (37 C)     Temp  src --      SpO2 06/08/17 1739 100 %     Weight --      Height --      Head Circumference --      Peak Flow --      Pain Score 06/08/17 1737 8     Pain Loc --      Pain Edu? --      Excl. in [place]? --    No data found.  Updated Vital Signs BP (!) 196/108   Pulse 65   Temp 98.6 F (37 C)   Resp 18   SpO2 100%   Visual Acuity Right Eye Distance:   Left Eye Distance:   Bilateral Distance:    Right Eye Near:   Left Eye Near:    Bilateral Near:     Physical Exam  Constitutional: He appears well-developed and well-nourished.  Non-toxic appearance. He does not appear ill.  HENT:  There are a few small dilated pains in the forehead and temples however I do not consider this especially abnormal. Patient does have a orbital edema bilaterally with some darkening of skin tone. The patient states that he has developed  knots across the rales and lower forehead. These are not especially appreciated by the examiner.  Eyes: EOM are normal. Pupils are equal, round, and reactive to light.  Neck: Normal range of motion. Neck supple.  Cardiovascular: Normal rate, regular rhythm and normal heart sounds.  Pulmonary/Chest: Effort normal and breath sounds normal. No respiratory distress.  Neurological: He is alert. He has normal strength. GCS eye subscore is 4. GCS verbal subscore is 5. GCS motor subscore is 6.  Skin: Skin is warm and dry.  Psychiatric: He has a normal mood and affect.  Nursing note and vitals reviewed.    UC Treatments / Results  Labs (all labs ordered are listed, but only abnormal results are displayed) Labs Reviewed - No data to display  EKG  EKG Interpretation None       Radiology No results found.  Procedures Procedures (including critical care time)  Medications Ordered in UC Medications - No data to display   Initial Impression / Assessment and Plan / UC Course  I have reviewed the triage vital signs and the nursing notes.  Pertinent labs & imaging  results that were available during my care of the patient were reviewed by me and considered in my medical decision making (see chart for details).   history patient gave was somewhat complicated. He does have elevated blood pressure as well as periorbital edema and darkening of the skin. I do not appreciate any other abnormalities. The patient is very concerned about the symptoms and wants additional evaluation. It is not certain as to what is causing the symptoms. It is reassuring that his examination is at baseline and essentially normal. Because of the patient's desire for reassurance based on additional testing he will be sent to the emergency department.    Final Clinical Impressions(s) / UC Diagnoses   Final diagnoses:  Essential hypertension  Edema of orbit, bilateral    ED Discharge Orders    None       Controlled Substance Prescriptions [place] Controlled Substance Registry consulted? Not Applicable   Janne Napoleon, NP 06/08/17 [phone]

## 2017-06-08 NOTE — ED Provider Notes (Signed)
Patient was listed as coming back to [facility].  I had reviewed his chart in anticipation of seeing the patient.  Patient has not been placed in a room.  Now is shown as OTF.  Never roomed.   Charlesetta Shanks, MD 06/08/17 2251

## 2017-06-08 NOTE — ED Triage Notes (Signed)
Pt here for tightness in head and darkening around right eye. Denies falling. Reports that he has been seeing [place] specialist. He believes that his BP is elevated.

## 2017-06-08 NOTE — ED Triage Notes (Addendum)
Pt states sent from urgent care for evlauation of hypertension and swelling to right eye. Pt states he has had the swelling around his eyes before, including during a recent hospital admission for dizziness. Pt takes Lisinopril, and several other BP meds. Difficult to triage because he is talking about things that happened in the past and is not straight forwards on his symptoms today. Alert and oriented X4.  On further assessment he does report he has a headache and pain to left shoulder/left chest/left arm. And pain to left leg.

## 2017-06-23 ENCOUNTER — Emergency Department (HOSPITAL_COMMUNITY)
Admission: EM | Admit: 2017-06-23 | Discharge: 2017-06-23 | Disposition: A | Discharge status: Home / Self Care | Payer: Non-veteran care | Attending: Emergency Medicine | Admitting: Emergency Medicine

## 2017-06-23 ENCOUNTER — Other Ambulatory Visit: Payer: Self-pay

## 2017-06-23 ENCOUNTER — Encounter (HOSPITAL_COMMUNITY): Payer: Self-pay | Admitting: Emergency Medicine

## 2017-06-23 ENCOUNTER — Emergency Department (HOSPITAL_COMMUNITY): Payer: Non-veteran care

## 2017-06-23 DIAGNOSIS — S0083XA Contusion of other part of head, initial encounter: Secondary | ICD-10-CM

## 2017-06-23 DIAGNOSIS — Z79899 Other long term (current) drug therapy: Secondary | ICD-10-CM | POA: Diagnosis not present

## 2017-06-23 DIAGNOSIS — Y999 Unspecified external cause status: Secondary | ICD-10-CM | POA: Insufficient documentation

## 2017-06-23 DIAGNOSIS — I10 Essential (primary) hypertension: Secondary | ICD-10-CM | POA: Insufficient documentation

## 2017-06-23 DIAGNOSIS — Y9389 Activity, other specified: Secondary | ICD-10-CM | POA: Insufficient documentation

## 2017-06-23 DIAGNOSIS — S0990XA Unspecified injury of head, initial encounter: Secondary | ICD-10-CM | POA: Diagnosis present

## 2017-06-23 DIAGNOSIS — R569 Unspecified convulsions: Secondary | ICD-10-CM | POA: Insufficient documentation

## 2017-06-23 DIAGNOSIS — X58XXXA Exposure to other specified factors, initial encounter: Secondary | ICD-10-CM | POA: Insufficient documentation

## 2017-06-23 DIAGNOSIS — Y929 Unspecified place or not applicable: Secondary | ICD-10-CM | POA: Diagnosis not present

## 2017-06-23 LAB — CBC WITH DIFFERENTIAL/PLATELET
BASOS PCT: 0 %
Basophils Absolute: 0 10*3/uL (ref 0.0–0.1)
EOS PCT: 1 %
Eosinophils Absolute: 0.1 10*3/uL (ref 0.0–0.7)
HCT: 47.8 % (ref 39.0–52.0)
Hemoglobin: 15.7 g/dL (ref 13.0–17.0)
LYMPHS ABS: 1.1 10*3/uL (ref 0.7–4.0)
Lymphocytes Relative: 13 %
MCH: 27 pg (ref 26.0–34.0)
MCHC: 32.8 g/dL (ref 30.0–36.0)
MCV: 82.3 fL (ref 78.0–100.0)
MONO ABS: 0.4 10*3/uL (ref 0.1–1.0)
MONOS PCT: 5 %
Neutro Abs: 6.6 10*3/uL (ref 1.7–7.7)
Neutrophils Relative %: 81 %
PLATELETS: 179 10*3/uL (ref 150–400)
RBC: 5.81 MIL/uL (ref 4.22–5.81)
RDW: 14 % (ref 11.5–15.5)
WBC: 8.2 10*3/uL (ref 4.0–10.5)

## 2017-06-23 LAB — CBG MONITORING, ED: GLUCOSE-CAPILLARY: 160 mg/dL — AB (ref 65–99)

## 2017-06-23 LAB — BASIC METABOLIC PANEL
Anion gap: 7 (ref 5–15)
BUN: 12 mg/dL (ref 6–20)
CALCIUM: 8.9 mg/dL (ref 8.9–10.3)
CHLORIDE: 101 mmol/L (ref 101–111)
CO2: 26 mmol/L (ref 22–32)
CREATININE: 1.11 mg/dL (ref 0.61–1.24)
GFR calc Af Amer: >60 mL/min (ref >60)
GFR calc non Af Amer: >60 mL/min (ref >60)
Glucose, Bld: 164 mg/dL — ABNORMAL HIGH (ref 65–99)
Potassium: 3.7 mmol/L (ref 3.5–5.1)
Sodium: 134 mmol/L — ABNORMAL LOW (ref 135–145)
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
## 2017-07-19 ENCOUNTER — Telehealth: Payer: Self-pay | Admitting: Urology

## 2017-07-19 DIAGNOSIS — R3129 Other microscopic hematuria: Secondary | ICD-10-CM

## 2017-07-19 NOTE — Telephone Encounter (Signed)
Patient is asking for a refill on his Finasteride, his last app was 06-2016 and he has a follow up for labs on 08-14-17 and follow up app 08-16-17. I told him I didn't know since it has been a year but I would ask.  Please call the patient back   [place]

## 2017-07-21 MED ORDER — FINASTERIDE 5 MG PO TABS: 5.0000 mg | ORAL_TABLET | Freq: Every day | ORAL | 0 refills | Status: DC

## 2017-07-21 NOTE — Telephone Encounter (Signed)
30 days and no refills given.

## 2017-08-14 ENCOUNTER — Other Ambulatory Visit: Payer: Self-pay

## 2017-08-14 ENCOUNTER — Other Ambulatory Visit: Payer: Medicare Other

## 2017-08-14 DIAGNOSIS — N401 Enlarged prostate with lower urinary tract symptoms: Secondary | ICD-10-CM

## 2017-08-15 LAB — PSA: Prostate Specific Ag, Serum: 1.5 ng/mL (ref 0.0–4.0)

## 2017-08-16 ENCOUNTER — Ambulatory Visit (INDEPENDENT_AMBULATORY_CARE_PROVIDER_SITE_OTHER): Payer: Medicare Other | Admitting: Urology

## 2017-08-16 ENCOUNTER — Encounter: Payer: Self-pay | Admitting: Urology

## 2017-08-16 VITALS — BP 108/63 | HR 56 | Ht 69.0 in | Wt 180.0 lb

## 2017-08-16 DIAGNOSIS — N400 Enlarged prostate without lower urinary tract symptoms: Secondary | ICD-10-CM

## 2017-08-16 DIAGNOSIS — R339 Retention of urine, unspecified: Secondary | ICD-10-CM | POA: Diagnosis not present

## 2017-08-16 LAB — BLADDER SCAN AMB NON-IMAGING

## 2017-08-16 NOTE — Progress Notes (Signed)
08/16/2017 9:31 AM   Noah Rose 04-01-1951 [id number] Rectal: Normal sphincter tone.  Enlarged, nontender, no nodules. Referring provider: Renford Dills, [place], [place] [id number]  Chief Complaint  Patient presents with  . Benign Prostatic Hypertrophy    HPI: 67 yo M With history of BPH/incomplete bladder emptying who returns today for routine annual follow-up.  Today, he reports he is actually done quite well over the past year.  His wife provides majority of history today as the patient is somewhat of a poor historian.  She believes that he is voiding better with an improved stream than previously.  He gets up anywhere from 0-2 times nightly.  No significant daytime frequency or urgency.  No incontinence.  His postvoid residual today is also only 80 cc were been previously more elevated.  He continues on finasteride.  He previously tried Flomax but had an episode of hypotension which may or may not have been related to this medication as he started an additional seizure medication around the same time.   No family history of prostate cancer.  Most recent PSA on 08/14/2017 1.5.  He does have a history of microscopic hematuria status post workup in 2018.    He does have a personal history of nonobstructing left renal calculi.  CT urogram in 2018.  He had 11 mm left lower pole stone which was asymptomatic.  PMH: Past Medical History:  Diagnosis Date  . Anxiety   . Dizziness   . High blood pressure   . Hypertension   . Multiple allergies   . Seizures [facility])     Surgical History: Past Surgical History:  Procedure Laterality Date  . HERNIA REPAIR  02/2016    Home Medications:  Allergies as of 08/16/2017      Reactions   Almond Oil Shortness Of Breath   Corn Oil Shortness Of Breath   Dextroamphetamine Shortness Of Breath   dizziness   Dust Mite Mixed Allergen Ext [mite (d. Farinae)] Shortness Of Breath   Salicylates Shortness Of Breath, Other  (See Comments)   Other reaction(s): Other (See Comments), Syncope   Amlodipine    Other reaction(s): Other (See Comments) Other Reaction: PATIENT STATES HE CANNOT TOLER dizziness   Amphetamines    dizziness   Aspirin    Other reaction(s): Unknown dizziness   Beet [beta Vulgaris] Other (See Comments)   Congestion and sneezing   Cantaloupe (diagnostic) Other (See Comments)   Congestion and sneezing   Carrot [daucus Carota] Other (See Comments)   Congestion and sneezing   Cheese    American cheddar   Corn-containing Products Other (See Comments)   Congestion and sneezing   Felodipine    Other reaction(s): Other (See Comments) Other Reaction: PATIENT STATES HE CANNOT TOLER dizziness   Fructose Other (See Comments)   Congestion and sneezing   Green Dyes Other (See Comments)   #3 Congestion and sneezing   Indapamide    Other reaction(s): Other (See Comments) Other Reaction: PATIENT STATES HE CANNOT TOLER dizziness   Iodinated Diagnostic Agents Other (See Comments)   Other reaction(s): Unknown Uncoded Allergy. Allergen: Preservatives, Other Reaction: HTN CRISIS Uncoded Allergy. Allergen: ANTICONVULSANTS Uncoded Allergy. Allergen: Preservatives, Other Reaction: HTN CRISIS Uncoded Allergy. Allergen: ANTICONVULSANTS   Lambs Quarters    lamb   Milk-related Compounds Other (See Comments)   Congestion and sneezing   Mustard [allyl Isothiocyanate] Other (See Comments)   Congestion and sneezing   Other Other (See Comments)  Other reaction(s): Unknown Uncoded Allergy. Allergen: ANTICONVULSANTS dizziness Other reaction(s): Unknown Uncoded Allergy. Allergen: ALCOHOL COUGH MEDICINE Other reaction(s): Unknown Numerous food allergies A lot of preservatives and medicine ingredients   Pineapple Other (See Comments)   Congestion and sneezing   Plum Pulp Other (See Comments)   Congestion and sneezing   Raspberry Other (See Comments)   Congestion and sneezing   Red Dye Other (See  Comments)   #40 Congestion and sneezing   Salicylic Acid    Other reaction(s): Other (See Comments) Many issues   Scallops [shellfish Allergy] Other (See Comments)   Congestion and sneezing   Tea Other (See Comments)   Congestion and sneezing   Vanilla Other (See Comments)   Congestion and sneezing   Whey Other (See Comments)   Congestion and sneezing      Medication List        Accurate as of 08/16/17  9:31 AM. Always use your most recent med list.          clonazePAM 0.5 MG tablet Commonly known as:  KLONOPIN Take 1 tablet (0.5 mg total) 2 (two) times daily by mouth.   cloNIDine 0.2 MG tablet Commonly known as:  CATAPRES Take 0.2 tablets by mouth 2 (two) times daily.   finasteride 5 MG tablet Commonly known as:  PROSCAR Take 1 tablet (5 mg total) by mouth daily.   furosemide 20 MG tablet Commonly known as:  LASIX   lisinopril 40 MG tablet Commonly known as:  PRINIVIL,ZESTRIL Take 40 mg by mouth daily.   NETI POT SINUS [place] NA Place 1 Applicatorful into the nose daily.   phenytoin 300 MG ER capsule Commonly known as:  DILANTIN Take 1 capsule (300 mg total) by mouth at bedtime.       Allergies:  Allergies  Allergen Reactions  . Almond Oil Shortness Of Breath  . Corn Oil Shortness Of Breath  . Dextroamphetamine Shortness Of Breath    dizziness  . Dust Mite Mixed Allergen Ext [Mite (D. Farinae)] Shortness Of Breath  . Salicylates Shortness Of Breath and Other (See Comments)    Other reaction(s): Other (See Comments), Syncope  . Amlodipine     Other reaction(s): Other (See Comments) Other Reaction: PATIENT STATES HE CANNOT TOLER dizziness  . Amphetamines     dizziness  . Aspirin     Other reaction(s): Unknown dizziness  . Beet [Beta Vulgaris] Other (See Comments)    Congestion and sneezing  . Cantaloupe (Diagnostic) Other (See Comments)    Congestion and sneezing  . Carrot [Daucus Carota] Other (See Comments)    Congestion and sneezing  .  Cheese     American cheddar  . Corn-Containing Products Other (See Comments)    Congestion and sneezing  . Felodipine     Other reaction(s): Other (See Comments) Other Reaction: PATIENT STATES HE CANNOT TOLER dizziness  . Fructose Other (See Comments)    Congestion and sneezing  . Green Dyes Other (See Comments)    #3 Congestion and sneezing  . Indapamide     Other reaction(s): Other (See Comments) Other Reaction: PATIENT STATES HE CANNOT TOLER dizziness  . Iodinated Diagnostic Agents Other (See Comments)    Other reaction(s): Unknown Uncoded Allergy. Allergen: Preservatives, Other Reaction: HTN CRISIS Uncoded Allergy. Allergen: ANTICONVULSANTS Uncoded Allergy. Allergen: Preservatives, Other Reaction: HTN CRISIS Uncoded Allergy. Allergen: ANTICONVULSANTS  . Lambs Quarters     lamb  . Milk-Related Compounds Other (See Comments)    Congestion and sneezing  . Mustard [  Allyl Isothiocyanate] Other (See Comments)    Congestion and sneezing  . Other Other (See Comments)    Other reaction(s): Unknown Uncoded Allergy. Allergen: ANTICONVULSANTS dizziness Other reaction(s): Unknown Uncoded Allergy. Allergen: ALCOHOL COUGH MEDICINE Other reaction(s): Unknown Numerous food allergies A lot of preservatives and medicine ingredients  . Pineapple Other (See Comments)    Congestion and sneezing  . Plum Pulp Other (See Comments)    Congestion and sneezing  . Raspberry Other (See Comments)    Congestion and sneezing  . Red Dye Other (See Comments)    #40 Congestion and sneezing  . Salicylic Acid     Other reaction(s): Other (See Comments) Many issues  . Scallops [Shellfish Allergy] Other (See Comments)    Congestion and sneezing  . Tea Other (See Comments)    Congestion and sneezing  . Vanilla Other (See Comments)    Congestion and sneezing  . Whey Other (See Comments)    Congestion and sneezing    Family History: Family History  Problem Relation Age of Onset  . Dementia  Mother   . Heart disease Father   . Parkinson's disease Sister   . Diabetes Sister   . Pancreatic cancer Sister   . Leukemia Sister     Social History:  reports that  has never smoked. he has never used smokeless tobacco. He reports that he does not drink alcohol or use drugs.  ROS: UROLOGY Frequent Urination?: No Hard to postpone urination?: No Burning/pain with urination?: No Get up at night to urinate?: Yes Leakage of urine?: No Urine stream starts and stops?: Yes Trouble starting stream?: No Do you have to strain to urinate?: No Blood in urine?: No Urinary tract infection?: No Sexually transmitted disease?: No Injury to kidneys or bladder?: No Painful intercourse?: No Weak stream?: No Erection problems?: No Penile pain?: No  Gastrointestinal Nausea?: No Vomiting?: No Indigestion/heartburn?: No Diarrhea?: No Constipation?: No  Constitutional Fever: No Night sweats?: No Weight loss?: No Fatigue?: No  Skin Skin rash/lesions?: No Itching?: No  Eyes Blurred vision?: No Double vision?: No  Ears/Nose/Throat Sore throat?: No Sinus problems?: Yes  Hematologic/Lymphatic Swollen glands?: No Easy bruising?: No  Cardiovascular Leg swelling?: No Chest pain?: No  Respiratory Cough?: No Shortness of breath?: No  Endocrine Excessive thirst?: No  Musculoskeletal Back pain?: No Joint pain?: No  Neurological Headaches?: No Dizziness?: No  Psychologic Depression?: No Anxiety?: No  Physical Exam: BP 108/63   Pulse (!) 56   Ht 5\' 9"  (1.753 m)   Wt 180 lb (81.6 kg)   BMI 26.58 kg/m   Constitutional:  Alert and oriented, No acute distress.  Accompanied by wife today. HEENT: [place] AT, moist mucus membranes.  Trachea midline, no masses. Cardiovascular: No clubbing, cyanosis, or edema. Respiratory: Normal respiratory effort, no increased work of breathing. GI: Abdomen is soft, nontender, nondistended, no abdominal masses Rectal: Normal sphincter tone.   Enlarged prostate, nontender, no nodules. Skin: No rashes, bruises or suspicious lesions. Neurologic: Grossly intact, no focal deficits, moving all 4 extremities. Psychiatric: Flat affect.  Laboratory Data: Lab Results  Component Value Date   WBC 8.2 06/23/2017   HGB 15.7 06/23/2017   HCT 47.8 06/23/2017   MCV 82.3 06/23/2017   PLT 179 06/23/2017    Lab Results  Component Value Date   CREATININE 1.11 06/23/2017    Lab Results  Component Value Date   HGBA1C 5.2 03/16/2016    Urinalysis N/a  Pertinent Imaging: Results for orders placed or performed in visit  on 08/16/17  BLADDER SCAN AMB NON-IMAGING  Result Value Ref Range   Scan Result 69ml     Assessment & Plan:    1. BPH with obstruction/lower urinary tract symptoms Long-standing acute on chronic urinary issues Large prostate  on exam with both obstructive and irritative voiding symptoms but improving Continue finasteride daily Prostate cancer screening updated today - Urinalysis, Complete - BLADDER SCAN AMB NON-IMAGING  2. Incomplete bladder emptying Previusly elevaed postvoid in the 100-250 cc range  PVR improved today Continue to follow Cr stable  3. Urinary frequency Not ideal candidate for anticholinergic medication given incomplete bladder emtpying   4. Microscopic hematuria S/p work up 2018   Follow-up in 1 year unless symptoms worsen for IPSS/PVR/DRE/PSA  Hollice Espy, MD  [facility] [address], [place], [place] [address] [phone]

## 2017-08-29 ENCOUNTER — Telehealth: Payer: Self-pay | Admitting: Urology

## 2017-08-29 DIAGNOSIS — R3129 Other microscopic hematuria: Secondary | ICD-10-CM

## 2017-08-29 NOTE — Telephone Encounter (Signed)
Patient's wife called and said that he needed a refill on his finasteride. He was seen on 08-16-17 and was not given any.   Sharyn Lull

## 2017-09-04 MED ORDER — FINASTERIDE 5 MG PO TABS: 5.0000 mg | ORAL_TABLET | Freq: Every day | ORAL | 11 refills | Status: DC

## 2017-09-04 NOTE — Addendum Note (Signed)
Addended by: Tommy Rainwater on: 09/04/2017 02:51 PM   Modules accepted: Orders

## 2018-05-19 ENCOUNTER — Encounter (HOSPITAL_COMMUNITY): Payer: Self-pay

## 2018-05-19 ENCOUNTER — Emergency Department (HOSPITAL_COMMUNITY)
Admission: EM | Admit: 2018-05-19 | Discharge: 2018-05-19 | Disposition: A | Discharge status: Home / Self Care | Payer: Medicare Other | Attending: Emergency Medicine | Admitting: Emergency Medicine

## 2018-05-19 ENCOUNTER — Other Ambulatory Visit: Payer: Self-pay

## 2018-05-19 DIAGNOSIS — T2121XA Burn of second degree of chest wall, initial encounter: Secondary | ICD-10-CM | POA: Diagnosis not present

## 2018-05-19 DIAGNOSIS — T2220XA Burn of second degree of shoulder and upper limb, except wrist and hand, unspecified site, initial encounter: Secondary | ICD-10-CM

## 2018-05-19 DIAGNOSIS — I129 Hypertensive chronic kidney disease with stage 1 through stage 4 chronic kidney disease, or unspecified chronic kidney disease: Secondary | ICD-10-CM | POA: Diagnosis not present

## 2018-05-19 DIAGNOSIS — Z79899 Other long term (current) drug therapy: Secondary | ICD-10-CM | POA: Insufficient documentation

## 2018-05-19 DIAGNOSIS — Z23 Encounter for immunization: Secondary | ICD-10-CM | POA: Diagnosis not present

## 2018-05-19 DIAGNOSIS — F419 Anxiety disorder, unspecified: Secondary | ICD-10-CM | POA: Diagnosis not present

## 2018-05-19 DIAGNOSIS — Y92002 Bathroom of unspecified non-institutional (private) residence single-family (private) house as the place of occurrence of the external cause: Secondary | ICD-10-CM | POA: Insufficient documentation

## 2018-05-19 DIAGNOSIS — Y93E1 Activity, personal bathing and showering: Secondary | ICD-10-CM | POA: Diagnosis not present

## 2018-05-19 DIAGNOSIS — Y998 Other external cause status: Secondary | ICD-10-CM | POA: Diagnosis not present

## 2018-05-19 DIAGNOSIS — X110XXA Contact with hot water in bath or tub, initial encounter: Secondary | ICD-10-CM | POA: Insufficient documentation

## 2018-05-19 DIAGNOSIS — T22231A Burn of second degree of right upper arm, initial encounter: Secondary | ICD-10-CM | POA: Insufficient documentation

## 2018-05-19 DIAGNOSIS — N183 Chronic kidney disease, stage 3 (moderate): Secondary | ICD-10-CM | POA: Insufficient documentation

## 2018-05-19 MED ORDER — HYDROCODONE-ACETAMINOPHEN 5-325 MG PO TABS: 1.0000 | ORAL_TABLET | Freq: Four times a day (QID) | ORAL | 0 refills | Status: DC | PRN

## 2018-05-19 MED ORDER — SILVER SULFADIAZINE 1 % EX CREA: 1.0000 "application " | TOPICAL_CREAM | Freq: Every day | CUTANEOUS | 2 refills | Status: DC

## 2018-05-19 MED ORDER — FENTANYL CITRATE (PF) 100 MCG/2ML IJ SOLN
50.0000 ug | Freq: Once | INTRAMUSCULAR | Status: AC | PRN
  Administered 2018-05-19: 50 ug via INTRAVENOUS
  Filled 2018-05-19: qty 2

## 2018-05-19 MED ORDER — SODIUM CHLORIDE 0.9 % IV BOLUS
1000.0000 mL | Freq: Once | INTRAVENOUS | Status: AC
  Administered 2018-05-19: 1000 mL via INTRAVENOUS

## 2018-05-19 MED ORDER — ONDANSETRON HCL 4 MG/2ML IJ SOLN
4.0000 mg | Freq: Once | INTRAMUSCULAR | Status: AC | PRN
  Administered 2018-05-19: 4 mg via INTRAVENOUS
  Filled 2018-05-19: qty 2

## 2018-05-19 MED ORDER — TETANUS-DIPHTH-ACELL PERTUSSIS 5-2.5-18.5 LF-MCG/0.5 IM SUSP
0.5000 mL | Freq: Once | INTRAMUSCULAR | Status: AC
  Administered 2018-05-19: 0.5 mL via INTRAMUSCULAR
  Filled 2018-05-19: qty 0.5

## 2018-05-19 MED ORDER — SILVER SULFADIAZINE 1 % EX CREA
TOPICAL_CREAM | Freq: Once | CUTANEOUS | Status: AC
  Administered 2018-05-19: 22:00:00 via TOPICAL
  Filled 2018-05-19: qty 50
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
## 2018-05-19 NOTE — ED Notes (Signed)
Bed: IW58 Expected date: 05/20/18 Expected time: 7:00 AM Means of arrival:  Comments:
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
## 2018-08-15 ENCOUNTER — Ambulatory Visit: Payer: Medicare Other | Admitting: Urology

## 2018-08-15 ENCOUNTER — Encounter: Payer: Self-pay | Admitting: Urology

## 2018-08-15 NOTE — Progress Notes (Incomplete)
08/15/2018 8:22 AM   Noah Rose 02/09/1951 [id number]  Referring provider: Renford Dills, [place], [place] [address]  No chief complaint on file.   HPI: Noah Rose is a 68 yo M who returns today for a routne annual f/u for the evaluation and management of BPH/incomplete bladder emptying.   He continues on finasteride.  He previously tried Flomax but had an episode of hypotension which may or may not have been related to this medication as he started an additional seizure medication around the same time.  No family history of prostate cancer.  Most recent PSA on 08/14/2017 1.5.  He does have a history of microscopic hematuria status post workup in 2018.    He does have a personal history of nonobstructing left renal calculi.  CT urogram in 2018.  He had 11 mm left lower pole stone which was asymptomatic.  PMH: Past Medical History:  Diagnosis Date   Anxiety    Dizziness    High blood pressure    Hypertension    Multiple allergies    Seizures [facility]     Surgical History: Past Surgical History:  Procedure Laterality Date   HERNIA REPAIR  02/2016    Home Medications:  Allergies as of 08/15/2018      Reactions   Almond Oil Shortness Of Breath   Corn Oil Shortness Of Breath   Dextroamphetamine Shortness Of Breath   dizziness   Dust Mite Mixed Allergen Ext [mite (d. Farinae)] Shortness Of Breath   Salicylates Shortness Of Breath, Other (See Comments)   Other reaction(s): Other (See Comments), Syncope   Amlodipine    Other reaction(s): Other (See Comments) Other Reaction: PATIENT STATES HE CANNOT TOLER dizziness   Amphetamines    dizziness   Aspirin    Other reaction(s): Unknown dizziness   Beet [beta Vulgaris] Other (See Comments)   Congestion and sneezing   Cantaloupe (diagnostic) Other (See Comments)   Congestion and sneezing   Carrot [daucus Carota] Other (See Comments)   Congestion and sneezing   Cheese     American cheddar   Corn-containing Products Other (See Comments)   Congestion and sneezing   Felodipine    Other reaction(s): Other (See Comments) Other Reaction: PATIENT STATES HE CANNOT TOLER dizziness   Fructose Other (See Comments)   Congestion and sneezing   Green Dyes Other (See Comments)   #3 Congestion and sneezing   Indapamide    Other reaction(s): Other (See Comments) Other Reaction: PATIENT STATES HE CANNOT TOLER dizziness   Iodinated Diagnostic Agents Other (See Comments)   Other reaction(s): Unknown Uncoded Allergy. Allergen: Preservatives, Other Reaction: HTN CRISIS Uncoded Allergy. Allergen: ANTICONVULSANTS Uncoded Allergy. Allergen: Preservatives, Other Reaction: HTN CRISIS Uncoded Allergy. Allergen: ANTICONVULSANTS   Lambs Quarters    lamb   Milk-related Compounds Other (See Comments)   Congestion and sneezing   Mustard [allyl Isothiocyanate] Other (See Comments)   Congestion and sneezing   Other Other (See Comments)   Other reaction(s): Unknown Uncoded Allergy. Allergen: ANTICONVULSANTS dizziness Other reaction(s): Unknown Uncoded Allergy. Allergen: ALCOHOL COUGH MEDICINE Other reaction(s): Unknown Numerous food allergies A lot of preservatives and medicine ingredients   Pineapple Other (See Comments)   Congestion and sneezing   Plum Pulp Other (See Comments)   Congestion and sneezing   Raspberry Other (See Comments)   Congestion and sneezing   Red Dye Other (See Comments)   #40 Congestion and sneezing   Salicylic Acid    Other  reaction(s): Other (See Comments) Many issues   Scallops [shellfish Allergy] Other (See Comments)   Congestion and sneezing   Tea Other (See Comments)   Congestion and sneezing   Vanilla Other (See Comments)   Congestion and sneezing   Whey Other (See Comments)   Congestion and sneezing      Medication List       Accurate as of August 15, 2018  8:22 AM. Always use your most recent med list.         clonazePAM 0.5 MG tablet Commonly known as:  KLONOPIN Take 1 tablet (0.5 mg total) 2 (two) times daily by mouth.   cloNIDine 0.2 MG tablet Commonly known as:  CATAPRES Take 0.2 tablets by mouth 2 (two) times daily.   finasteride 5 MG tablet Commonly known as:  PROSCAR Take 1 tablet (5 mg total) by mouth daily.   furosemide 20 MG tablet Commonly known as:  LASIX   HYDROcodone-acetaminophen 5-325 MG tablet Commonly known as:  NORCO Take 1-2 tablets by mouth every 6 (six) hours as needed for moderate pain.   lisinopril 40 MG tablet Commonly known as:  PRINIVIL,ZESTRIL Take 40 mg by mouth daily.   NETI POT SINUS [place] NA Place 1 Applicatorful into the nose daily.   phenytoin 300 MG ER capsule Commonly known as:  DILANTIN Take 1 capsule (300 mg total) by mouth at bedtime.   silver sulfADIAZINE 1 % cream Commonly known as:  SILVADENE Apply 1 application topically daily.       Allergies:  Allergies  Allergen Reactions   Almond Oil Shortness Of Breath   Corn Oil Shortness Of Breath   Dextroamphetamine Shortness Of Breath    dizziness   Dust Mite Mixed Allergen Ext [Mite (D. Farinae)] Shortness Of Breath   Salicylates Shortness Of Breath and Other (See Comments)    Other reaction(s): Other (See Comments), Syncope   Amlodipine     Other reaction(s): Other (See Comments) Other Reaction: PATIENT STATES HE CANNOT TOLER dizziness   Amphetamines     dizziness   Aspirin     Other reaction(s): Unknown dizziness   Beet [Beta Vulgaris] Other (See Comments)    Congestion and sneezing   Cantaloupe (Diagnostic) Other (See Comments)    Congestion and sneezing   Carrot [Daucus Carota] Other (See Comments)    Congestion and sneezing   Cheese     American cheddar   Corn-Containing Products Other (See Comments)    Congestion and sneezing   Felodipine     Other reaction(s): Other (See Comments) Other Reaction: PATIENT STATES HE CANNOT TOLER dizziness    Fructose Other (See Comments)    Congestion and sneezing   Green Dyes Other (See Comments)    #3 Congestion and sneezing   Indapamide     Other reaction(s): Other (See Comments) Other Reaction: PATIENT STATES HE CANNOT TOLER dizziness   Iodinated Diagnostic Agents Other (See Comments)    Other reaction(s): Unknown Uncoded Allergy. Allergen: Preservatives, Other Reaction: HTN CRISIS Uncoded Allergy. Allergen: ANTICONVULSANTS Uncoded Allergy. Allergen: Preservatives, Other Reaction: HTN CRISIS Uncoded Allergy. Allergen: ANTICONVULSANTS   Lambs Quarters     lamb   Milk-Related Compounds Other (See Comments)    Congestion and sneezing   Mustard [Allyl Isothiocyanate] Other (See Comments)    Congestion and sneezing   Other Other (See Comments)    Other reaction(s): Unknown Uncoded Allergy. Allergen: ANTICONVULSANTS dizziness Other reaction(s): Unknown Uncoded Allergy. Allergen: ALCOHOL COUGH MEDICINE Other reaction(s): Unknown Numerous food allergies A  lot of preservatives and medicine ingredients   Pineapple Other (See Comments)    Congestion and sneezing   Plum Pulp Other (See Comments)    Congestion and sneezing   Raspberry Other (See Comments)    Congestion and sneezing   Red Dye Other (See Comments)    #40 Congestion and sneezing   Salicylic Acid     Other reaction(s): Other (See Comments) Many issues   Scallops [Shellfish Allergy] Other (See Comments)    Congestion and sneezing   Tea Other (See Comments)    Congestion and sneezing   Vanilla Other (See Comments)    Congestion and sneezing   Whey Other (See Comments)    Congestion and sneezing    Family History: Family History  Problem Relation Age of Onset   Dementia Mother    Heart disease Father    Parkinson's disease Sister    Diabetes Sister    Pancreatic cancer Sister    Leukemia Sister     Social History:  reports that he has never smoked. He has never used smokeless tobacco.  He reports that he does not drink alcohol or use drugs.  ROS:                                        Physical Exam: There were no vitals taken for this visit.  Constitutional:  Alert and oriented, No acute distress. HEENT: [place] AT, moist mucus membranes.  Trachea midline, no masses. Cardiovascular: No clubbing, cyanosis, or edema. Respiratory: Normal respiratory effort, no increased work of breathing. GI: Abdomen is soft, nontender, nondistended, no abdominal masses GU: No CVA tenderness Lymph: No cervical or inguinal lymphadenopathy. Skin: No rashes, bruises or suspicious lesions. Neurologic: Grossly intact, no focal deficits, moving all 4 extremities. Psychiatric: Normal mood and affect.  Laboratory Data: Lab Results  Component Value Date   WBC 8.2 06/23/2017   HGB 15.7 06/23/2017   HCT 47.8 06/23/2017   MCV 82.3 06/23/2017   PLT 179 06/23/2017    Lab Results  Component Value Date   CREATININE 1.11 06/23/2017    No results found for: PSA  No results found for: TESTOSTERONE  Lab Results  Component Value Date   HGBA1C 5.2 03/16/2016    Urinalysis    Component Value Date/Time   COLORURINE YELLOW 04/23/2016 1921   APPEARANCEUR Clear 07/26/2016 1031   LABSPEC 1.012 04/23/2016 1921   PHURINE 8.0 04/23/2016 1921   GLUCOSEU Negative 07/26/2016 1031   HGBUR MODERATE (A) 04/23/2016 1921   BILIRUBINUR Negative 07/26/2016 1031   KETONESUR NEGATIVE 04/23/2016 1921   PROTEINUR 1+ (A) 07/26/2016 1031   PROTEINUR NEGATIVE 04/23/2016 1921   UROBILINOGEN 0.2 01/01/2015 0415   NITRITE Negative 07/26/2016 1031   NITRITE NEGATIVE 04/23/2016 1921   LEUKOCYTESUR Negative 07/26/2016 1031    Lab Results  Component Value Date   LABMICR See below: 07/26/2016   WBCUA 0-5 07/26/2016   RBCUA 0-2 07/26/2016   LABEPIT None seen 07/26/2016   BACTERIA None seen 07/26/2016    Pertinent Imaging: *** No results found for this or any previous visit. No  results found for this or any previous visit. No results found for this or any previous visit. No results found for this or any previous visit. No results found for this or any previous visit. No results found for this or any previous visit. Results for orders placed during the hospital  encounter of 07/19/16  CT HEMATURIA WORKUP   Narrative CLINICAL DATA:  Microscopic hematuria.  EXAM: CT ABDOMEN AND PELVIS WITHOUT AND WITH CONTRAST  TECHNIQUE: Multidetector CT imaging of the abdomen and pelvis was performed following the standard protocol before and following the bolus administration of intravenous contrast.  CONTRAST:  118mL ISOVUE-300 IOPAMIDOL (ISOVUE-300) INJECTION 61%  COMPARISON:  None.  FINDINGS: Lower chest: Calcified granuloma noted right middle lobe. 1-2 mm right lower lobe pulmonary nodule is seen on image 17 series 5. Calcified granuloma also identified left lower lobe.  Hepatobiliary: No focal abnormality within the liver parenchyma. There is no evidence for gallstones, gallbladder wall thickening, or pericholecystic fluid. No intrahepatic or extrahepatic biliary dilation.  Pancreas: No focal mass lesion. No dilatation of the main duct. No intraparenchymal cyst. No peripancreatic edema.  Spleen: Calcified granulomata.  Adrenals/Urinary Tract: No adrenal nodule or mass.  Pre contrast imaging shows no stones in the right kidney or ureter. 7 x 11 x 10 mm nonobstructing stone is identified in the lower pole of the left kidney. No left ureteral stones. No bladder stones.  No evidence for an enhancing lesion in either kidney after IV contrast administration. 4.7 cm simple cyst identified posterior left kidney. Scattered tiny low-density cortical lesions in each kidney are too small to characterize but likely represent cysts.  Delayed imaging shows bilateral hydronephrosis with no wall thickening or filling defect in either intrarenal collecting system or renal  pelvis. No focal hydroureter seen on either side. No mass lesions seen along the course of either ureter. No focal bladder wall abnormality is evident although assessment of posterior bladder wall limited by adjacent non-opacified urine.  Stomach/Bowel: Stomach is nondistended. No gastric wall thickening. No evidence of outlet obstruction. Duodenum is normally positioned as is the ligament of Treitz. No small bowel wall thickening. No small bowel dilatation. The terminal ileum is normal. The appendix is normal. No gross colonic mass. No colonic wall thickening. No substantial diverticular change.  Vascular/Lymphatic: There is abdominal aortic atherosclerosis without aneurysm. There is no gastrohepatic or hepatoduodenal ligament lymphadenopathy. No intraperitoneal or retroperitoneal lymphadenopathy. No pelvic sidewall lymphadenopathy.  Reproductive: Prostate gland is enlarged.  Other: No intraperitoneal free fluid.  Musculoskeletal: Bone windows reveal no worrisome lytic or sclerotic osseous lesions.  IMPRESSION: 1. Mild bilateral hydronephrosis without hydroureter. Component of bilateral UPJ obstruction would be a consideration. 2. Nonobstructing 11 mm stone lower pole left kidney. 3. Bilateral renal cysts. 4. Prostatomegaly. 5. Calcified granulomata in the lungs. 1-2 mm right lower lobe pulmonary nodule is noncalcified. No follow-up needed if patient is low-risk. Non-contrast chest CT can be considered in 12 months if patient is high-risk. This recommendation follows the consensus statement: Guidelines for Management of Incidental Pulmonary Nodules Detected on CT Images: From the Fleischner Society 2017; Radiology 2017; 284:228-243. 6.  Abdominal Aortic Atherosclerois (ICD10-170.0)   Electronically Signed   By: Misty Stanley M.D.   On: 07/19/2016 17:16    No results found for this or any previous visit.  Assessment & Plan:    1. BPH with obstruction/lower urinary  tract symptoms Long-standing acute on chronic urinary issues Large prostate  on exam with both obstructive and irritative voiding symptoms but improving Continue finasteride daily Prostate cancer screening updated today - Urinalysis, Complete - BLADDER SCAN AMB NON-IMAGING  2. Incomplete bladder emptying Previusly elevaed postvoid in the 100-250 cc range  PVR improved today Continue to follow Cr stable  3. Urinary frequency Not ideal candidate for anticholinergic medication given incomplete  bladder emtpying   4. Microscopic hematuria S/p work up 2018   No follow-ups on file.  [facility] [address] [place], [place] [address] [phone]  I, Lucas Mallow, am acting as a scribe for Dr. Hollice Espy,  {Add Scribe Attestation Statement}

## 2018-09-21 ENCOUNTER — Other Ambulatory Visit: Payer: Self-pay

## 2018-09-21 ENCOUNTER — Emergency Department (HOSPITAL_COMMUNITY)
Admission: EM | Admit: 2018-09-21 | Discharge: 2018-09-21 | Disposition: A | Discharge status: Home / Self Care | Payer: Medicare Other | Attending: Emergency Medicine | Admitting: Emergency Medicine

## 2018-09-21 ENCOUNTER — Encounter (HOSPITAL_COMMUNITY): Payer: Self-pay

## 2018-09-21 ENCOUNTER — Emergency Department (HOSPITAL_COMMUNITY): Payer: Medicare Other

## 2018-09-21 DIAGNOSIS — S0101XA Laceration without foreign body of scalp, initial encounter: Secondary | ICD-10-CM

## 2018-09-21 DIAGNOSIS — Z79899 Other long term (current) drug therapy: Secondary | ICD-10-CM | POA: Diagnosis not present

## 2018-09-21 DIAGNOSIS — W19XXXA Unspecified fall, initial encounter: Secondary | ICD-10-CM | POA: Insufficient documentation

## 2018-09-21 DIAGNOSIS — S0990XA Unspecified injury of head, initial encounter: Secondary | ICD-10-CM | POA: Diagnosis present

## 2018-09-21 DIAGNOSIS — N183 Chronic kidney disease, stage 3 (moderate): Secondary | ICD-10-CM | POA: Diagnosis not present

## 2018-09-21 DIAGNOSIS — Y939 Activity, unspecified: Secondary | ICD-10-CM | POA: Diagnosis not present

## 2018-09-21 DIAGNOSIS — Y998 Other external cause status: Secondary | ICD-10-CM | POA: Insufficient documentation

## 2018-09-21 DIAGNOSIS — I129 Hypertensive chronic kidney disease with stage 1 through stage 4 chronic kidney disease, or unspecified chronic kidney disease: Secondary | ICD-10-CM | POA: Insufficient documentation

## 2018-09-21 DIAGNOSIS — Y929 Unspecified place or not applicable: Secondary | ICD-10-CM | POA: Insufficient documentation

## 2018-09-21 LAB — CBC
HCT: 46.9 % (ref 39.0–52.0)
HEMOGLOBIN: 15.1 g/dL (ref 13.0–17.0)
MCH: 26.5 pg (ref 26.0–34.0)
MCHC: 32.2 g/dL (ref 30.0–36.0)
MCV: 82.3 fL (ref 80.0–100.0)
NRBC: 0 % (ref 0.0–0.2)
Platelets: 198 10*3/uL (ref 150–400)
RBC: 5.7 MIL/uL (ref 4.22–5.81)
RDW: 13.7 % (ref 11.5–15.5)
WBC: 8.8 10*3/uL (ref 4.0–10.5)

## 2018-09-21 LAB — BASIC METABOLIC PANEL
ANION GAP: 10 (ref 5–15)
BUN: 18 mg/dL (ref 8–23)
CO2: 24 mmol/L (ref 22–32)
Calcium: 8.9 mg/dL (ref 8.9–10.3)
Chloride: 101 mmol/L (ref 98–111)
Creatinine, Ser: 1.22 mg/dL (ref 0.61–1.24)
GFR calc Af Amer: >60 mL/min (ref >60)
GFR calc non Af Amer: >60 mL/min (ref >60)
GLUCOSE: 138 mg/dL — AB (ref 70–99)
POTASSIUM: 3.6 mmol/L (ref 3.5–5.1)
Sodium: 135 mmol/L (ref 135–145)

## 2018-09-21 LAB — URINALYSIS, ROUTINE W REFLEX MICROSCOPIC
Bilirubin Urine: NEGATIVE
GLUCOSE, UA: NEGATIVE mg/dL
HGB URINE DIPSTICK: NEGATIVE
Ketones, ur: NEGATIVE mg/dL
LEUKOCYTE UA: NEGATIVE
Nitrite: NEGATIVE
Protein, ur: NEGATIVE mg/dL
SPECIFIC GRAVITY, URINE: 1.008 (ref 1.005–1.030)
pH: 7 (ref 5.0–8.0)

## 2018-09-21 MED ORDER — SODIUM CHLORIDE 0.9% FLUSH: 3.0000 mL | Freq: Once | INTRAVENOUS | Status: DC

## 2018-09-21 MED ORDER — LIDOCAINE-EPINEPHRINE (PF) 2 %-1:200000 IJ SOLN
10.0000 mL | Freq: Once | INTRAMUSCULAR | Status: AC
  Administered 2018-09-21: 10 mL
  Filled 2018-09-21: qty 20

## 2018-09-21 NOTE — ED Provider Notes (Signed)
[facility] EMERGENCY DEPARTMENT Provider Note   CSN: [id number] Arrival date & time: 09/21/18  2056    History   Chief Complaint Chief Complaint  Patient presents with   Fall    HPI Noah Rose is a 68 y.o. male.     Pt presents to the ED today with fall and head injury.  Pt said he's had intermittent dizziness and feels like this is what caused him to fall.  Pt denies loc.  He is not on blood thinners.  Tetanus is UTD.     Past Medical History:  Diagnosis Date   Anxiety    Dizziness    High blood pressure    Hypertension    Multiple allergies    Seizures ([place])     Patient Active Problem List   Diagnosis Date Noted   Post-ictal state ([place]) 04/29/2017   CKD (chronic kidney disease), stage III ([place]) 01/11/2017   History of nonadherence to medical treatment 06/08/2016   BPH (benign prostatic hyperplasia) 04/24/2016   Delirium    Hyponatremia 04/23/2016   Microscopic hematuria 04/23/2016   Urinary retention: Probable 03/17/2016   Sinusitis, acute, maxillary: Right 03/16/2016   Convulsions ([place]) 03/16/2016   Facial fracture ([place]) 03/15/2016   Leukocytosis 03/15/2016   Hypertensive urgency    Orbital fracture    Seizure ([employer])    Spells 06/03/2015   Medication intolerance 04/03/2014   Hypoglycemia 11/26/2013   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   Nasal turbinate hypertrophy 09/25/2013   Nuclear sclerosis 05/08/2013   Nasal congestion 04/15/2013   Nocturia 04/02/2013   Increased frequency of urination 04/02/2013   Complex partial seizure ([place]) 11/05/2012   Alteration consciousness 09/05/2012   Syncope and collapse 07/28/2012   Hypertension 07/28/2012   Obstructive sleep apnea 07/28/2012   Allergic rhinitis 07/28/2012   Chronic anxiety 07/28/2012   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 Authorizing Provider  clonazePAM (KLONOPIN) 0.5 MG tablet Take 1 tablet (0.5 mg total) 2 (two) times daily by mouth. 04/30/17   Doreatha Lew, MD  cloNIDine (CATAPRES) 0.2 MG tablet Take 0.2 tablets by mouth 2 (two) times daily. 03/01/17   [provider]  finasteride (PROSCAR) 5 MG tablet Take 1 tablet (5 mg total) by mouth daily. 09/04/17   Hollice Espy, MD  furosemide (LASIX) 20 MG tablet  08/03/17   [provider]  HYDROcodone-acetaminophen (NORCO) 5-325 MG tablet Take 1-2 tablets by mouth every 6 (six) hours as needed for moderate pain. 05/19/18   Harris, Vernie Shanks, PA-C  lisinopril (PRINIVIL,ZESTRIL) 40 MG tablet Take 40 mg by mouth daily. 08/02/17   [provider]  phenytoin (DILANTIN) 300 MG ER capsule Take 1 capsule (300 mg total) by mouth at bedtime. 01/13/17   Hosie Poisson, MD  silver sulfADIAZINE (SILVADENE) 1 % cream Apply 1 application topically daily. 05/19/18   Harris, Vernie Shanks, PA-C  Sodium Chloride-Sodium Bicarb (NETI POT SINUS [place] NA) Place 1 Applicatorful into the nose daily.    [provider]    Family History Family History  Problem Relation Age of Onset  Dementia Mother    Heart disease Father    Parkinson's disease Sister    Diabetes Sister    Pancreatic cancer Sister    Leukemia Sister     Social History Social History   Tobacco Use   Smoking status: Never Smoker   Smokeless tobacco: Never Used  Substance Use Topics   Alcohol use: No   Drug use: No     Allergies   Almond oil; Corn oil; Dextroamphetamine; Dust mite mixed allergen ext [mite (d. farinae)]; Salicylates; Amlodipine; Amphetamines; Aspirin; Beet [beta  vulgaris]; Cantaloupe (diagnostic); Carrot [daucus carota]; Cheese; Corn-containing products; Felodipine; Fructose; Green dyes; Indapamide; Iodinated diagnostic agents; Lambs quarters; Milk-related compounds; Mustard [allyl isothiocyanate]; Other; Pineapple; Plum pulp; Raspberry; Red dye; Salicylic acid; Scallops [shellfish allergy]; Tea; Vanilla; and Whey   Review of Systems Review of Systems  Skin: Positive for wound.  All other systems reviewed and are negative.    Physical Exam Updated Vital Signs BP (!) 183/93 (BP Location: Left Arm)    Pulse (!) 38    Temp 99 F (37.2 C) (Oral)    Resp 16    SpO2 96%   Physical Exam Vitals signs and nursing note reviewed.  Constitutional:      Appearance: Normal appearance.  HENT:     Head:     Comments: Lac to top of head    Right Ear: External ear normal.     Left Ear: External ear normal.     Nose: Nose normal.     Mouth/Throat:     Mouth: Mucous membranes are dry.  Eyes:     Extraocular Movements: Extraocular movements intact.     Pupils: Pupils are equal, round, and reactive to light.  Neck:     Musculoskeletal: Normal range of motion and neck supple.  Cardiovascular:     Rate and Rhythm: Normal rate and regular rhythm.     Pulses: Normal pulses.     Heart sounds: Normal heart sounds.  Pulmonary:     Effort: Pulmonary effort is normal.     Breath sounds: Normal breath sounds.  Abdominal:     General: Abdomen is flat. Bowel sounds are normal.     Palpations: Abdomen is soft.  Musculoskeletal: Normal range of motion.  Skin:    General: Skin is warm.     Capillary Refill: Capillary refill takes less than 2 seconds.  Neurological:     General: No focal deficit present.     Mental Status: He is alert and oriented to person, place, and time.  Psychiatric:        Mood and Affect: Mood normal.        Behavior: Behavior normal.      ED Treatments / Results  Labs (all labs ordered are listed, but only abnormal results are  displayed) Labs Reviewed  BASIC METABOLIC PANEL - Abnormal; Notable for the following components:      Result Value   Glucose, Bld 138 (*)    All other components within normal limits  URINALYSIS, ROUTINE W REFLEX MICROSCOPIC - Abnormal; Notable for the following components:   Color, Urine STRAW (*)    All other components within normal limits  CBC  CBG MONITORING, ED    EKG EKG Interpretation  Date/Time:  Friday September 21 2018 21:10:22 EDT Ventricular Rate:  106 PR Interval:  160 QRS Duration: 94 QT Interval:  338 QTC Calculation: 448 R Axis:   50 Text Interpretation:  Sinus tachycardia with frequent Premature ventricular complexes  Moderate voltage criteria for LVH, may be normal variant Borderline ECG No significant change since last tracing Confirmed by Isla Pence [phone]) on 09/21/2018 10:14:54 PM   Radiology Ct Head Wo Contrast  Result Date: 09/21/2018 CLINICAL DATA:  Fall, dizziness EXAM: CT HEAD WITHOUT CONTRAST TECHNIQUE: Contiguous axial images were obtained from the base of the skull through the vertex without intravenous contrast. COMPARISON:  06/23/2017 FINDINGS: Brain: No evidence of acute infarction, hemorrhage, hydrocephalus, extra-axial collection or mass lesion/mass effect. Vascular: No hyperdense vessel or unexpected calcification. Skull: Normal. Negative for fracture or focal lesion. Sinuses/Orbits: No acute finding. Other: None. IMPRESSION: No acute intracranial pathology. Electronically Signed   By: Eddie Candle M.D.   On: 09/21/2018 22:46    Procedures .Marland KitchenLaceration Repair Date/Time: 09/21/2018 10:49 PM Performed by: Isla Pence, MD Authorized by: Isla Pence, MD   Consent:    Consent obtained:  Verbal   Consent given by:  Patient   Risks discussed:  Pain, need for additional repair and poor cosmetic result   Alternatives discussed:  No treatment Anesthesia (see MAR for exact dosages):    Anesthesia method:  Local infiltration   Local  anesthetic:  Lidocaine 2% WITH epi Laceration details:    Location:  Scalp   Scalp location:  R parietal   Length (cm):  5 Repair type:    Repair type:  Simple Pre-procedure details:    Preparation:  Patient was prepped and draped in usual sterile fashion Exploration:    Hemostasis achieved with:  Epinephrine   Contaminated: no   Treatment:    Area cleansed with:  Saline   Amount of cleaning:  Standard   Irrigation solution:  Sterile saline   Irrigation method:  Pressure wash Skin repair:    Repair method:  Staples   Number of staples:  8 Approximation:    Approximation:  Close Post-procedure details:    Dressing:  Antibiotic ointment and non-adherent dressing   Patient tolerance of procedure:  Tolerated well, no immediate complications   (including critical care time)  Medications Ordered in ED Medications  sodium chloride flush (NS) 0.9 % injection 3 mL (3 mLs Intravenous Not Given 09/21/18 2205)  lidocaine-EPINEPHrine (XYLOCAINE W/EPI) 2 %-1:200000 (PF) injection 10 mL (10 mLs Infiltration Given 09/21/18 2230)     Initial Impression / Assessment and Plan / ED Course  I have reviewed the triage vital signs and the nursing notes.  Pertinent labs & imaging results that were available during my care of the patient were reviewed by me and considered in my medical decision making (see chart for details).        Vitals in chart showed a HR of 38.  I think that is because he has lots of PVCs.  The pt's HR is 88 now.  Dizziness has been ongoing.  He's seen a doctor at [facility] for it.  He is instructed to return if worse.  F/u in 7-10 days for suture removal.  Final Clinical Impressions(s) / ED Diagnoses   Final diagnoses:  Laceration of scalp, initial encounter    ED Discharge Orders    None       Isla Pence, MD 09/21/18 2259

## 2018-09-21 NOTE — ED Notes (Signed)
Pt was taken to CT and then transport said they would bring him to the room

## 2018-09-21 NOTE — ED Triage Notes (Signed)
Pt here for a fall this evening after getting dizzy and short of breath.  Hx of HTN and seizures.  A&Ox4 at this time. Not on blood thinners.

## 2018-09-21 NOTE — Discharge Instructions (Addendum)
Staples need to be removed in 7 to 10 days.

## 2018-09-29 ENCOUNTER — Ambulatory Visit (HOSPITAL_COMMUNITY)
Admission: EM | Admit: 2018-09-29 | Discharge: 2018-09-29 | Disposition: A | Discharge status: Home / Self Care | Payer: Medicare Other | Attending: Family Medicine | Admitting: Family Medicine

## 2018-09-29 DIAGNOSIS — Z4802 Encounter for removal of sutures: Secondary | ICD-10-CM

## 2018-09-29 NOTE — ED Triage Notes (Addendum)
Pt here for staple removal to his right posterior, upper head.  Pt had staples placed 8 days ago.  The wound is clean, dry, and intact.  Wound is approximated with no redness or swelling.  Pt handled removal of the 8 staples well.  He was instructed to continue to keep the area clean.  He is to watch for redness, swelling, drainage, and fever and to return if he has any of these issues.  Pt stated understanding.

## 2018-10-08 ENCOUNTER — Other Ambulatory Visit: Payer: Self-pay | Admitting: Urology

## 2018-10-08 DIAGNOSIS — R3129 Other microscopic hematuria: Secondary | ICD-10-CM

## 2018-10-09 ENCOUNTER — Telehealth: Payer: Self-pay | Admitting: Urology

## 2018-10-09 DIAGNOSIS — R3129 Other microscopic hematuria: Secondary | ICD-10-CM

## 2018-10-09 MED ORDER — FINASTERIDE 5 MG PO TABS: 5.0000 mg | ORAL_TABLET | Freq: Every day | ORAL | 2 refills | Status: DC

## 2018-10-09 NOTE — Telephone Encounter (Signed)
I will call and make his apps for PSA and follow up   [facility]

## 2018-10-09 NOTE — Telephone Encounter (Signed)
In light of COVID 19 situation, he may have 3 months of refills but then needs to be seen prior to any further refills per our office policy.  I will send them to his pharmacy if you can notify him of this and schedule follow-up.  Hollice Espy, MD

## 2018-10-09 NOTE — Telephone Encounter (Signed)
Patient's wife called asking if he could have a refill in his finasteride? He missed his app in February and wants to know if he can get a refill or does he need to be seen prior to getting more med's? Looks like the refill request has been denied pending provider review?  Thanks [employer]

## 2018-12-05 ENCOUNTER — Encounter: Payer: Self-pay | Admitting: Urology

## 2018-12-05 ENCOUNTER — Ambulatory Visit
Admission: RE | Admit: 2018-12-05 | Discharge: 2018-12-05 | Disposition: A | Discharge status: Home / Self Care | Payer: Medicare Other | Source: Ambulatory Visit | Attending: Urology | Admitting: Urology

## 2018-12-05 ENCOUNTER — Other Ambulatory Visit
Admission: RE | Admit: 2018-12-05 | Discharge: 2018-12-05 | Disposition: A | Discharge status: Home / Self Care | Payer: Medicare Other | Source: Home / Self Care | Attending: Urology | Admitting: Urology

## 2018-12-05 ENCOUNTER — Other Ambulatory Visit: Payer: Self-pay

## 2018-12-05 ENCOUNTER — Ambulatory Visit (INDEPENDENT_AMBULATORY_CARE_PROVIDER_SITE_OTHER): Payer: Medicare Other | Admitting: Urology

## 2018-12-05 VITALS — BP 136/81 | HR 69 | Ht 69.0 in | Wt 200.0 lb

## 2018-12-05 DIAGNOSIS — N401 Enlarged prostate with lower urinary tract symptoms: Secondary | ICD-10-CM

## 2018-12-05 DIAGNOSIS — Z87448 Personal history of other diseases of urinary system: Secondary | ICD-10-CM

## 2018-12-05 DIAGNOSIS — Z125 Encounter for screening for malignant neoplasm of prostate: Secondary | ICD-10-CM

## 2018-12-05 DIAGNOSIS — N138 Other obstructive and reflux uropathy: Secondary | ICD-10-CM | POA: Insufficient documentation

## 2018-12-05 DIAGNOSIS — N400 Enlarged prostate without lower urinary tract symptoms: Secondary | ICD-10-CM

## 2018-12-05 DIAGNOSIS — N200 Calculus of kidney: Secondary | ICD-10-CM | POA: Diagnosis not present

## 2018-12-05 DIAGNOSIS — R339 Retention of urine, unspecified: Secondary | ICD-10-CM | POA: Diagnosis not present

## 2018-12-05 LAB — URINALYSIS, COMPLETE (UACMP) WITH MICROSCOPIC
Bacteria, UA: NONE SEEN
Bilirubin Urine: NEGATIVE
Glucose, UA: NEGATIVE mg/dL
Hgb urine dipstick: NEGATIVE
Ketones, ur: NEGATIVE mg/dL
Leukocytes,Ua: NEGATIVE
Nitrite: NEGATIVE
Protein, ur: NEGATIVE mg/dL
Specific Gravity, Urine: 1.01 (ref 1.005–1.030)
Squamous Epithelial / HPF: NONE SEEN (ref 0–5)
pH: 6 (ref 5.0–8.0)

## 2018-12-05 LAB — URINALYSIS, COMPLETE
Bilirubin, UA: NEGATIVE
Glucose, UA: NEGATIVE
Ketones, UA: NEGATIVE
Nitrite, UA: NEGATIVE
Protein,UA: NEGATIVE
RBC, UA: NEGATIVE
Specific Gravity, UA: 1.01 (ref 1.005–1.030)
Urobilinogen, Ur: 0.2 mg/dL (ref 0.2–1.0)
pH, UA: 5.5 (ref 5.0–7.5)

## 2018-12-05 LAB — PSA: Prostatic Specific Antigen: 0.81 ng/mL (ref 0.00–4.00)

## 2018-12-05 LAB — MICROSCOPIC EXAMINATION
Bacteria, UA: NONE SEEN
Epithelial Cells (non renal): NONE SEEN /hpf (ref 0–10)
RBC: NONE SEEN /hpf (ref 0–2)

## 2018-12-05 LAB — BLADDER SCAN AMB NON-IMAGING

## 2018-12-05 NOTE — Progress Notes (Signed)
12/05/2018 12:27 PM   Noah Rose 09-02-1950 [id number]  Referring provider: Renford Dills, [place], [place] [address]  Chief Complaint  Patient presents with  . Benign Prostatic Hypertrophy    1year    HPI: 68 year old male with a personal history of BPH/incomplete bladder emptying returns today for routine annual follow up.  He was seen in February 2019, follow-up delayed for several months secondary to COVID-19 outbreak.  He is currently managed on finasteride only.  He was unable to tolerate Flomax due to orthostatic hypotension..  Overall, his symptoms are fairly well controlled.  He feels like he is emptying his bladder for the most PVR is mildly elevated today, see below.   No personal history of UTI or bladder stones.  His creatinine is mildly elevated to 1.22 from his previous baseline of 1.1 on 08/2018.  He is due for PSA screening today.  No family history of prostate cancer.  His PSA has remained stably low.  His remote history of microscopic hematuria.  His UA today is unremarkable.  In addition to the above, he is a personal history of nonobstructing left renal calculus measuring 11 mm in the left lower pole based on CT scan 2019.  He is never previously had a stone episode.  He denies any flank pain or any other issues in the interim.   IPSS    Row Name 12/05/18 1200         International Prostate Symptom Score   How often have you had the sensation of not emptying your bladder?  Not at All     How often have you had to urinate less than every two hours?  Not at All     How often have you found you stopped and started again several times when you urinated?  Not at All     How often have you found it difficult to postpone urination?  Less than 1 in 5 times     How often have you had a weak urinary stream?  Not at All     How often have you had to strain to start urination?  Not at All     How many times did you typically get up at  night to urinate?  5 Times     Total IPSS Score  6       Quality of Life due to urinary symptoms   If you were to spend the rest of your life with your urinary condition just the way it is now how would you feel about that?  Mostly Satisfied        Score:  1-7 Mild 8-19 Moderate 20-35 Severe   PMH: Past Medical History:  Diagnosis Date  . Anxiety   . Dizziness   . High blood pressure   . Hypertension   . Multiple allergies   . Seizures [facility])     Surgical History: Past Surgical History:  Procedure Laterality Date  . HERNIA REPAIR  02/2016    Home Medications:  Allergies as of 12/05/2018      Reactions   Almond Oil Shortness Of Breath   Corn Oil Shortness Of Breath   Dextroamphetamine Shortness Of Breath   dizziness   Dust Mite Mixed Allergen Ext [mite (d. Farinae)] Shortness Of Breath   Salicylates Shortness Of Breath, Other (See Comments)   Other reaction(s): Other (See Comments), Syncope   Amlodipine    Other reaction(s): Other (See Comments) Other Reaction:  PATIENT STATES HE CANNOT TOLER dizziness   Amphetamines    dizziness   Aspirin    Other reaction(s): Unknown dizziness   Beet [beta Vulgaris] Other (See Comments)   Congestion and sneezing   Cantaloupe (diagnostic) Other (See Comments)   Congestion and sneezing   Carrot [daucus [place]ota] Other (See Comments)   Congestion and sneezing   Cheese    American cheddar   Corn-containing Products Other (See Comments)   Congestion and sneezing   Felodipine    Other reaction(s): Other (See Comments) Other Reaction: PATIENT STATES HE CANNOT TOLER dizziness   Fructose Other (See Comments)   Congestion and sneezing   Green Dyes Other (See Comments)   #3 Congestion and sneezing   Indapamide    Other reaction(s): Other (See Comments) Other Reaction: PATIENT STATES HE CANNOT TOLER dizziness   Iodinated Diagnostic Agents Other (See Comments)   Other reaction(s): Unknown Uncoded Allergy. Allergen:  Preservatives, Other Reaction: HTN CRISIS Uncoded Allergy. Allergen: ANTICONVULSANTS Uncoded Allergy. Allergen: Preservatives, Other Reaction: HTN CRISIS Uncoded Allergy. Allergen: ANTICONVULSANTS   Lambs Quarters    lamb   Milk-related Compounds Other (See Comments)   Congestion and sneezing   Mustard [allyl Isothiocyanate] Other (See Comments)   Congestion and sneezing   Other Other (See Comments)   Other reaction(s): Unknown Uncoded Allergy. Allergen: ANTICONVULSANTS dizziness Other reaction(s): Unknown Uncoded Allergy. Allergen: ALCOHOL COUGH MEDICINE Other reaction(s): Unknown Numerous food allergies A lot of preservatives and medicine ingredients   Pineapple Other (See Comments)   Congestion and sneezing   Plum Pulp Other (See Comments)   Congestion and sneezing   Raspberry Other (See Comments)   Congestion and sneezing   Red Dye Other (See Comments)   #40 Congestion and sneezing   Salicylic Acid    Other reaction(s): Other (See Comments) Many issues   Scallops [shellfish Allergy] Other (See Comments)   Congestion and sneezing   Tea Other (See Comments)   Congestion and sneezing   Vanilla Other (See Comments)   Congestion and sneezing   Whey Other (See Comments)   Congestion and sneezing      Medication List       Accurate as of December 05, 2018 12:27 PM. If you have any questions, ask your nurse or doctor.        STOP taking these medications   furosemide 20 MG tablet Commonly known as:  LASIX Stopped by:  Hollice Espy, MD   HYDROcodone-acetaminophen 5-325 MG tablet Commonly known as:  Norco Stopped by:  Hollice Espy, MD   phenytoin 300 MG ER capsule Commonly known as:  DILANTIN Stopped by:  Hollice Espy, MD   silver sulfADIAZINE 1 % cream Commonly known as:  SILVADENE Stopped by:  Hollice Espy, MD     TAKE these medications   clonazePAM 0.5 MG tablet Commonly known as:  KLONOPIN Take 1 tablet (0.5 mg total) 2 (two) times daily by  mouth.   cloNIDine 0.2 MG tablet Commonly known as:  CATAPRES Take 0.2 tablets by mouth 2 (two) times daily.   finasteride 5 MG tablet Commonly known as:  PROSCAR Take 1 tablet (5 mg total) by mouth daily.   lisinopril 40 MG tablet Commonly known as:  ZESTRIL Take 40 mg by mouth daily.   NETI POT SINUS [place] NA Place 1 Applicatorful into the nose daily.   torsemide 10 MG tablet Commonly known as:  DEMADEX Take 10 mg by mouth daily.   vitamin C 500 MG tablet Commonly known as:  ASCORBIC ACID Take 500 mg by mouth daily.       Allergies:  Allergies  Allergen Reactions  . Almond Oil Shortness Of Breath  . Corn Oil Shortness Of Breath  . Dextroamphetamine Shortness Of Breath    dizziness  . Dust Mite Mixed Allergen Ext [Mite (D. Farinae)] Shortness Of Breath  . Salicylates Shortness Of Breath and Other (See Comments)    Other reaction(s): Other (See Comments), Syncope  . Amlodipine     Other reaction(s): Other (See Comments) Other Reaction: PATIENT STATES HE CANNOT TOLER dizziness  . Amphetamines     dizziness  . Aspirin     Other reaction(s): Unknown dizziness  . Beet [Beta Vulgaris] Other (See Comments)    Congestion and sneezing  . Cantaloupe (Diagnostic) Other (See Comments)    Congestion and sneezing  . Carrot [Daucus Carota] Other (See Comments)    Congestion and sneezing  . Cheese     American cheddar  . Corn-Containing Products Other (See Comments)    Congestion and sneezing  . Felodipine     Other reaction(s): Other (See Comments) Other Reaction: PATIENT STATES HE CANNOT TOLER dizziness  . Fructose Other (See Comments)    Congestion and sneezing  . Green Dyes Other (See Comments)    #3 Congestion and sneezing  . Indapamide     Other reaction(s): Other (See Comments) Other Reaction: PATIENT STATES HE CANNOT TOLER dizziness  . Iodinated Diagnostic Agents Other (See Comments)    Other reaction(s): Unknown Uncoded Allergy. Allergen:  Preservatives, Other Reaction: HTN CRISIS Uncoded Allergy. Allergen: ANTICONVULSANTS Uncoded Allergy. Allergen: Preservatives, Other Reaction: HTN CRISIS Uncoded Allergy. Allergen: ANTICONVULSANTS  . Lambs Quarters     lamb  . Milk-Related Compounds Other (See Comments)    Congestion and sneezing  . Madelaine Bhat Isothiocyanate] Other (See Comments)    Congestion and sneezing  . Other Other (See Comments)    Other reaction(s): Unknown Uncoded Allergy. Allergen: ANTICONVULSANTS dizziness Other reaction(s): Unknown Uncoded Allergy. Allergen: ALCOHOL COUGH MEDICINE Other reaction(s): Unknown Numerous food allergies A lot of preservatives and medicine ingredients  . Pineapple Other (See Comments)    Congestion and sneezing  . Plum Pulp Other (See Comments)    Congestion and sneezing  . Raspberry Other (See Comments)    Congestion and sneezing  . Red Dye Other (See Comments)    #40 Congestion and sneezing  . Salicylic Acid     Other reaction(s): Other (See Comments) Many issues  . Scallops [Shellfish Allergy] Other (See Comments)    Congestion and sneezing  . Tea Other (See Comments)    Congestion and sneezing  . Vanilla Other (See Comments)    Congestion and sneezing  . Whey Other (See Comments)    Congestion and sneezing    Family History: Family History  Problem Relation Age of Onset  . Dementia Mother   . Heart disease Father   . Parkinson's disease Sister   . Diabetes Sister   . Pancreatic cancer Sister   . Leukemia Sister     Social History:  reports that he has never smoked. He has never used smokeless tobacco. He reports that he does not drink alcohol or use drugs.  ROS: UROLOGY Frequent Urination?: Yes Hard to postpone urination?: Yes Burning/pain with urination?: No Get up at night to urinate?: Yes Leakage of urine?: No Urine stream starts and stops?: No Trouble starting stream?: No Do you have to strain to urinate?: No Blood in urine?: No  Urinary tract  infection?: No Sexually transmitted disease?: No Injury to kidneys or bladder?: No Painful intercourse?: No Weak stream?: No Erection problems?: No Penile pain?: No  Gastrointestinal Nausea?: No Vomiting?: No Indigestion/heartburn?: No Diarrhea?: No Constipation?: No  Constitutional Fever: No Night sweats?: No Weight loss?: No Fatigue?: No  Skin Skin rash/lesions?: No Itching?: No  Eyes Blurred vision?: No Double vision?: No  Ears/Nose/Throat Sore throat?: No Sinus problems?: No  Hematologic/Lymphatic Swollen glands?: No Easy bruising?: No  Cardiovascular Leg swelling?: No Chest pain?: No  Respiratory Cough?: No Shortness of breath?: No  Endocrine Excessive thirst?: No  Musculoskeletal Back pain?: No Joint pain?: No  Neurological Headaches?: No Dizziness?: No  Psychologic Depression?: No Anxiety?: No  Physical Exam: BP 136/81   Pulse 69   Ht 5\' 9"  (1.753 m)   Wt 200 lb (90.7 kg)   BMI 29.53 kg/m   Constitutional:  Alert and oriented, No acute distress. HEENT: [place] AT, moist mucus membranes.  Trachea midline, no masses. Cardiovascular: No clubbing, cyanosis, or edema. Respiratory: Normal respiratory effort, no increased work of breathing. GI: Abdomen is soft, nontender, nondistended, no abdominal masses GU: No CVA tenderness Rectal exam: Normal sphincter tone.  Significant prostamegaly, 60 cc plus prostate, nontender no nodules. Skin: No rashes, bruises or suspicious lesions. Neurologic: Grossly intact, no focal deficits, moving all 4 extremities. Psychiatric: Normal mood and affect.  Laboratory Data: Lab Results  Component Value Date   WBC 8.8 09/21/2018   HGB 15.1 09/21/2018   HCT 46.9 09/21/2018   MCV 82.3 09/21/2018   PLT 198 09/21/2018    Lab Results  Component Value Date   CREATININE 1.22 09/21/2018    Lab Results  Component Value Date   HGBA1C 5.2 03/16/2016    Urinalysis Results for orders placed or  performed in visit on 12/05/18  Microscopic Examination  Result Value Ref Range   WBC, UA 0-5 0 - 5 /hpf   RBC None seen 0 - 2 /hpf   Epithelial Cells (non renal) None seen 0 - 10 /hpf   Bacteria, UA None seen None seen/Few  Urinalysis, Complete  Result Value Ref Range   Specific Gravity, UA 1.010 1.005 - 1.030   pH, UA 5.5 5.0 - 7.5   Color, UA Yellow Yellow   Appearance Ur Clear Clear   Leukocytes,UA Trace (A) Negative   Protein,UA Negative Negative/Trace   Glucose, UA Negative Negative   Ketones, UA Negative Negative   RBC, UA Negative Negative   Bilirubin, UA Negative Negative   Urobilinogen, Ur 0.2 0.2 - 1.0 mg/dL   Nitrite, UA Negative Negative   Microscopic Examination See below:   BLADDER SCAN AMB NON-IMAGING  Result Value Ref Range   Scan Result 177ml      Pertinent Imaging: Results for orders placed or performed in visit on 12/05/18  BLADDER SCAN AMB NON-IMAGING  Result Value Ref Range   Scan Result 159ml      Assessment & Plan:    1. BPH with urinary obstruction Symptoms well controlled on finasteride Continue this medication Unable to tolerate alpha-blocker in the past Not interested in surgical intervention - Urinalysis, Complete - BLADDER SCAN AMB NON-IMAGING - Abdomen 1 view (KUB); Future - PSA  2. Incomplete bladder emptying Mildly elevated PVR which is essentially stable No sequela from this We will continue to follow  3. Kidney stones Personal history of nonobstructing kidney stone Last imaging in 2017 Recommend KUB today to assess for interval growth He is agreeable this plan  4.  Screening PSA (prostate specific antigen) PSA today, rectal exam unremarkable We will call him with his results  5. History of hematuria UA today unremarkable Status post previous negative work-up 2018   Return for KUB today, f/u 1 year with PSA/ DRE/ IPSS/ PVR.  Hollice Espy, MD  [facility] [address], [place]  [place], [place] [address] [phone]

## 2018-12-06 ENCOUNTER — Telehealth: Payer: Self-pay

## 2018-12-06 NOTE — Telephone Encounter (Signed)
-----   Message from Hollice Espy, MD sent at 12/06/2018  8:17 AM EDT ----- PSA is excellent! 0.81.    Hollice Espy, MD

## 2018-12-06 NOTE — Telephone Encounter (Signed)
-----   Message from Hollice Espy, MD sent at 12/05/2018  8:27 PM EDT ----- Your kidney stone does not look to be enlarged.  Great news.  Notably on the x-ray, you have a lots of stool/feces within your intestines.  I strongly recommend an over-the-counter medication for constipation.  Hollice Espy, MD

## 2018-12-06 NOTE — Telephone Encounter (Signed)
Patient notified

## 2019-01-04 ENCOUNTER — Other Ambulatory Visit: Payer: Self-pay | Admitting: Urology

## 2019-01-04 DIAGNOSIS — R3129 Other microscopic hematuria: Secondary | ICD-10-CM

## 2019-12-09 ENCOUNTER — Other Ambulatory Visit: Payer: Self-pay

## 2019-12-09 DIAGNOSIS — N200 Calculus of kidney: Secondary | ICD-10-CM

## 2019-12-09 NOTE — Progress Notes (Incomplete)
12/12/19 11:38 AM   Noah Rose 05/25/1951 [id number]  Referring provider: Renford Dills, [place],  [place] [address] No chief complaint on file.   HPI: Noah Rose is a 69 y.o. M with a personal history of BPH/incomplete bladder emptying returns today for routine annual follow up for the evaluation and management of nephrolithiasis.   He is currently managed on finasteride only. He was unable to tolerate Flomax due to orthostatic hypotension..  Overall, his symptoms are fairly well controlled.  He feels like he is emptying his bladder for the most PVR is mildly elevated today, see below.   No personal history of UTI or bladder stones. No family history of prostate cancer.   In addition to the above, he is a personal history of nonobstructing left renal calculus measuring 11 mm in the left lower pole based on CT scan 2019.  He is never previously had a stone episode.  He denies any flank pain or any other issues in the interim.  PMH: Past Medical History:  Diagnosis Date  . Anxiety   . Dizziness   . High blood pressure   . Hypertension   . Multiple allergies   . Seizures [facility])     Surgical History: Past Surgical History:  Procedure Laterality Date  . HERNIA REPAIR  02/2016    Home Medications:  Allergies as of 12/10/2019      Reactions   Almond Oil Shortness Of Breath   Corn Oil Shortness Of Breath   Dextroamphetamine Shortness Of Breath   dizziness   Dust Mite Mixed Allergen Ext [mite (d. Farinae)] Shortness Of Breath   Salicylates Shortness Of Breath, Other (See Comments)   Other reaction(s): Other (See Comments), Syncope   Amlodipine    Other reaction(s): Other (See Comments) Other Reaction: PATIENT STATES HE CANNOT TOLER dizziness   Amphetamines    dizziness   Aspirin    Other reaction(s): Unknown dizziness   Beet [beta Vulgaris] Other (See Comments)   Congestion and sneezing   Cantaloupe (diagnostic) Other (See Comments)    Congestion and sneezing   Carrot [daucus Carota] Other (See Comments)   Congestion and sneezing   Cheese    American cheddar   Corn-containing Products Other (See Comments)   Congestion and sneezing   Felodipine    Other reaction(s): Other (See Comments) Other Reaction: PATIENT STATES HE CANNOT TOLER dizziness   Fructose Other (See Comments)   Congestion and sneezing   Green Dyes Other (See Comments)   #3 Congestion and sneezing   Indapamide    Other reaction(s): Other (See Comments) Other Reaction: PATIENT STATES HE CANNOT TOLER dizziness   Iodinated Diagnostic Agents Other (See Comments)   Other reaction(s): Unknown Uncoded Allergy. Allergen: Preservatives, Other Reaction: HTN CRISIS Uncoded Allergy. Allergen: ANTICONVULSANTS Uncoded Allergy. Allergen: Preservatives, Other Reaction: HTN CRISIS Uncoded Allergy. Allergen: ANTICONVULSANTS   Lambs Quarters    lamb   Milk-related Compounds Other (See Comments)   Congestion and sneezing   Mustard [allyl Isothiocyanate] Other (See Comments)   Congestion and sneezing   Other Other (See Comments)   Other reaction(s): Unknown Uncoded Allergy. Allergen: ANTICONVULSANTS dizziness Other reaction(s): Unknown Uncoded Allergy. Allergen: ALCOHOL COUGH MEDICINE Other reaction(s): Unknown Numerous food allergies A lot of preservatives and medicine ingredients   Pineapple Other (See Comments)   Congestion and sneezing   Plum Pulp Other (See Comments)   Congestion and sneezing   Raspberry Other (See Comments)   Congestion and sneezing  Red Dye Other (See Comments)   #40 Congestion and sneezing   Salicylic Acid    Other reaction(s): Other (See Comments) Many issues   Scallops [shellfish Allergy] Other (See Comments)   Congestion and sneezing   Tea Other (See Comments)   Congestion and sneezing   Vanilla Other (See Comments)   Congestion and sneezing   Whey Other (See Comments)   Congestion and sneezing      Medication  List       Accurate as of December 09, 2019 11:38 AM. If you have any questions, ask your nurse or doctor.        clonazePAM 0.5 MG tablet Commonly known as: KLONOPIN Take 1 tablet (0.5 mg total) 2 (two) times daily by mouth.   cloNIDine 0.2 MG tablet Commonly known as: CATAPRES Take 0.2 tablets by mouth 2 (two) times daily.   finasteride 5 MG tablet Commonly known as: PROSCAR TAKE 1 TABLET(5 MG) BY MOUTH DAILY   lisinopril 40 MG tablet Commonly known as: ZESTRIL Take 40 mg by mouth daily.   NETI POT SINUS [place] NA Place 1 Applicatorful into the nose daily.   torsemide 10 MG tablet Commonly known as: DEMADEX Take 10 mg by mouth daily.   vitamin C 500 MG tablet Commonly known as: ASCORBIC ACID Take 500 mg by mouth daily.       Allergies:  Allergies  Allergen Reactions  . Almond Oil Shortness Of Breath  . Corn Oil Shortness Of Breath  . Dextroamphetamine Shortness Of Breath    dizziness  . Dust Mite Mixed Allergen Ext [Mite (D. Farinae)] Shortness Of Breath  . Salicylates Shortness Of Breath and Other (See Comments)    Other reaction(s): Other (See Comments), Syncope  . Amlodipine     Other reaction(s): Other (See Comments) Other Reaction: PATIENT STATES HE CANNOT TOLER dizziness  . Amphetamines     dizziness  . Aspirin     Other reaction(s): Unknown dizziness  . Beet [Beta Vulgaris] Other (See Comments)    Congestion and sneezing  . Cantaloupe (Diagnostic) Other (See Comments)    Congestion and sneezing  . Carrot [Daucus Carota] Other (See Comments)    Congestion and sneezing  . Cheese     American cheddar  . Corn-Containing Products Other (See Comments)    Congestion and sneezing  . Felodipine     Other reaction(s): Other (See Comments) Other Reaction: PATIENT STATES HE CANNOT TOLER dizziness  . Fructose Other (See Comments)    Congestion and sneezing  . Green Dyes Other (See Comments)    #3 Congestion and sneezing  . Indapamide     Other  reaction(s): Other (See Comments) Other Reaction: PATIENT STATES HE CANNOT TOLER dizziness  . Iodinated Diagnostic Agents Other (See Comments)    Other reaction(s): Unknown Uncoded Allergy. Allergen: Preservatives, Other Reaction: HTN CRISIS Uncoded Allergy. Allergen: ANTICONVULSANTS Uncoded Allergy. Allergen: Preservatives, Other Reaction: HTN CRISIS Uncoded Allergy. Allergen: ANTICONVULSANTS  . Lambs Quarters     lamb  . Milk-Related Compounds Other (See Comments)    Congestion and sneezing  . Madelaine Bhat Isothiocyanate] Other (See Comments)    Congestion and sneezing  . Other Other (See Comments)    Other reaction(s): Unknown Uncoded Allergy. Allergen: ANTICONVULSANTS dizziness Other reaction(s): Unknown Uncoded Allergy. Allergen: ALCOHOL COUGH MEDICINE Other reaction(s): Unknown Numerous food allergies A lot of preservatives and medicine ingredients  . Pineapple Other (See Comments)    Congestion and sneezing  . Plum Pulp Other (See Comments)  Congestion and sneezing  . Raspberry Other (See Comments)    Congestion and sneezing  . Red Dye Other (See Comments)    #40 Congestion and sneezing  . Salicylic Acid     Other reaction(s): Other (See Comments) Many issues  . Scallops [Shellfish Allergy] Other (See Comments)    Congestion and sneezing  . Tea Other (See Comments)    Congestion and sneezing  . Vanilla Other (See Comments)    Congestion and sneezing  . Whey Other (See Comments)    Congestion and sneezing    Family History: Family History  Problem Relation Age of Onset  . Dementia Mother   . Heart disease Father   . Parkinson's disease Sister   . Diabetes Sister   . Pancreatic cancer Sister   . Leukemia Sister     Social History:  reports that he has never smoked. He has never used smokeless tobacco. He reports that he does not drink alcohol and does not use drugs.   Physical Exam: There were no vitals taken for this visit.  Constitutional:   Alert and oriented, No acute distress. HEENT: [place] AT, moist mucus membranes.  Trachea midline, no masses. Cardiovascular: No clubbing, cyanosis, or edema. Respiratory: Normal respiratory effort, no increased work of breathing. GI: Abdomen is soft, nontender, nondistended, no abdominal masses GU: No CVA tenderness Lymph: No cervical or inguinal lymphadenopathy. Skin: No rashes, bruises or suspicious lesions. Neurologic: Grossly intact, no focal deficits, moving all 4 extremities. Psychiatric: Normal mood and affect.  Laboratory Data:  Urinalysis   Pertinent Imaging: ***  CT HEMATURIA WORKUP   Assessment & Plan:     No follow-ups on file.  [facility] [address] [place], [place] [address] [phone]  I, Lucas Mallow, am acting as a scribe for Dr. Hollice Espy,  {Add Scribe Attestation Statement}

## 2019-12-10 ENCOUNTER — Encounter: Payer: Self-pay | Admitting: Urology

## 2019-12-10 ENCOUNTER — Ambulatory Visit: Payer: Medicare Other | Admitting: Urology

## 2020-03-15 ENCOUNTER — Encounter (HOSPITAL_COMMUNITY): Payer: Self-pay

## 2020-03-15 ENCOUNTER — Emergency Department (HOSPITAL_COMMUNITY)
Admission: EM | Admit: 2020-03-15 | Discharge: 2020-03-15 | Disposition: A | Discharge status: Home / Self Care | Payer: Medicare Other | Attending: Emergency Medicine | Admitting: Emergency Medicine

## 2020-03-15 DIAGNOSIS — Z79899 Other long term (current) drug therapy: Secondary | ICD-10-CM | POA: Insufficient documentation

## 2020-03-15 DIAGNOSIS — R569 Unspecified convulsions: Secondary | ICD-10-CM | POA: Diagnosis not present

## 2020-03-15 DIAGNOSIS — I129 Hypertensive chronic kidney disease with stage 1 through stage 4 chronic kidney disease, or unspecified chronic kidney disease: Secondary | ICD-10-CM | POA: Diagnosis not present

## 2020-03-15 DIAGNOSIS — N183 Chronic kidney disease, stage 3 unspecified: Secondary | ICD-10-CM | POA: Insufficient documentation

## 2020-03-15 LAB — CBC WITH DIFFERENTIAL/PLATELET
Abs Immature Granulocytes: 0.1 10*3/uL — ABNORMAL HIGH (ref 0.00–0.07)
Basophils Absolute: 0.1 10*3/uL (ref 0.0–0.1)
Basophils Relative: 0 %
Eosinophils Absolute: 0.1 10*3/uL (ref 0.0–0.5)
Eosinophils Relative: 1 %
HCT: 47.4 % (ref 39.0–52.0)
Hemoglobin: 15.2 g/dL (ref 13.0–17.0)
Immature Granulocytes: 1 %
Lymphocytes Relative: 4 %
Lymphs Abs: 0.6 10*3/uL — ABNORMAL LOW (ref 0.7–4.0)
MCH: 26.8 pg (ref 26.0–34.0)
MCHC: 32.1 g/dL (ref 30.0–36.0)
MCV: 83.5 fL (ref 80.0–100.0)
Monocytes Absolute: 1.1 10*3/uL — ABNORMAL HIGH (ref 0.1–1.0)
Monocytes Relative: 8 %
Neutro Abs: 11.7 10*3/uL — ABNORMAL HIGH (ref 1.7–7.7)
Neutrophils Relative %: 86 %
Platelets: 192 10*3/uL (ref 150–400)
RBC: 5.68 MIL/uL (ref 4.22–5.81)
RDW: 13.7 % (ref 11.5–15.5)
WBC: 13.6 10*3/uL — ABNORMAL HIGH (ref 4.0–10.5)
nRBC: 0 % (ref 0.0–0.2)

## 2020-03-15 LAB — COMPREHENSIVE METABOLIC PANEL
ALT: 24 U/L (ref 0–44)
AST: 34 U/L (ref 15–41)
Albumin: 3.9 g/dL (ref 3.5–5.0)
Alkaline Phosphatase: 71 U/L (ref 38–126)
Anion gap: 13 (ref 5–15)
BUN: 19 mg/dL (ref 8–23)
CO2: 21 mmol/L — ABNORMAL LOW (ref 22–32)
Calcium: 8.9 mg/dL (ref 8.9–10.3)
Chloride: 105 mmol/L (ref 98–111)
Creatinine, Ser: 1.45 mg/dL — ABNORMAL HIGH (ref 0.61–1.24)
GFR calc Af Amer: 56 mL/min — ABNORMAL LOW (ref >60)
GFR calc non Af Amer: 48 mL/min — ABNORMAL LOW (ref >60)
Glucose, Bld: 112 mg/dL — ABNORMAL HIGH (ref 70–99)
Potassium: 3.6 mmol/L (ref 3.5–5.1)
Sodium: 139 mmol/L (ref 135–145)
Total Bilirubin: 0.7 mg/dL (ref 0.3–1.2)
Total Protein: 7.1 g/dL (ref 6.5–8.1)

## 2020-03-15 LAB — URINALYSIS, ROUTINE W REFLEX MICROSCOPIC
Bilirubin Urine: NEGATIVE
Glucose, UA: NEGATIVE mg/dL
Hgb urine dipstick: NEGATIVE
Ketones, ur: NEGATIVE mg/dL
Leukocytes,Ua: NEGATIVE
Nitrite: NEGATIVE
Protein, ur: 100 mg/dL — AB
Specific Gravity, Urine: 1.009 (ref 1.005–1.030)
pH: 7 (ref 5.0–8.0)

## 2020-03-15 NOTE — ED Notes (Signed)
This nurse called wife to pick pt up at ED

## 2020-03-15 NOTE — Discharge Instructions (Signed)
Your urine test was normal. Please follow with your primary care provider regarding your visit today.

## 2020-03-15 NOTE — ED Notes (Signed)
[phone] Wifes number. Please call for updates.

## 2020-03-15 NOTE — ED Notes (Signed)
BP elevated, [place] PA made aware.   PA in to talk with patient.  Pt will take Clonidine when he gets home.

## 2020-03-15 NOTE — ED Triage Notes (Signed)
Per ems pt coming from home. Pt has hx of seizures. Patient hasnt been compliant with his med's for the last 3 years. He normally has 1-2 seizures a week. Per wife its normally only partial seizures that last about 30-40seconds. Today the patient had 1 seizure at 10am that was his normal partial seizure and then had a grand mal seizure that lasted 15 min this afternoon. When fire arrived patient was still actively seizing.  Pt did fall off the bed but per ems no obvious trauma and unsure if he hit his head because it was unwitnessed. Pt is incontinent. Pt is post ictal on arrival. Pt is alert. Pt was tachy upon arrival in the 130s but is now 108. bp 164/100. 96% RA, RR16. 165 cbg.

## 2020-03-15 NOTE — ED Provider Notes (Signed)
[facility] EMERGENCY DEPARTMENT Provider Note   CSN: [id number] Arrival date & time: 03/15/20  1803     History Chief Complaint  Patient presents with  . Seizures    Noah Rose is a 69 y.o. male.  HPI He presents for evaluation of a seizure that lasted "15 minutes," by report of EMS.  Patient has known seizure disorder but does not take seizure medications.  He has no neurocognitive dysfunction, last evaluated by geriatrician on 11/24/2018.  At that time he was offered more comprehensive testing but declined.  He denies recent illnesses.  He denies injury from episode today.  There are no other known modifying factors     Past Medical History:  Diagnosis Date  . Anxiety   . Dizziness   . High blood pressure   . Hypertension   . Multiple allergies   . Seizures [facility])     Patient Active Problem List   Diagnosis Date Noted  . Post-ictal state ([place]) 04/29/2017  . CKD (chronic kidney disease), stage III 01/11/2017  . History of nonadherence to medical treatment 06/08/2016  . BPH (benign prostatic hyperplasia) 04/24/2016  . Delirium   . Hyponatremia 04/23/2016  . Microscopic hematuria 04/23/2016  . Urinary retention: Probable 03/17/2016  . Sinusitis, acute, maxillary: Right 03/16/2016  . Convulsions ([place]) 03/16/2016  . Facial fracture ([place]) 03/15/2016  . Leukocytosis 03/15/2016  . Hypertensive urgency   . Orbital fracture ([place])   . Seizure ([place])   . Spells 06/03/2015  . Medication intolerance 04/03/2014  . Hypoglycemia 11/26/2013  . 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 Nasal turbinate hypertrophy 09/25/2013  . Nuclear sclerosis 05/08/2013  . Nasal congestion 04/15/2013  . Nocturia 04/02/2013  . Increased frequency of urination 04/02/2013  . Complex partial seizure ([place]) 11/05/2012  . Alteration consciousness 09/05/2012  . Syncope and collapse 07/28/2012  . Hypertension 07/28/2012  . Obstructive sleep apnea 07/28/2012  . Allergic  rhinitis 07/28/2012  . Chronic anxiety 07/28/2012  . 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 Anxiety disorder 07/28/2012  . Dizziness 05/07/2012  . Breathing problem 01/21/2011  . Salicylate allergy 40/98/1191    Past Surgical History:  Procedure Laterality Date  . HERNIA REPAIR  02/2016       Family History  Problem Relation Age of Onset  . Dementia Mother   . Heart disease Father   . Parkinson's disease Sister   . Diabetes Sister   . Pancreatic cancer Sister   . Leukemia Sister     Social History   Tobacco Use  . Smoking status: Never Smoker  . Smokeless tobacco: Never Used  Vaping Use  . Vaping Use: Never used  Substance Use Topics  . Alcohol use: No  . Drug use: No    Home Medications Prior to Admission medications   Medication Sig Start Date End Date Taking? Authorizing Provider  clonazePAM (KLONOPIN) 0.5 MG tablet Take 1 tablet (0.5 mg total) 2 (two) times daily by mouth. 04/30/17   Doreatha Lew, MD  cloNIDine (CATAPRES) 0.2 MG tablet Take 0.2 tablets by mouth 2 (two) times daily. 03/01/17   [provider]  finasteride (PROSCAR) 5 MG tablet TAKE 1 TABLET(5 MG) BY MOUTH DAILY 01/04/19   Hollice Espy, MD  lisinopril (PRINIVIL,ZESTRIL) 40 MG tablet Take 40 mg by mouth daily. 08/02/17   [provider]  Sodium Chloride-Sodium Bicarb (NETI POT SINUS Leslie NA) Place 1 Applicatorful  into the nose daily.    [provider]  torsemide (DEMADEX) 10 MG tablet Take 10 mg by mouth daily.    [provider]  vitamin C (ASCORBIC ACID) 500 MG tablet Take 500 mg by mouth daily.    [provider]    Allergies    Almond oil, Corn oil, Dextroamphetamine, Dust mite mixed allergen ext [mite (d. farinae)], Salicylates, Amlodipine, Amphetamines, Aspirin, Beet [beta vulgaris], Cantaloupe (diagnostic), Carrot [daucus carota], Cheese, Corn-containing  products, Felodipine, Fructose, Green dyes, Indapamide, Iodinated diagnostic agents, Lambs quarters, Milk-related compounds, Mustard [allyl isothiocyanate], Other, Pineapple, Plum pulp, Raspberry, Red dye, Salicylic acid, Scallops [shellfish allergy], Tea, Vanilla, and Whey  Review of Systems   Review of Systems  Physical Exam Updated Vital Signs BP (!) 146/80 (BP Location: Right Arm)   Pulse (!) 114   Resp 16   SpO2 97%   Physical Exam  ED Results / Procedures / Treatments   Labs (all labs ordered are listed, but only abnormal results are displayed) Labs Reviewed  COMPREHENSIVE METABOLIC PANEL - Abnormal; Notable for the following components:      Result Value   CO2 21 (*)    Glucose, Bld 112 (*)    Creatinine, Ser 1.45 (*)    GFR calc non Af Amer 48 (*)    GFR calc Af Amer 56 (*)    All other components within normal limits  CBC WITH DIFFERENTIAL/PLATELET - Abnormal; Notable for the following components:   WBC 13.6 (*)    Neutro Abs 11.7 (*)    Lymphs Abs 0.6 (*)    Monocytes Absolute 1.1 (*)    Abs Immature Granulocytes 0.10 (*)    All other components within normal limits  URINALYSIS, ROUTINE W REFLEX MICROSCOPIC    EKG None  Radiology No results found.  Procedures Procedures (including critical care time)  Medications Ordered in ED Medications - No data to display  ED Course  I have reviewed the triage vital signs and the nursing notes.  Pertinent labs & imaging results that were available during my care of the patient were reviewed by me and considered in my medical decision making (see chart for details).  Clinical Course as of Mar 15 2048  Nancy Fetter Mar 15, 2020  2039 He had partial sz at 10:30. Later he was in his room and fell. She heard him fall and found him unresponsive and foaming at mouth. Similar 9 months ago. He declines sz meds.  He states he would rather not take medicines because "they do not help."  Patient's wife understands that no additional  treatment will be required since the patient is choosing not to take seizure medicines.  She will be happy to come and get him after the work-up is complete.  At this point all we are waiting on is a is  urinalysis.   [EW]    Clinical Course User Index [EW] Daleen Bo, MD   MDM Rules/Calculators/A&P                           Patient Vitals for the past 24 hrs:  BP Pulse Resp SpO2  03/15/20 1817 (!) 146/80 (!) 114 16 97 %    Medical Decision Making:  This patient is presenting for evaluation of seizure, which does require a range of treatment options, and is a complaint that involves a moderate risk of morbidity and mortality. The differential diagnoses include seizure, acute illness, syncope. I  decided to review old records, and in summary patient with known seizure disorder, likely had a seizure today based on wife's report.  Patient has refused all antiepileptic medicine in the past.  He presents lucid and cooperative.  I obtained additional historical information from his wife by telephone.  Clinical Laboratory Tests Ordered, included CBC, Metabolic panel and Urinalysis. Review indicates normal CBC and metabolic panel.   Critical Interventions-clinical evaluation, laboratory testing, observation reassessment  After These Interventions, the Patient was reevaluated and was found with reassuring initial evaluation.  Urinalysis pending at the time of shift change.  Anticipate discharge regardless of urinalysis result.  Patient has refused antiepileptics.  No indication for hospitalization at this time.  CRITICAL CARE-no Performed by: Daleen Bo  Nursing Notes Reviewed/ Care Coordinated Applicable Imaging Reviewed Interpretation of Laboratory Data incorporated into ED treatment  Care to [facility] to disposition after return of urinalysis.    Final Clinical Impression(s) / ED Diagnoses Final diagnoses:  Seizure [facility])    Rx / [place] Orders ED Discharge Orders    None        Daleen Bo, MD 03/24/20 [phone]

## 2020-03-15 NOTE — ED Provider Notes (Signed)
Care assumed briefly at shift change from Dr. Eulis Foster. See his note for full HPI and work-up. Briefly, patient has known seizure disorder, refuses any antiepileptic medications. Patient had seizures today that were unwitnessed. No evidence of trauma on exam, he is at his baseline. Work-up is unremarkable. Plan to follow U/A, treat UTI if needed. Plan for discharge with outpatient follow up. Patient's wife has already been updated. Physical Exam  BP (!) 146/80 (BP Location: Right Arm)   Pulse (!) 114   Resp 16   SpO2 97%   Physical Exam Vitals and nursing note reviewed.  Constitutional:      General: He is not in acute distress.    Appearance: He is well-developed.  HENT:     Head: Normocephalic and atraumatic.  Eyes:     Conjunctiva/sclera: Conjunctivae normal.  Cardiovascular:     Rate and Rhythm: Normal rate.  Pulmonary:     Effort: Pulmonary effort is normal.  Abdominal:     Palpations: Abdomen is soft.  Skin:    General: Skin is warm.  Neurological:     Mental Status: He is alert.  Psychiatric:        Behavior: Behavior normal.     ED Course/Procedures   Clinical Course as of Mar 15 2046  Nancy Fetter Mar 15, 2020  2039 He had partial sz at 10:30. Later he was in his room and fell. She heard him fall and found him unresponsive and foaming at mouth. Similar 9 months ago. He declines sz meds.  He states he would rather not take medicines because "they do not help."  Patient's wife understands that no additional treatment will be required since the patient is choosing not to take seizure medicines.  She will be happy to come and get him after the work-up is complete.  At this point all we are waiting on is a is  urinalysis.   [EW]    Clinical Course User Index [EW] Daleen Bo, MD    Procedures Results for orders placed or performed during the hospital encounter of 03/15/20  Comprehensive metabolic panel  Result Value Ref Range   Sodium 139 135 - 145 mmol/L   Potassium 3.6 3.5  - 5.1 mmol/L   Chloride 105 98 - 111 mmol/L   CO2 21 (L) 22 - 32 mmol/L   Glucose, Bld 112 (H) 70 - 99 mg/dL   BUN 19 8 - 23 mg/dL   Creatinine, Ser 1.45 (H) 0.61 - 1.24 mg/dL   Calcium 8.9 8.9 - 10.3 mg/dL   Total Protein 7.1 6.5 - 8.1 g/dL   Albumin 3.9 3.5 - 5.0 g/dL   AST 34 15 - 41 U/L   ALT 24 0 - 44 U/L   Alkaline Phosphatase 71 38 - 126 U/L   Total Bilirubin 0.7 0.3 - 1.2 mg/dL   GFR calc non Af Amer 48 (L) >60 mL/min   GFR calc Af Amer 56 (L) >60 mL/min   Anion gap 13 5 - 15  CBC with Differential  Result Value Ref Range   WBC 13.6 (H) 4.0 - 10.5 K/uL   RBC 5.68 4.22 - 5.81 MIL/uL   Hemoglobin 15.2 13.0 - 17.0 g/dL   HCT 47.4 39 - 52 %   MCV 83.5 80.0 - 100.0 fL   MCH 26.8 26.0 - 34.0 pg   MCHC 32.1 30.0 - 36.0 g/dL   RDW 13.7 11.5 - 15.5 %   Platelets 192 150 - 400 K/uL   nRBC 0.0  0.0 - 0.2 %   Neutrophils Relative % 86 %   Neutro Abs 11.7 (H) 1.7 - 7.7 K/uL   Lymphocytes Relative 4 %   Lymphs Abs 0.6 (L) 0.7 - 4.0 K/uL   Monocytes Relative 8 %   Monocytes Absolute 1.1 (H) 0 - 1 K/uL   Eosinophils Relative 1 %   Eosinophils Absolute 0.1 0 - 0 K/uL   Basophils Relative 0 %   Basophils Absolute 0.1 0 - 0 K/uL   Immature Granulocytes 1 %   Abs Immature Granulocytes 0.10 (H) 0.00 - 0.07 K/uL  Urinalysis, Routine w reflex microscopic Urine, Clean Catch  Result Value Ref Range   Color, Urine STRAW (A) YELLOW   APPearance CLEAR CLEAR   Specific Gravity, Urine 1.009 1.005 - 1.030   pH 7.0 5.0 - 8.0   Glucose, UA NEGATIVE NEGATIVE mg/dL   Hgb urine dipstick NEGATIVE NEGATIVE   Bilirubin Urine NEGATIVE NEGATIVE   Ketones, ur NEGATIVE NEGATIVE mg/dL   Protein, ur 100 (A) NEGATIVE mg/dL   Nitrite NEGATIVE NEGATIVE   Leukocytes,Ua NEGATIVE NEGATIVE   RBC / HPF 0-5 0 - 5 RBC/hpf   WBC, UA 0-5 0 - 5 WBC/hpf   Bacteria, UA RARE (A) NONE SEEN   Mucus PRESENT    Hyaline Casts, UA PRESENT    No results found.  MDM   UA is unremarkable. Patient noted to be  hypertensive at discharge. He states he did not take his evening dose of clonidine. Offered dose here, patient states he will take when he returns home so he can have a meal with his medications. Patient discharged in no distress, at baseline.Quentin Cornwall, [place] N, PA-C 03/15/20 2209    Daleen Bo, MD 03/24/20 [phone]

## 2020-03-31 ENCOUNTER — Ambulatory Visit: Payer: Medicare Other | Attending: Internal Medicine

## 2020-03-31 DIAGNOSIS — Z23 Encounter for immunization: Secondary | ICD-10-CM

## 2020-03-31 NOTE — Progress Notes (Signed)
   Covid-19 Vaccination Clinic  Name:  HIMMAT ENBERG    MRN: [id number] DOB: 12-23-1950  03/31/2020  Mr. Pella was observed post Covid-19 immunization for 15 minutes without incident. He was provided with Vaccine Information Sheet and instruction to access the V-Safe system.   Mr. Mark was instructed to call 911 with any severe reactions post vaccine: Marland Kitchen Difficulty breathing  . Swelling of face and throat  . A fast heartbeat  . A bad rash all over body  . Dizziness and weakness

## 2020-06-27 DEATH — deceased
